# Patient Record
Sex: Male | Born: 1935 | Race: White | Hispanic: No | Smoking: Never smoker
Health system: Southern US, Community
[De-identification: ages and names within clinical notes are randomized; demographics above are authoritative.]

## PROBLEM LIST (undated history)

## (undated) DIAGNOSIS — C801 Malignant (primary) neoplasm, unspecified: Secondary | ICD-10-CM

## (undated) DIAGNOSIS — C439 Malignant melanoma of skin, unspecified: Secondary | ICD-10-CM

## (undated) HISTORY — PX: MELANOMA EXCISION: SHX5266

## (undated) HISTORY — PX: HERNIA REPAIR: SHX51

---
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
## 1998-01-11 HISTORY — PX: CATARACT EXTRACTION: SUR2

## 2003-03-21 ENCOUNTER — Other Ambulatory Visit: Payer: Self-pay

## 2004-02-04 ENCOUNTER — Ambulatory Visit: Payer: Self-pay | Admitting: Internal Medicine

## 2009-05-05 ENCOUNTER — Ambulatory Visit: Payer: Self-pay | Admitting: Urology

## 2010-11-07 ENCOUNTER — Emergency Department: Payer: Self-pay | Admitting: Emergency Medicine

## 2010-11-20 ENCOUNTER — Inpatient Hospital Stay: Payer: Self-pay | Admitting: Student

## 2010-11-27 LAB — PATHOLOGY REPORT

## 2014-09-30 DIAGNOSIS — Z85828 Personal history of other malignant neoplasm of skin: Secondary | ICD-10-CM | POA: Insufficient documentation

## 2014-11-21 DIAGNOSIS — K219 Gastro-esophageal reflux disease without esophagitis: Secondary | ICD-10-CM | POA: Insufficient documentation

## 2014-11-21 DIAGNOSIS — E785 Hyperlipidemia, unspecified: Secondary | ICD-10-CM | POA: Insufficient documentation

## 2018-05-10 ENCOUNTER — Emergency Department: Payer: Medicare Other

## 2018-05-10 ENCOUNTER — Other Ambulatory Visit: Payer: Self-pay

## 2018-05-10 ENCOUNTER — Emergency Department
Admission: EM | Admit: 2018-05-10 | Discharge: 2018-05-10 | Disposition: A | Discharge status: Home / Self Care | Payer: Medicare Other | Attending: Emergency Medicine | Admitting: Emergency Medicine

## 2018-05-10 ENCOUNTER — Encounter: Payer: Self-pay | Admitting: Emergency Medicine

## 2018-05-10 DIAGNOSIS — K5900 Constipation, unspecified: Secondary | ICD-10-CM | POA: Insufficient documentation

## 2018-05-10 DIAGNOSIS — R109 Unspecified abdominal pain: Secondary | ICD-10-CM

## 2018-05-10 DIAGNOSIS — Z85820 Personal history of malignant melanoma of skin: Secondary | ICD-10-CM | POA: Diagnosis not present

## 2018-05-10 DIAGNOSIS — I493 Ventricular premature depolarization: Secondary | ICD-10-CM | POA: Diagnosis not present

## 2018-05-10 LAB — URINALYSIS, COMPLETE (UACMP) WITH MICROSCOPIC
Bacteria, UA: NONE SEEN
Bilirubin Urine: NEGATIVE
Glucose, UA: NEGATIVE mg/dL
Ketones, ur: NEGATIVE mg/dL
Leukocytes,Ua: NEGATIVE
Nitrite: NEGATIVE
Protein, ur: NEGATIVE mg/dL
Specific Gravity, Urine: 1.016 (ref 1.005–1.030)
Squamous Epithelial / HPF: NONE SEEN (ref 0–5)
pH: 6 (ref 5.0–8.0)

## 2018-05-10 LAB — COMPREHENSIVE METABOLIC PANEL
ALT: 16 U/L (ref 0–44)
AST: 24 U/L (ref 15–41)
Albumin: 4.2 g/dL (ref 3.5–5.0)
Alkaline Phosphatase: 89 U/L (ref 38–126)
Anion gap: 9 (ref 5–15)
BUN: 20 mg/dL (ref 8–23)
CO2: 24 mmol/L (ref 22–32)
Calcium: 9 mg/dL (ref 8.9–10.3)
Chloride: 104 mmol/L (ref 98–111)
Creatinine, Ser: 1.51 mg/dL — ABNORMAL HIGH (ref 0.61–1.24)
GFR calc Af Amer: 46 mL/min — ABNORMAL LOW (ref >60)
GFR calc non Af Amer: 40 mL/min — ABNORMAL LOW (ref >60)
Glucose, Bld: 115 mg/dL — ABNORMAL HIGH (ref 70–99)
Potassium: 4.1 mmol/L (ref 3.5–5.1)
Sodium: 137 mmol/L (ref 135–145)
Total Bilirubin: 0.9 mg/dL (ref 0.3–1.2)
Total Protein: 7.2 g/dL (ref 6.5–8.1)

## 2018-05-10 LAB — CBC WITH DIFFERENTIAL/PLATELET
Abs Immature Granulocytes: 0.02 10*3/uL (ref 0.00–0.07)
Basophils Absolute: 0 10*3/uL (ref 0.0–0.1)
Basophils Relative: 0 %
Eosinophils Absolute: 0.1 10*3/uL (ref 0.0–0.5)
Eosinophils Relative: 1 %
HCT: 45.9 % (ref 39.0–52.0)
Hemoglobin: 15 g/dL (ref 13.0–17.0)
Immature Granulocytes: 0 %
Lymphocytes Relative: 39 %
Lymphs Abs: 3.7 10*3/uL (ref 0.7–4.0)
MCH: 31.1 pg (ref 26.0–34.0)
MCHC: 32.7 g/dL (ref 30.0–36.0)
MCV: 95 fL (ref 80.0–100.0)
Monocytes Absolute: 0.7 10*3/uL (ref 0.1–1.0)
Monocytes Relative: 7 %
Neutro Abs: 5.1 10*3/uL (ref 1.7–7.7)
Neutrophils Relative %: 53 %
Platelets: 210 10*3/uL (ref 150–400)
RBC: 4.83 MIL/uL (ref 4.22–5.81)
RDW: 12.4 % (ref 11.5–15.5)
WBC: 9.6 10*3/uL (ref 4.0–10.5)
nRBC: 0 % (ref 0.0–0.2)

## 2018-05-10 LAB — LIPASE, BLOOD: Lipase: 46 U/L (ref 11–51)

## 2018-05-10 LAB — TROPONIN I: Troponin I: <0.03 ng/mL (ref <0.03)

## 2018-05-10 MED ORDER — SODIUM CHLORIDE 0.9 % IV BOLUS
500.0000 mL | Freq: Once | INTRAVENOUS | Status: AC
  Administered 2018-05-10: 16:00:00 500 mL via INTRAVENOUS

## 2018-05-10 MED ORDER — MAGNESIUM SULFATE 2 GM/50ML IV SOLN
2.0000 g | Freq: Once | INTRAVENOUS | Status: AC
  Administered 2018-05-10: 16:00:00 2 g via INTRAVENOUS
  Filled 2018-05-10: qty 50

## 2018-05-10 NOTE — ED Provider Notes (Signed)
[facility] Emergency Department Provider Note  ____________________________________________  Time seen: Approximately 4:03 PM  I have reviewed the triage vital signs and the nursing notes.   HISTORY  Chief Complaint Abdominal Pain   HPI Omar Schroeder is a 85 y.o. male with a history of GERD, hyperlipidemia, melanoma who presents for evaluation of abdominal pain.  Patient describes a sharp intermittent lower abdominal pain in the suprapubic region for 3 days.  He reports that the pain was not improving which prompted her visit to the emergency room.  At this time he has no pain.  He reports that the pain feels like gas.  He denies constipation, diarrhea, nausea, vomiting, dysuria, hematuria, fever, chills.  Denies any abdominal surgeries other than inguinal hernia repair.  No testicular pain or swelling.  No chest pain or shortness of breath.  PMH HLD GERD Melanoma  Past Surgical History:  Procedure Laterality Date  . HERNIA REPAIR    . MELANOMA EXCISION      Allergies Patient has no known allergies.  FH Coronary Artery Disease (Blocked arteries around heart) Father    Breast cancer Mother       Social History Social History   Tobacco Use  . Smoking status: Never Smoker  . Smokeless tobacco: Never Used  Substance Use Topics  . Alcohol use: Never    Frequency: Never  . Drug use: Not on file    Review of Systems  Constitutional: Negative for fever. Eyes: Negative for visual changes. ENT: Negative for sore throat. Neck: No neck pain  Cardiovascular: Negative for chest pain. Respiratory: Negative for shortness of breath. Gastrointestinal: + Suprapubic abdominal pain. No vomiting or diarrhea. Genitourinary: Negative for dysuria. Musculoskeletal: Negative for back pain. Skin: Negative for rash. Neurological: Negative for headaches, weakness or numbness. Psych: No SI or HI  ____________________________________________    PHYSICAL EXAM:  VITAL SIGNS: ED Triage Vitals  Enc Vitals Group     BP 05/10/18 1444 111/76     Pulse Rate 05/10/18 1444 75     Resp 05/10/18 1444 18     Temp 05/10/18 1444 97.8 F (36.6 C)     Temp Source 05/10/18 1444 Oral     SpO2 05/10/18 1444 99 %     Weight 05/10/18 1445 150 lb (68 kg)     Height 05/10/18 1445 5\' 9"  (1.753 m)     Head Circumference --      Peak Flow --      Pain Score 05/10/18 1438 0     Pain Loc --      Pain Edu? --      Excl. in [place]? --     Constitutional: Alert and oriented. Well appearing and in no apparent distress. HEENT:      Head: Normocephalic and atraumatic.         Eyes: Conjunctivae are normal. Sclera is non-icteric.       Mouth/Throat: Mucous membranes are moist.       Neck: Supple with no signs of meningismus. Cardiovascular: Regular rate and rhythm. No murmurs, gallops, or rubs. 2+ symmetrical distal pulses are present in all extremities. No JVD. Respiratory: Normal respiratory effort. Lungs are clear to auscultation bilaterally. No wheezes, crackles, or rhonchi.  Gastrointestinal: Soft, non tender, and non distended with positive bowel sounds. No rebound or guarding. Genitourinary: No CVA tenderness. Bilateral testicles are descended with no tenderness to palpation, bilateral positive cremasteric reflexes are present, no swelling or erythema of the scrotum. No  evidence of inguinal hernia. Musculoskeletal: Nontender with normal range of motion in all extremities. No edema, cyanosis, or erythema of extremities. Neurologic: Normal speech and language. Face is symmetric. Moving all extremities. No gross focal neurologic deficits are appreciated. Skin: Skin is warm, dry and intact. No rash noted. Psychiatric: Mood and affect are normal. Speech and behavior are normal.  ____________________________________________   LABS (all labs ordered are listed, but only abnormal results are displayed)  Labs Reviewed  COMPREHENSIVE METABOLIC PANEL -  Abnormal; Notable for the following components:      Result Value   Glucose, Bld 115 (*)    Creatinine, Ser 1.51 (*)    GFR calc non Af Amer 40 (*)    GFR calc Af Amer 46 (*)    All other components within normal limits  URINALYSIS, COMPLETE (UACMP) WITH MICROSCOPIC - Abnormal; Notable for the following components:   Color, Urine YELLOW (*)    APPearance CLEAR (*)    Hgb urine dipstick SMALL (*)    All other components within normal limits  LIPASE, BLOOD  CBC WITH DIFFERENTIAL/PLATELET  TROPONIN I   ____________________________________________  EKG  ED ECG REPORT I, Rudene Re, the attending physician, personally viewed and interpreted this ECG.  Normal sinus rhythm with frequent PVCs and bigeminy pattern, LAFB, left axis deviation, no ST elevations or depressions.   17:06 -normal sinus rhythm, rate of 91, normal intervals, normal axis, frequent PVCs, no ST elevations or depressions.  Unchanged from initial.  ____________________________________________  RADIOLOGY  I have personally reviewed the images performed during this visit and I agree with the Radiologist's read.   Interpretation by Radiologist:  Dg Abdomen 1 View  Result Date: 05/10/2018 CLINICAL DATA:  Patient reports mid and lower abd pain x 3 days, denies d/n/v, just reports pain. EXAM: ABDOMEN - 1 VIEW COMPARISON:  CT 11/07/2010 FINDINGS: Small bowel decompressed. Moderate colonic fecal material without dilatation. Right pelvic phleboliths. Possible T12 compression deformity, which was not evident on the prior study. IMPRESSION: 1. Nonobstructive bowel gas pattern with moderate colonic fecal material. 2. T12 compression deformity, new since 2012 Electronically Signed   By: Lucrezia Europe M.D.   On: 05/10/2018 16:02     ____________________________________________   PROCEDURES  Procedure(s) performed: None Procedures Critical Care performed:  None ____________________________________________   INITIAL  IMPRESSION / ASSESSMENT AND PLAN / ED COURSE  85 y.o. male with a history of GERD, hyperlipidemia, melanoma who presents for evaluation of intermittent "gas-like" suprapubic abdominal pain x 3 days. No pain at this time.  Patient is well-appearing and in no distress, has normal vital signs, abdomen is soft with no tenderness throughout, normal GU exam.  EKG showing bigeminy PVCs but no evidence of ischemia.  Patient denies any chest pain however troponin was sent to evaluate for ischemia and that is negative.  Magnesium IV was given to help with PVCs, repeat EKG after mag with no changes. Patient asymptomatic, will refer to cardiology.  CBC showing no leukocytosis.  CMP shows stable creatinine at 1.5 with normal electrolytes, normal liver enzymes, normal lipase. UA showed no evidence of UTI. KUB showing moderate stool burden.  DDX constipation vs kidney stone.  Low suspicion for diverticulitis, appendicitis, SBO with intermittent symptoms and no tenderness throughout in his abdomen at this time.   Patient was monitored for an hour in the emergency room while receiving IV fluids and IV magnesium with no recurrence of the pain.  Will discharge home on senna and Colace standing and milk  of magnesia for the next 3 days.  Recommended follow-up with cardiology for his frequent PVCs, follow-up with primary care doctor for abdominal pain.  Discussed my standard return precautions peer   As part of my medical decision making, I reviewed the following data within the [facility] notes reviewed and incorporated, Labs reviewed , EKG interpreted , Old chart reviewed, Radiograph reviewed , Notes from prior ED visits and [place] Controlled Substance Database    Pertinent labs & imaging results that were available during my care of the patient were reviewed by me and considered in my medical decision making (see chart for details).    ____________________________________________   FINAL  CLINICAL IMPRESSION(S) / ED DIAGNOSES  Final diagnoses:  Abdominal pain, unspecified abdominal location  Constipation, unspecified constipation type  Frequent PVCs      NEW MEDICATIONS STARTED DURING THIS VISIT:  ED Discharge Orders    None       Note:  This document was prepared using Dragon voice recognition software and may include unintentional dictation errors.    Alfred Levins, [place], MD 05/10/18 [phone]

## 2018-05-10 NOTE — ED Triage Notes (Signed)
Mid abd pain for 3d ays.

## 2018-05-10 NOTE — ED Notes (Signed)
Labs drawn and sent. Awaiting md eval and plan of care.

## 2018-05-10 NOTE — ED Notes (Signed)
Reports abd pain x 3 days, denies d/n/v, just pain.

## 2018-05-10 NOTE — Discharge Instructions (Addendum)
Constipation: Take colace twice a day everyday. Take senna once a day at bedtime. Take daily probiotics. Drink plenty of fluids and eat a diet rich in fiber. For the next 3 days take 3600mg  or 53ml of 400mg /66ml Milk of Magnesia at bedtime.    You have been seen in the Emergency Department (ED) for abdominal pain.  Your evaluation did not identify a clear cause of your symptoms but was generally reassuring.  Abdominal pain has many possible causes. Some aren't serious and get better on their own in a few days. Others need more testing and treatment. If your pain continues or gets worse, you need to be rechecked and may need more tests to find out what is wrong. You may need surgery to correct the problem.   Follow up with your doctor in 12-24 hours if you are still having abdominal pain. Otherwise follow up in 1-3 days for a re-check  Don't ignore new symptoms, such as fever, nausea and vomiting, new or worsening abdominal pain, urination problems, bloody diarrhea or bloody stools, black tarry stools, uncontrollable nausea and vomiting, and dizziness. These may be signs of a more serious problem. If you develop any of these you should be seen by your doctor immediately or return to the ED.   How can you care for yourself at home?  Rest until you feel better.  To prevent dehydration, drink plenty of fluids, enough so that your urine is light yellow or clear like water. Choose water and other caffeine-free clear liquids until you feel better. If you have kidney, heart, or liver disease and have to limit fluids, talk with your doctor before you increase the amount of fluids you drink.  If your stomach is upset, eat mild foods, such as rice, dry toast or crackers, bananas, and applesauce. Try eating several small meals instead of two or three large ones.  Wait until 48 hours after all symptoms have gone away before you have spicy foods, alcohol, and drinks that contain caffeine.  Do not eat foods that  are high in fat.  Avoid anti-inflammatory medicines such as aspirin, ibuprofen (Advil, Motrin), and naproxen (Aleve). These can cause stomach upset. Talk to your doctor if you take daily aspirin for another health problem.  When should you call for help?  Call 911 anytime you think you may need emergency care. For example, call if:  You passed out (lost consciousness).  You pass maroon or very bloody stools.  You vomit blood or what looks like coffee grounds.  You have new, severe belly pain.  Call your doctor now or seek immediate medical care if:  Your pain gets worse, especially if it becomes focused in one area of your belly.  You have a new or higher fever.  Your stools are black and look like tar, or they have streaks of blood.  You have unexpected vaginal bleeding.  You have symptoms of a urinary tract infection. These may include:  Pain when you urinate.  Urinating more often than usual.  Blood in your urine. You are dizzy or lightheaded, or you feel like you may faint. Watch closely for changes in your health, and be sure to contact your doctor if:  You are not getting better after 1 day (24 hours).

## 2018-12-26 ENCOUNTER — Other Ambulatory Visit: Payer: Self-pay

## 2018-12-26 ENCOUNTER — Emergency Department: Payer: Medicare Other

## 2018-12-26 ENCOUNTER — Emergency Department
Admission: EM | Admit: 2018-12-26 | Discharge: 2018-12-26 | Disposition: A | Discharge status: Home / Self Care | Payer: Medicare Other | Attending: Emergency Medicine | Admitting: Emergency Medicine

## 2018-12-26 DIAGNOSIS — R109 Unspecified abdominal pain: Secondary | ICD-10-CM | POA: Diagnosis present

## 2018-12-26 DIAGNOSIS — M545 Low back pain, unspecified: Secondary | ICD-10-CM

## 2018-12-26 LAB — COMPREHENSIVE METABOLIC PANEL
ALT: 15 U/L (ref 0–44)
AST: 24 U/L (ref 15–41)
Albumin: 4.9 g/dL (ref 3.5–5.0)
Alkaline Phosphatase: 111 U/L (ref 38–126)
Anion gap: 11 (ref 5–15)
BUN: 18 mg/dL (ref 8–23)
CO2: 25 mmol/L (ref 22–32)
Calcium: 9.5 mg/dL (ref 8.9–10.3)
Chloride: 102 mmol/L (ref 98–111)
Creatinine, Ser: 1.42 mg/dL — ABNORMAL HIGH (ref 0.61–1.24)
GFR calc Af Amer: 49 mL/min — ABNORMAL LOW (ref >60)
GFR calc non Af Amer: 43 mL/min — ABNORMAL LOW (ref >60)
Glucose, Bld: 111 mg/dL — ABNORMAL HIGH (ref 70–99)
Potassium: 3.7 mmol/L (ref 3.5–5.1)
Sodium: 138 mmol/L (ref 135–145)
Total Bilirubin: 1 mg/dL (ref 0.3–1.2)
Total Protein: 8.2 g/dL — ABNORMAL HIGH (ref 6.5–8.1)

## 2018-12-26 LAB — CBC
HCT: 45.8 % (ref 39.0–52.0)
Hemoglobin: 15.5 g/dL (ref 13.0–17.0)
MCH: 30.7 pg (ref 26.0–34.0)
MCHC: 33.8 g/dL (ref 30.0–36.0)
MCV: 90.7 fL (ref 80.0–100.0)
Platelets: 206 10*3/uL (ref 150–400)
RBC: 5.05 MIL/uL (ref 4.22–5.81)
RDW: 12.1 % (ref 11.5–15.5)
WBC: 8.2 10*3/uL (ref 4.0–10.5)
nRBC: 0 % (ref 0.0–0.2)

## 2018-12-26 LAB — URINALYSIS, COMPLETE (UACMP) WITH MICROSCOPIC
Bacteria, UA: NONE SEEN
Bilirubin Urine: NEGATIVE
Glucose, UA: NEGATIVE mg/dL
Ketones, ur: NEGATIVE mg/dL
Leukocytes,Ua: NEGATIVE
Nitrite: NEGATIVE
Protein, ur: NEGATIVE mg/dL
Specific Gravity, Urine: 1.021 (ref 1.005–1.030)
pH: 5 (ref 5.0–8.0)

## 2018-12-26 LAB — LIPASE, BLOOD: Lipase: 53 U/L — ABNORMAL HIGH (ref 11–51)

## 2018-12-26 MED ORDER — SODIUM CHLORIDE 0.9% FLUSH: 3.0000 mL | Freq: Once | INTRAVENOUS | Status: DC

## 2018-12-26 NOTE — ED Provider Notes (Signed)
[facility] Emergency Department Provider Note ____________________________________________   First MD Initiated Contact with Patient 12/26/18 1356     (approximate)  I have reviewed the triage vital signs and the nursing notes.   HISTORY  Chief Complaint Flank Pain    HPI Omar Schroeder is a 85 y.o. male with PMH as noted below who presents with bilateral low back pain which to me he states is not worse on one side or the other.  It has been present over the last few days, gradual onset, and occurs intermittently.  It does not radiate.  He denies any fever chills, vomiting, diarrhea, abdominal pain, dysuria or hematuria, or leg pain.  He has had no trauma.  He has been taking Tylenol with some relief.  History reviewed. No pertinent past medical history.  There are no problems to display for this patient.   Past Surgical History:  Procedure Laterality Date  . HERNIA REPAIR    . MELANOMA EXCISION      Prior to Admission medications   Not on File    Allergies Patient has no known allergies.  No family history on file.  Social History Social History   Tobacco Use  . Smoking status: Never Smoker  . Smokeless tobacco: Never Used  Substance Use Topics  . Alcohol use: Never  . Drug use: Not on file    Review of Systems  Constitutional: No fever. Eyes: No redness. ENT: No sore throat. Cardiovascular: Denies chest pain. Respiratory: Denies shortness of breath. Gastrointestinal: No vomiting or diarrhea.  Genitourinary: Negative for dysuria or hematuria.  Musculoskeletal: Positive for back pain. Skin: Negative for rash. Neurological: Negative for headaches, focal weakness or numbness.   ____________________________________________   PHYSICAL EXAM:  VITAL SIGNS: ED Triage Vitals [12/26/18 1248]  Enc Vitals Group     BP (!) 168/77     Pulse Rate (!) 59     Resp 18     Temp 97.8 F (36.6 C)     Temp Source Oral     SpO2  100 %     Weight 170 lb (77.1 kg)     Height 5\' 9"  (1.753 m)     Head Circumference      Peak Flow      Pain Score 8     Pain Loc      Pain Edu?      Excl. in [place]?     Constitutional: Alert and oriented. Well appearing for age and in no acute distress. Eyes: Conjunctivae are normal.  Head: Atraumatic. Nose: No congestion/rhinnorhea. Mouth/Throat: Mucous membranes are moist.   Neck: Normal range of motion.  Cardiovascular: Good peripheral circulation. Respiratory: Normal respiratory effort.  No retractions. Gastrointestinal: Soft and nontender. No distention.  Genitourinary: No flank or CVA tenderness. Musculoskeletal: No lower extremity edema.  Extremities warm and well perfused.  No midline spinal tenderness. Neurologic:  Normal speech and language.  Motor and sensory intact in all extremities.  Normal gait.   Skin:  Skin is warm and dry. No rash noted. Psychiatric: Mood and affect are normal. Speech and behavior are normal.  ____________________________________________   LABS (all labs ordered are listed, but only abnormal results are displayed)  Labs Reviewed  LIPASE, BLOOD - Abnormal; Notable for the following components:      Result Value   Lipase 53 (*)    All other components within normal limits  COMPREHENSIVE METABOLIC PANEL - Abnormal; Notable for the following components:  Glucose, Bld 111 (*)    Creatinine, Ser 1.42 (*)    Total Protein 8.2 (*)    GFR calc non Af Amer 43 (*)    GFR calc Af Amer 49 (*)    All other components within normal limits  URINALYSIS, COMPLETE (UACMP) WITH MICROSCOPIC - Abnormal; Notable for the following components:   Color, Urine YELLOW (*)    APPearance CLEAR (*)    Hgb urine dipstick SMALL (*)    All other components within normal limits  CBC   ____________________________________________  EKG   ____________________________________________  RADIOLOGY  CT abdomen/pelvis: No ureteral stones or other acute  abnormality  ____________________________________________   PROCEDURES  Procedure(s) performed: No  Procedures  Critical Care performed: No ____________________________________________   INITIAL IMPRESSION / ASSESSMENT AND PLAN / ED COURSE  Pertinent labs & imaging results that were available during my care of the patient were reviewed by me and considered in my medical decision making (see chart for details).  85 year old male with PMH as noted above presents with bilateral lower back pain over the last several days which is intermittent and has no associated symptoms.  The patient specifically denies any GI or urinary symptoms.  On exam, he appears very well for his age.  His vital signs are normal except for mild hypertension.  He has no significant midline or paraspinal tenderness, and normal neurologic exam, and normal gait.  Overall presentation is most consistent with musculoskeletal cause.  The patient's lab work-up is unremarkable.  Lipase is minimally elevated although he has no abdominal pain, nausea, or other symptoms to suggest pancreatitis.  Urinalysis shows some WBCs, which raises the possibility of kidney stone.  However this would not really be consistent with bilateral pain.  I will order a CT and reassess.  ----------------------------------------- 3:02 PM on 12/26/2018 -----------------------------------------  CT shows no ureteral stones or other acute abnormality, other than slightly increased stool burden.  I continue to suspect most likely musculoskeletal pain versus possible referred pain related to constipation.  Patient is stable for discharge home.  He feels comfortable going home.  He has not required any pain medication here, and states he is fine with just continuing Tylenol at home as needed.  I counseled him on the results of the work-up.  I gave him very thorough return precautions and he expressed understanding.    ____________________________________________   FINAL CLINICAL IMPRESSION(S) / ED DIAGNOSES  Final diagnoses:  Acute bilateral low back pain without sciatica      NEW MEDICATIONS STARTED DURING THIS VISIT:  New Prescriptions   No medications on file     Note:  This document was prepared using Dragon voice recognition software and may include unintentional dictation errors.    Arta Silence, MD 12/26/18 1526

## 2018-12-26 NOTE — ED Triage Notes (Signed)
Pt here c/o flank pain. States it is worse on the left side. Also states it aches when he voids. Denies history of kidney stone.

## 2018-12-26 NOTE — Discharge Instructions (Addendum)
Return to the ER for new, worsening, or persistent severe back pain, fever, weakness, difficulty walking, abdominal pain, vomiting, problems urinating, or any other new or worsening symptoms that concern you.

## 2019-01-08 ENCOUNTER — Emergency Department: Payer: Medicare Other

## 2019-01-08 ENCOUNTER — Other Ambulatory Visit: Payer: Self-pay

## 2019-01-08 ENCOUNTER — Emergency Department
Admission: EM | Admit: 2019-01-08 | Discharge: 2019-01-08 | Disposition: A | Discharge status: Home / Self Care | Payer: Medicare Other | Attending: Student | Admitting: Student

## 2019-01-08 ENCOUNTER — Encounter: Payer: Self-pay | Admitting: Emergency Medicine

## 2019-01-08 DIAGNOSIS — R109 Unspecified abdominal pain: Secondary | ICD-10-CM | POA: Diagnosis not present

## 2019-01-08 DIAGNOSIS — M549 Dorsalgia, unspecified: Secondary | ICD-10-CM | POA: Diagnosis not present

## 2019-01-08 DIAGNOSIS — M545 Low back pain: Secondary | ICD-10-CM | POA: Diagnosis present

## 2019-01-08 LAB — COMPREHENSIVE METABOLIC PANEL
ALT: 21 U/L (ref 0–44)
AST: 25 U/L (ref 15–41)
Albumin: 4 g/dL (ref 3.5–5.0)
Alkaline Phosphatase: 89 U/L (ref 38–126)
Anion gap: 9 (ref 5–15)
BUN: 20 mg/dL (ref 8–23)
CO2: 24 mmol/L (ref 22–32)
Calcium: 9.3 mg/dL (ref 8.9–10.3)
Chloride: 106 mmol/L (ref 98–111)
Creatinine, Ser: 1.39 mg/dL — ABNORMAL HIGH (ref 0.61–1.24)
GFR calc Af Amer: 51 mL/min — ABNORMAL LOW (ref >60)
GFR calc non Af Amer: 44 mL/min — ABNORMAL LOW (ref >60)
Glucose, Bld: 102 mg/dL — ABNORMAL HIGH (ref 70–99)
Potassium: 5.1 mmol/L (ref 3.5–5.1)
Sodium: 139 mmol/L (ref 135–145)
Total Bilirubin: 0.5 mg/dL (ref 0.3–1.2)
Total Protein: 7 g/dL (ref 6.5–8.1)

## 2019-01-08 LAB — CBC WITH DIFFERENTIAL/PLATELET
Abs Immature Granulocytes: 0.02 10*3/uL (ref 0.00–0.07)
Basophils Absolute: 0 10*3/uL (ref 0.0–0.1)
Basophils Relative: 0 %
Eosinophils Absolute: 0.1 10*3/uL (ref 0.0–0.5)
Eosinophils Relative: 1 %
HCT: 42.4 % (ref 39.0–52.0)
Hemoglobin: 14.2 g/dL (ref 13.0–17.0)
Immature Granulocytes: 0 %
Lymphocytes Relative: 25 %
Lymphs Abs: 2 10*3/uL (ref 0.7–4.0)
MCH: 30.7 pg (ref 26.0–34.0)
MCHC: 33.5 g/dL (ref 30.0–36.0)
MCV: 91.6 fL (ref 80.0–100.0)
Monocytes Absolute: 0.5 10*3/uL (ref 0.1–1.0)
Monocytes Relative: 6 %
Neutro Abs: 5.4 10*3/uL (ref 1.7–7.7)
Neutrophils Relative %: 68 %
Platelets: 226 10*3/uL (ref 150–400)
RBC: 4.63 MIL/uL (ref 4.22–5.81)
RDW: 12.5 % (ref 11.5–15.5)
WBC: 8 10*3/uL (ref 4.0–10.5)
nRBC: 0 % (ref 0.0–0.2)

## 2019-01-08 LAB — URINALYSIS, COMPLETE (UACMP) WITH MICROSCOPIC
Bacteria, UA: NONE SEEN
Bilirubin Urine: NEGATIVE
Glucose, UA: NEGATIVE mg/dL
Hgb urine dipstick: NEGATIVE
Ketones, ur: NEGATIVE mg/dL
Leukocytes,Ua: NEGATIVE
Nitrite: NEGATIVE
Protein, ur: 30 mg/dL — AB
Specific Gravity, Urine: 1.031 — ABNORMAL HIGH (ref 1.005–1.030)
pH: 5 (ref 5.0–8.0)

## 2019-01-08 MED ORDER — ACETAMINOPHEN 500 MG PO TABS
1000.0000 mg | ORAL_TABLET | Freq: Once | ORAL | Status: AC
  Administered 2019-01-08: 18:00:00 1000 mg via ORAL
  Filled 2019-01-08: qty 2

## 2019-01-08 MED ORDER — IBUPROFEN 600 MG PO TABS
600.0000 mg | ORAL_TABLET | Freq: Once | ORAL | Status: AC
  Administered 2019-01-08: 600 mg via ORAL
  Filled 2019-01-08: qty 1

## 2019-01-08 NOTE — Discharge Instructions (Addendum)
Thank you for letting us take care of you in the emergency department today.   Please continue to take any regular, prescribed medications.  Continue to take Tylenol, as directed on the bottle as needed for aches and pains.  You can obtain over-the-counter lidocaine patches under the name SALONPAS and use as directed to further help with your pain.  Place the patch on the area of pain, you can leave it on for up to 12 hours at a time.  Please follow up with: - Your primary care doctor to review your ER visit and follow up on your symptoms.   Please return to the ER for any new or worsening symptoms.

## 2019-01-08 NOTE — ED Triage Notes (Signed)
Pt here with c/o right flank pain, does not radiate, states pain began last pm, denies pain with urination, denies injury to area. NAD. Walked with no issues to triage.

## 2019-01-08 NOTE — ED Provider Notes (Signed)
[facility] Emergency Department Provider Note  ____________________________________________   First MD Initiated Contact with Patient 01/08/19 1737     (approximate)  I have reviewed the triage vital signs and the nursing notes.  History  Chief Complaint Back Pain    HPI Omar Schroeder is a 85 y.o. male with no significant medical history who presents emergency department for right lower back pain.  Pain is located to the right lower back/flank area.  Described as dull.  Constant since onset yesterday.  Currently 8/10 in severity.  No radiation.  Alleviated with Tylenol.  No aggravating components.  He denies any trauma, recent heavy lifting, or identifiable triggers of his pain.  He denies any associated dysuria, hematuria, or issues with bowel or bladder control.  No lower extremity weakness, numbness, tingling.  He has been taking Tylenol with moderate improvement in his symptoms.  On chart review, patient was seen and evaluated recently in the ED on 12/15 for bilateral lower back pain.  Work-up at that time including basic labs, urine, CT renal was negative.  Symptoms attributed to likely musculoskeletal etiology, which resolved after several days.   Past Medical Hx History reviewed. No pertinent past medical history.  Problem List There are no problems to display for this patient.   Past Surgical Hx Past Surgical History:  Procedure Laterality Date  . HERNIA REPAIR    . MELANOMA EXCISION      Medications Prior to Admission medications   Not on File    Allergies Patient has no known allergies.  Family Hx No family history on file.  Social Hx Social History   Tobacco Use  . Smoking status: Never Smoker  . Smokeless tobacco: Never Used  Substance Use Topics  . Alcohol use: Never  . Drug use: Not on file     Review of Systems  Constitutional: Negative for fever, chills. Eyes: Negative for visual changes. ENT: Negative  for sore throat. Cardiovascular: Negative for chest pain. Respiratory: Negative for shortness of breath. Gastrointestinal: Negative for nausea, vomiting.  Genitourinary: Negative for dysuria. Musculoskeletal: Negative for leg swelling. + RIGHT lower back pain Skin: Negative for rash. Neurological: Negative for for headaches.   Physical Exam  Vital Signs: ED Triage Vitals  Enc Vitals Group     BP 01/08/19 1342 138/66     Pulse Rate 01/08/19 1342 (!) 46     Resp 01/08/19 1342 20     Temp 01/08/19 1342 97.7 F (36.5 C)     Temp Source 01/08/19 1342 Oral     SpO2 01/08/19 1342 99 %     Weight 01/08/19 1328 175 lb (79.4 kg)     Height 01/08/19 1328 5\' 9"  (1.753 m)     Head Circumference --      Peak Flow --      Pain Score 01/08/19 1328 8     Pain Loc --      Pain Edu? --      Excl. in [place]? --     Constitutional: Alert and oriented.  Head: Normocephalic. Atraumatic. Eyes: Conjunctivae clear. Sclera anicteric. Nose: No congestion. No rhinorrhea. Mouth/Throat: Wearing mask.  Neck: No stridor.   Cardiovascular: Mild bradycardia, regular rhythm. Extremities well perfused. Respiratory: Normal respiratory effort.  Lungs CTAB. Gastrointestinal: Soft. Non-tender. Non-distended.  No CVA tenderness. Musculoskeletal: No lower extremity edema. No deformities. Neurologic:  Normal speech and language. No gross focal neurologic deficits are appreciated.  BLE strength 5/5 and symmetric.  Sensation intact  to light touch.  Ambulatory with steady gait. Skin: Skin is warm, dry and intact. No rash noted. Psychiatric: Mood and affect are appropriate for situation.    Radiology  CT: IMPRESSION:  1. Chronic elevation of the RIGHT hemidiaphragm. The hepatic flexure  of the colon extends anterior to the liver. The cecum is at the  level of the gallbladder. Chronic findings.  2. No nephrolithiasis, ureterolithiasis, or obstructive uropathy.  3. Sigmoid colon diverticulosis without evidence  acute  diverticulitis.  4. Aortic Atherosclerosis (ICD10-I70.0).  5. Prostate enlargement    Procedures  Procedure(s) performed (including critical care):  Procedures   Initial Impression / Assessment and Plan / ED Course  85 y.o. male who presents to the ED for right lower back pain, as above.  Ddx: MSK, nephrolithiasis, sciatica, UTI.  Doubt acute aortic pathology, as he has no associated for chest or abdominal pain, not ripping or tearing in description, no associated neurological symptoms.  Doubt acute cord pathology, given no associated neurological symptoms, no issues with bowel or bladder control, normal neurological exam.  Labs without actionable derangements.  Creatinine mildly elevated at 1.39, but at baseline.  UA not suggestive of infection.  CT scan without acute findings.  Patient reports feeling improved after dose of ibuprofen and Tylenol.  Suspect likely musculoskeletal etiology.  Patient otherwise stable for discharge, advised supportive care, PCP follow-up, given return precautions.  Patient voices understanding and is comfortable to plan at discharge.   Final Clinical Impression(s) / ED Diagnosis  Final diagnoses:  Right flank pain  Musculoskeletal back pain       Note:  This document was prepared using Dragon voice recognition software and may include unintentional dictation errors.   Lilia Pro., MD 01/08/19 2035

## 2019-05-11 ENCOUNTER — Emergency Department: Payer: Medicare Other

## 2019-05-11 ENCOUNTER — Other Ambulatory Visit: Payer: Self-pay

## 2019-05-11 ENCOUNTER — Emergency Department
Admission: EM | Admit: 2019-05-11 | Discharge: 2019-05-11 | Disposition: A | Discharge status: Home / Self Care | Payer: Medicare Other | Attending: Emergency Medicine | Admitting: Emergency Medicine

## 2019-05-11 DIAGNOSIS — C799 Secondary malignant neoplasm of unspecified site: Secondary | ICD-10-CM | POA: Insufficient documentation

## 2019-05-11 DIAGNOSIS — N390 Urinary tract infection, site not specified: Secondary | ICD-10-CM | POA: Insufficient documentation

## 2019-05-11 DIAGNOSIS — R109 Unspecified abdominal pain: Secondary | ICD-10-CM

## 2019-05-11 LAB — CBC WITH DIFFERENTIAL/PLATELET
Abs Immature Granulocytes: 0.02 10*3/uL (ref 0.00–0.07)
Basophils Absolute: 0 10*3/uL (ref 0.0–0.1)
Basophils Relative: 0 %
Eosinophils Absolute: 0.2 10*3/uL (ref 0.0–0.5)
Eosinophils Relative: 2 %
HCT: 41 % (ref 39.0–52.0)
Hemoglobin: 13.3 g/dL (ref 13.0–17.0)
Immature Granulocytes: 0 %
Lymphocytes Relative: 24 %
Lymphs Abs: 1.9 10*3/uL (ref 0.7–4.0)
MCH: 30.9 pg (ref 26.0–34.0)
MCHC: 32.4 g/dL (ref 30.0–36.0)
MCV: 95.3 fL (ref 80.0–100.0)
Monocytes Absolute: 0.6 10*3/uL (ref 0.1–1.0)
Monocytes Relative: 7 %
Neutro Abs: 5.1 10*3/uL (ref 1.7–7.7)
Neutrophils Relative %: 67 %
Platelets: 213 10*3/uL (ref 150–400)
RBC: 4.3 MIL/uL (ref 4.22–5.81)
RDW: 12 % (ref 11.5–15.5)
WBC: 7.8 10*3/uL (ref 4.0–10.5)
nRBC: 0 % (ref 0.0–0.2)

## 2019-05-11 LAB — COMPREHENSIVE METABOLIC PANEL
ALT: 24 U/L (ref 0–44)
AST: 27 U/L (ref 15–41)
Albumin: 4 g/dL (ref 3.5–5.0)
Alkaline Phosphatase: 113 U/L (ref 38–126)
Anion gap: 11 (ref 5–15)
BUN: 29 mg/dL — ABNORMAL HIGH (ref 8–23)
CO2: 21 mmol/L — ABNORMAL LOW (ref 22–32)
Calcium: 9 mg/dL (ref 8.9–10.3)
Chloride: 108 mmol/L (ref 98–111)
Creatinine, Ser: 1.3 mg/dL — ABNORMAL HIGH (ref 0.61–1.24)
GFR calc Af Amer: 55 mL/min — ABNORMAL LOW (ref >60)
GFR calc non Af Amer: 47 mL/min — ABNORMAL LOW (ref >60)
Glucose, Bld: 98 mg/dL (ref 70–99)
Potassium: 4.3 mmol/L (ref 3.5–5.1)
Sodium: 140 mmol/L (ref 135–145)
Total Bilirubin: 0.7 mg/dL (ref 0.3–1.2)
Total Protein: 7.1 g/dL (ref 6.5–8.1)

## 2019-05-11 LAB — URINALYSIS, COMPLETE (UACMP) WITH MICROSCOPIC
Bilirubin Urine: NEGATIVE
Glucose, UA: NEGATIVE mg/dL
Ketones, ur: NEGATIVE mg/dL
Nitrite: NEGATIVE
Protein, ur: NEGATIVE mg/dL
Specific Gravity, Urine: 1.034 — ABNORMAL HIGH (ref 1.005–1.030)
pH: 5 (ref 5.0–8.0)

## 2019-05-11 MED ORDER — SODIUM CHLORIDE 0.9 % IV SOLN
1.0000 g | Freq: Once | INTRAVENOUS | Status: AC
  Administered 2019-05-11: 1 g via INTRAVENOUS
  Filled 2019-05-11: qty 10

## 2019-05-11 MED ORDER — CEPHALEXIN 500 MG PO CAPS: 500.0000 mg | ORAL_CAPSULE | Freq: Four times a day (QID) | ORAL | 0 refills | Status: AC

## 2019-05-11 NOTE — Discharge Instructions (Signed)
As we discussed your imaging was concerning for metastatic disease. Please follow up with Dr. Janese Banks and the oncology team. Please seek medical attention for any high fevers, chest pain, shortness of breath, change in behavior, persistent vomiting, bloody stool or any other new or concerning symptoms.

## 2019-05-11 NOTE — ED Notes (Signed)
Pt c/o lower rt back pain x 2 days. Pt denies any chronic medical conditions, injury, or bowel/bladder changes.

## 2019-05-11 NOTE — ED Triage Notes (Signed)
Right sided flank pain X 2-3 days. Denies urinary sx, NVD. Pt alert and oriented X4, cooperative, RR even and unlabored, color WNL. Pt in NAD. Able to ambulte

## 2019-05-11 NOTE — ED Provider Notes (Signed)
[facility] Emergency Department Provider Note  ____________________________________________   I have reviewed the triage vital signs and the nursing notes.   HISTORY  Chief Complaint Flank Pain   History limited by: Not Limited   HPI Omar Schroeder is a 86 y.o. male who presents to the emergency department today because of concerns for right flank pain.  He states the pain started 2 days ago.  It has been fairly constant since then.  It does not seem to be getting worse.  Nothing he does makes the pain better or worse.  He has not noticed any change in urination or defecation.  This is not been associate with any nausea or vomiting.  He denies any trauma.  Denies any rashes.  Denies similar symptoms in the past.   Records reviewed. Per medical record review patient has a history of hernia repair.   History reviewed. No pertinent past medical history.  There are no problems to display for this patient.   Past Surgical History:  Procedure Laterality Date  . HERNIA REPAIR    . MELANOMA EXCISION      Prior to Admission medications   Not on File    Allergies Patient has no known allergies.  No family history on file.  Social History Social History   Tobacco Use  . Smoking status: Never Smoker  . Smokeless tobacco: Never Used  Substance Use Topics  . Alcohol use: Never  . Drug use: Not on file    Review of Systems Constitutional: No fever/chills Eyes: No visual changes. ENT: No sore throat. Cardiovascular: Denies chest pain. Respiratory: Denies shortness of breath. Gastrointestinal: Positive for right flank pain.   Genitourinary: Negative for dysuria. Musculoskeletal: Negative for back pain. Skin: Negative for rash. Neurological: Negative for headaches, focal weakness or numbness.  ____________________________________________   PHYSICAL EXAM:  VITAL SIGNS: ED Triage Vitals  Enc Vitals Group     BP 05/11/19 1031 (!) 181/61      Pulse Rate 05/11/19 1031 72     Resp 05/11/19 1031 18     Temp 05/11/19 1031 97.7 F (36.5 C)     Temp Source 05/11/19 1031 Oral     SpO2 05/11/19 1031 93 %     Weight 05/11/19 1042 197 lb (89.4 kg)     Height 05/11/19 1031 5\' 9"  (1.753 m)     Head Circumference --      Peak Flow --      Pain Score 05/11/19 1042 8   Constitutional: Alert and oriented.  Eyes: Conjunctivae are normal.  ENT      Head: Normocephalic and atraumatic.      Nose: No congestion/rhinnorhea.      Mouth/Throat: Mucous membranes are moist.      Neck: No stridor. Hematological/Lymphatic/Immunilogical: No cervical lymphadenopathy. Cardiovascular: Normal rate, regular rhythm.  No murmurs, rubs, or gallops.  Respiratory: Normal respiratory effort without tachypnea nor retractions. Breath sounds are clear and equal bilaterally. No wheezes/rales/rhonchi. Gastrointestinal: Soft and non tender. No rebound. No guarding.  Genitourinary: Deferred Musculoskeletal: Normal range of motion in all extremities. No lower extremity edema. Neurologic:  Normal speech and language. No gross focal neurologic deficits are appreciated.  Skin:  Skin is warm, dry and intact. No rash noted. Psychiatric: Mood and affect are normal. Speech and behavior are normal. Patient exhibits appropriate insight and judgment.  ____________________________________________    LABS (pertinent positives/negatives)  CBC wbc 7.8, hgb 13.3, plt 213 CMP wnl except co2 21, bun 29,  cr 1.30 UA hazy, moderate hgb dipstick, moderate leukocytes, 21-50 rbc and wbc, rare bacteria  ____________________________________________   EKG  None  ____________________________________________    RADIOLOGY  CT renal No ureteral calculi. Liver lesions and lung lesion  ____________________________________________   PROCEDURES  Procedures  ____________________________________________   INITIAL IMPRESSION / ASSESSMENT AND PLAN / ED COURSE  Pertinent  labs & imaging results that were available during my care of the patient were reviewed by me and considered in my medical decision making (see chart for details).   Patient presented to the emergency department today because of concerns for right flank pain.  On exam patient without a rash.  No abdominal tenderness.  Urine did show some blood and white blood cells.  I did obtain a CT renal to evaluate for kidney stone.  While this did not show kidney stone to did show lesions in the lung and the liver.  I discussed this with the patient and likelihood of metastatic cancer.  Terms of the flank pain however I do think this could be related to urinary tract infection.  No signs concerning for sepsis at this time.  Will give patient dose of IV antibiotics here and discharge with further antibiotics.  Discussed return precautions with the patient.  ____________________________________________   FINAL CLINICAL IMPRESSION(S) / ED DIAGNOSES  Final diagnoses:  Right flank pain  Lower urinary tract infectious disease  Multiple lesions of metastatic malignancy [facility])     Note: This dictation was prepared with Dragon dictation. Any transcriptional errors that result from this process are unintentional     Nance Pear, MD 05/11/19 1454

## 2019-05-15 ENCOUNTER — Other Ambulatory Visit: Payer: Self-pay

## 2019-05-15 ENCOUNTER — Inpatient Hospital Stay: Payer: Medicare Other | Attending: Oncology | Admitting: Oncology

## 2019-05-15 ENCOUNTER — Inpatient Hospital Stay: Payer: Medicare Other

## 2019-05-15 VITALS — BP 174/64 | HR 52 | Temp 96.1°F | Resp 20 | Ht 69.0 in | Wt 173.0 lb

## 2019-05-15 DIAGNOSIS — C801 Malignant (primary) neoplasm, unspecified: Secondary | ICD-10-CM | POA: Insufficient documentation

## 2019-05-15 DIAGNOSIS — K219 Gastro-esophageal reflux disease without esophagitis: Secondary | ICD-10-CM | POA: Insufficient documentation

## 2019-05-15 DIAGNOSIS — C787 Secondary malignant neoplasm of liver and intrahepatic bile duct: Secondary | ICD-10-CM | POA: Diagnosis not present

## 2019-05-15 DIAGNOSIS — K769 Liver disease, unspecified: Secondary | ICD-10-CM

## 2019-05-15 DIAGNOSIS — R918 Other nonspecific abnormal finding of lung field: Secondary | ICD-10-CM

## 2019-05-15 DIAGNOSIS — I700 Atherosclerosis of aorta: Secondary | ICD-10-CM | POA: Insufficient documentation

## 2019-05-15 DIAGNOSIS — E785 Hyperlipidemia, unspecified: Secondary | ICD-10-CM | POA: Diagnosis not present

## 2019-05-15 DIAGNOSIS — Z85820 Personal history of malignant melanoma of skin: Secondary | ICD-10-CM | POA: Diagnosis not present

## 2019-05-15 DIAGNOSIS — K5730 Diverticulosis of large intestine without perforation or abscess without bleeding: Secondary | ICD-10-CM | POA: Insufficient documentation

## 2019-05-15 NOTE — Progress Notes (Signed)
Patient here today for initial evaluation regarding metastatic lesions found on CT scan in ER.

## 2019-05-18 ENCOUNTER — Encounter: Payer: Self-pay | Admitting: Oncology

## 2019-05-18 NOTE — Progress Notes (Signed)
Hematology/Oncology Consult note [facility] [phone] [phone]  Patient Care Team: [place] Pink, MD as PCP - General (Family Medicine)   Name of the patient: Omar Schroeder  [id number]  1924-12-26    Reason for referral-liver metastases.  Unknown primary   Referring physician-Dr. Archie Balboa Date of visit: 05/18/19   History of presenting illness- Patient is an [age] year old male who lives alone and is independent of his ADLs.  He has no significant comorbidities other than hyperlipidemia and GERD.  He has a history of superficial skin cancer.  He presented to the ER with symptoms of right-sided flank pain and had a CT renal stone study which showed a 2.1 x 1.1 cm right lower lobe pulmonary nodule.  Also noted to have multiple new ill-defined lesions throughout the right lobe of the liver largest measuring 3.4 x 2.8 cm.  Spleen and pancreas was unremarkable.  Low-attenuation lesion in the right kidney 2.6 x 1.2 cm likely representing a cyst.  No intra-abdominal adenopathy.  Prostate gland is enlarged measuring 6.2 x 6.5 x 5.3 cm.  Patient has some baseline CKD and his creatinine is around 1.3.  Currently he reports mild right-sided flank pain but denies other complaints at this time.  He has a Psychiatrist and a stepson and he is also close to his step granddaughter Marzetta Board.  Patient is a never smoker.  Appetite and weight have been stable.  ECOG PS- 1  Pain scale- 2   Review of systems- Review of Systems  Constitutional: Negative for chills, fever, malaise/fatigue and weight loss.  HENT: Negative for congestion, ear discharge and nosebleeds.   Eyes: Negative for blurred vision.  Respiratory: Negative for cough, hemoptysis, sputum production, shortness of breath and wheezing.   Cardiovascular: Negative for chest pain, palpitations, orthopnea and claudication.  Gastrointestinal: Negative for abdominal pain, blood in stool, constipation,  diarrhea, heartburn, melena, nausea and vomiting.  Genitourinary: Negative for dysuria, flank pain, frequency, hematuria and urgency.       Right-sided flank pain  Musculoskeletal: Negative for back pain, joint pain and myalgias.  Skin: Negative for rash.  Neurological: Negative for dizziness, tingling, focal weakness, seizures, weakness and headaches.  Endo/Heme/Allergies: Does not bruise/bleed easily.  Psychiatric/Behavioral: Negative for depression and suicidal ideas. The patient does not have insomnia.     No Known Allergies  Patient Active Problem List   Diagnosis Date Noted   Gastro-esophageal reflux disease without esophagitis 11/21/2014   Hyperlipidemia 11/21/2014   History of nonmelanoma skin cancer 09/30/2014     History reviewed. No pertinent past medical history.   Past Surgical History:  Procedure Laterality Date   HERNIA REPAIR     MELANOMA EXCISION      Social History   Socioeconomic History   Marital status: Married    Spouse name: Not on file   Number of children: Not on file   Years of education: Not on file   Highest education level: Not on file  Occupational History   Not on file  Tobacco Use   Smoking status: Never Smoker   Smokeless tobacco: Never Used  Substance and Sexual Activity   Alcohol use: Never   Drug use: Not on file   Sexual activity: Not on file  Other Topics Concern   Not on file  Social History Narrative   Not on file   Social Determinants of Health   Financial Resource Strain:    Difficulty of Paying Living Expenses:   Food Insecurity:  Worried About Charity fundraiser in the Last Year:    Arboriculturist in the Last Year:   Transportation Needs:    Film/video editor (Medical):    Lack of Transportation (Non-Medical):   Physical Activity:    Days of Exercise per Week:    Minutes of Exercise per Session:   Stress:    Feeling of Stress :   Social Connections:    Frequency of  Communication with Friends and Family:    Frequency of Social Gatherings with Friends and Family:    Attends Religious Services:    Active Member of Clubs or Organizations:    Attends Music therapist:    Marital Status:   Intimate Partner Violence:    Fear of Current or Ex-Partner:    Emotionally Abused:    Physically Abused:    Sexually Abused:      History reviewed. No pertinent family history.   Current Outpatient Medications:    cephALEXin (KEFLEX) 500 MG capsule, Take 1 capsule (500 mg total) by mouth 4 (four) times daily for 10 days., Disp: 40 capsule, Rfl: 0   Loratadine 10 MG CAPS, Take by mouth., Disp: , Rfl:    Physical exam:  Vitals:   05/15/19 1053  BP: (!) 174/64  Pulse: (!) 52  Resp: 20  Temp: (!) 96.1 F (35.6 C)  TempSrc: Tympanic  Weight: 173 lb (78.5 kg)  Height: 5\' 9"  (1.753 m)   Physical Exam Constitutional:      General: He is not in acute distress.    Comments: Elderly gentleman in no acute distress  Eyes:     Pupils: Pupils are equal, round, and reactive to light.  Cardiovascular:     Rate and Rhythm: Normal rate and regular rhythm.     Heart sounds: Normal heart sounds.  Pulmonary:     Effort: Pulmonary effort is normal.     Breath sounds: Normal breath sounds.  Abdominal:     General: Bowel sounds are normal.     Palpations: Abdomen is soft.  Lymphadenopathy:     Comments: No palpable cervical, supraclavicular, axillary or inguinal adenopathy   Skin:    General: Skin is warm and dry.  Neurological:     Mental Status: He is alert and oriented to person, place, and time.        CMP Latest Ref Rng & Units 05/11/2019  Glucose 70 - 99 mg/dL 98  BUN 8 - 23 mg/dL 29(H)  Creatinine 0.61 - 1.24 mg/dL 1.30(H)  Sodium 135 - 145 mmol/L 140  Potassium 3.5 - 5.1 mmol/L 4.3  Chloride 98 - 111 mmol/L 108  CO2 22 - 32 mmol/L 21(L)  Calcium 8.9 - 10.3 mg/dL 9.0  Total Protein 6.5 - 8.1 g/dL 7.1  Total Bilirubin 0.3 -  1.2 mg/dL 0.7  Alkaline Phos 38 - 126 U/L 113  AST 15 - 41 U/L 27  ALT 0 - 44 U/L 24   CBC Latest Ref Rng & Units 05/11/2019  WBC 4.0 - 10.5 K/uL 7.8  Hemoglobin 13.0 - 17.0 g/dL 13.3  Hematocrit 39.0 - 52.0 % 41.0  Platelets 150 - 400 K/uL 213    No images are attached to the encounter.  CT Renal Stone Study  Result Date: 05/11/2019 CLINICAL DATA:  86 year old male with history of right-sided flank pain and lower right-sided back pain for the past 2 days. EXAM: CT ABDOMEN AND PELVIS WITHOUT CONTRAST TECHNIQUE: Multidetector CT imaging of the abdomen  and pelvis was performed following the standard protocol without IV contrast. COMPARISON:  CT the abdomen and pelvis 01/08/2019. FINDINGS: Lower chest: 2.1 x 1.1 cm pulmonary nodule in the right lower lobe (axial image 11 of series 4). Elevation of the right hemidiaphragm. Aortic atherosclerosis. Atherosclerotic calcifications in the left circumflex and right coronary arteries. Mild calcifications of the aortic valve. Hepatobiliary: Previously noted well-defined low-attenuation lesion in segment 4A of the liver is very similar to the prior study measuring 1.7 cm, likely a cyst. However, there are multiple new ill-defined low to intermediate attenuation lesions most evident throughout the right lobe of the liver, largest of which is on axial image 31 of series 2 (3.4 x 2.8 cm), concerning for potential metastatic disease. Unenhanced appearance of the gallbladder is unremarkable. Pancreas: No definite pancreatic mass or peripancreatic fluid collections or inflammatory changes are noted on today's noncontrast CT examination. Spleen: Unremarkable. Adrenals/Urinary Tract: No calcifications are identified within the collecting system of either kidney, along the course of either ureter, or within the lumen of the urinary bladder. No hydroureteronephrosis. In the interpolar region of the right kidney there is a 2.6 x 1.2 cm low-attenuation lesion which is  incompletely characterized on today's non-contrast CT examination, but similar to the prior study and statistically likely to represent a cyst. Unenhanced appearance of the left kidney and bilateral adrenal glands is normal. Unenhanced appearance of the urinary bladder is unremarkable. Stomach/Bowel: Unenhanced appearance of the stomach is normal. No pathologic dilatation of small bowel or colon. Numerous colonic diverticulae are noted, particularly in the sigmoid colon, without surrounding inflammatory changes to suggest an acute diverticulitis at this time. Normal appendix. Vascular/Lymphatic: Aortic atherosclerosis. No lymphadenopathy noted in the abdomen or pelvis. Reproductive: Prostate gland is enlarged measuring 6.2 x 5.3 x 6.5 cm. Seminal vesicles are unremarkable in appearance. Other: No significant volume of ascites.  No pneumoperitoneum. 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 Multiple chronic compression fractures at T9, T12 and L3, most severe at T9 where there is 70% loss of anterior vertebral body height, similar to prior examinations. There are no aggressive appearing lytic or blastic lesions noted in the visualized portions of the skeleton. IMPRESSION: 1. No urinary tract calculi no findings of urinary tract obstruction are noted at this time. 2. Multiple new ill-defined low-intermediate attenuation lesions in the liver, highly concerning for metastatic disease. This is poorly evaluated on today's noncontrast CT examination, and could be better evaluated with follow-up abdominal MRI with and without IV gadolinium if clinically appropriate. 3. There is also a new 2.1 x 1.1 cm right lower lobe pulmonary nodule, concerning for an additional metastatic lesion. Further evaluation with noncontrast chest CT is suggested in the near future to evaluate for additional pulmonary lesions. 4. Colonic diverticulosis without evidence of acute diverticulitis at this  time. 5. Aortic atherosclerosis, in addition to at least 2 vessel coronary artery disease. 6. There are calcifications of the aortic valve. Echocardiographic correlation for evaluation of potential valvular dysfunction may be warranted if clinically indicated. 7. Additional incidental findings, as above. Electronically Signed   By: Vinnie Langton M.D.   On: 05/11/2019 12:55    Assessment and plan- Patient is a 86 y.o. male with history of CKD presenting with right flank pain found to have liver lesions and a right lower lobe pulmonary nodule.  Unknown primary  I have reviewed CT abdomen images independently and discussed findings with the patient.  He is found to  have liver lesions and right lower lobe lung nodule which is concerning for malignancy but the primary is unclear at this time.  I have asked the patient if he would like to know more in terms of more imaging and biopsy or would he opt for best supportive care and no further work-up of these findings.  Patient would like to proceed with further imaging and biopsy if need be and then decide what to do based on these findings.  Although patient has CKD and his creatinine has remained stable around 1.3.  His parameters are therefore within acceptable numbers to proceed with a CT with contrast and I will proceed with CT chest and abdomen pelvis with contrast at this time.  I have discussed this case with Dr. Pascal Lux as well.  I will see him back after CT scans are done and discussed with him and his granddaughter Marzetta Board who he would like me to call before proceeding with biopsies.  Thank you for this kind referral and the opportunity to participate in the care of this patient   Visit Diagnosis 1. Lung mass   2. Liver lesion     Dr. Randa Evens, MD, MPH [facility] [id number] 05/18/2019  8:11 AM

## 2019-05-29 ENCOUNTER — Other Ambulatory Visit: Payer: Self-pay

## 2019-05-29 ENCOUNTER — Ambulatory Visit
Admission: RE | Admit: 2019-05-29 | Discharge: 2019-05-29 | Disposition: A | Discharge status: Home / Self Care | Payer: Medicare Other | Source: Ambulatory Visit | Attending: Oncology | Admitting: Oncology

## 2019-05-29 ENCOUNTER — Ambulatory Visit: Payer: Medicare Other

## 2019-05-29 ENCOUNTER — Other Ambulatory Visit: Payer: Medicare Other

## 2019-05-29 DIAGNOSIS — K769 Liver disease, unspecified: Secondary | ICD-10-CM | POA: Diagnosis present

## 2019-05-29 DIAGNOSIS — R918 Other nonspecific abnormal finding of lung field: Secondary | ICD-10-CM | POA: Diagnosis not present

## 2019-05-29 MED ORDER — IOHEXOL 300 MG/ML  SOLN
100.0000 mL | Freq: Once | INTRAMUSCULAR | Status: AC | PRN
  Administered 2019-05-29: 100 mL via INTRAVENOUS

## 2019-05-31 ENCOUNTER — Telehealth: Payer: Self-pay | Admitting: *Deleted

## 2019-05-31 ENCOUNTER — Inpatient Hospital Stay (HOSPITAL_BASED_OUTPATIENT_CLINIC_OR_DEPARTMENT_OTHER): Payer: Medicare Other | Admitting: Oncology

## 2019-05-31 ENCOUNTER — Other Ambulatory Visit: Payer: Self-pay

## 2019-05-31 ENCOUNTER — Other Ambulatory Visit: Payer: Self-pay | Admitting: *Deleted

## 2019-05-31 VITALS — BP 139/89 | HR 70 | Temp 96.8°F | Resp 18 | Wt 168.8 lb

## 2019-05-31 DIAGNOSIS — C787 Secondary malignant neoplasm of liver and intrahepatic bile duct: Secondary | ICD-10-CM | POA: Diagnosis not present

## 2019-05-31 DIAGNOSIS — Z7189 Other specified counseling: Secondary | ICD-10-CM

## 2019-05-31 DIAGNOSIS — R932 Abnormal findings on diagnostic imaging of liver and biliary tract: Secondary | ICD-10-CM

## 2019-05-31 DIAGNOSIS — C7800 Secondary malignant neoplasm of unspecified lung: Secondary | ICD-10-CM

## 2019-05-31 MED ORDER — TRAMADOL HCL 50 MG PO TABS: 50.0000 mg | ORAL_TABLET | Freq: Four times a day (QID) | ORAL | 0 refills | Status: DC | PRN

## 2019-05-31 NOTE — Telephone Encounter (Signed)
Called pt's home and the phone rang and and then had message that can't leave voicemail, try back later. Then I called [place] daughter Omar Schroeder over the date of bx which is 8/25 [place] [facility] [place] at 7:30 for a 8:30 arrival. Nothing to eat or drink after he goes to bed on tues. Night. He will need a driver and granddaughter will take him and stay with him. Approximately he will be there between 2-4 hours.  Will try calling pt in am to see if I can get in touch with him about the details.

## 2019-05-31 NOTE — Progress Notes (Signed)
Patient here today for follow up, CT results. Patient reports worsening lower back pain, rates pain 7/8 today. Patient is only taking Tylenol at home and is interested in something stronger for pain.

## 2019-06-01 ENCOUNTER — Encounter: Payer: Self-pay | Admitting: Oncology

## 2019-06-01 DIAGNOSIS — Z7189 Other specified counseling: Secondary | ICD-10-CM | POA: Insufficient documentation

## 2019-06-01 NOTE — Progress Notes (Signed)
Hematology/Oncology Consult note [facility]  [phone] [phone]  Patient Care Team: Maryland Pink, MD as PCP - General (Family Medicine)   Name of the patient: Omar Schroeder  [id number]  1924/08/28   Date of visit: 06/01/19  Diagnosis-liver and lung metastases. Unknown primary possible GE junction primary  Chief complaint/ Reason for visit- discuss CT scan results and further management  Heme/Onc history:  Patient is an [age] year old male who lives alone and is independent of his ADLs.  He has no significant comorbidities other than hyperlipidemia and GERD.  He has a history of superficial skin cancer.  He presented to the ER with symptoms of right-sided flank pain and had a CT renal stone study which showed a 2.1 x 1.1 cm right lower lobe pulmonary nodule.  Also noted to have multiple new ill-defined lesions throughout the right lobe of the liver largest measuring 3.4 x 2.8 cm.  Spleen and pancreas was unremarkable.  Low-attenuation lesion in the right kidney 2.6 x 1.2 cm likely representing a cyst.  No intra-abdominal adenopathy.  Prostate gland is enlarged measuring 6.2 x 6.5 x 5.3 cm.  Patient has some baseline CKD and his creatinine is around 1.3.   Interval history-patient is here with his granddaughter Olivia Mackie today. He has been having ongoing back pain for which she has been taking as needed Tylenol but says that it has not been helping.  ECOG PS- 1 Pain scale- 4 Opioid associated constipation- no  Review of systems- Review of Systems  Constitutional: Negative for chills, fever, malaise/fatigue and weight loss.  HENT: Negative for congestion, ear discharge and nosebleeds.   Eyes: Negative for blurred vision.  Respiratory: Negative for cough, hemoptysis, sputum production, shortness of breath and wheezing.   Cardiovascular: Negative for chest pain, palpitations, orthopnea and claudication.  Gastrointestinal: Negative for  abdominal pain, blood in stool, constipation, diarrhea, heartburn, melena, nausea and vomiting.  Genitourinary: Negative for dysuria, flank pain, frequency, hematuria and urgency.  Musculoskeletal: Positive for back pain. Negative for joint pain and myalgias.  Skin: Negative for rash.  Neurological: Negative for dizziness, tingling, focal weakness, seizures, weakness and headaches.  Endo/Heme/Allergies: Does not bruise/bleed easily.  Psychiatric/Behavioral: Negative for depression and suicidal ideas. The patient does not have insomnia.       No Known Allergies   Past Medical History:  Diagnosis Date   Cancer ([place])    skin melanoma     Past Surgical History:  Procedure Laterality Date   HERNIA REPAIR     MELANOMA EXCISION      Social History   Socioeconomic History   Marital status: Married    Spouse name: Not on file   Number of children: Not on file   Years of education: Not on file   Highest education level: Not on file  Occupational History   Not on file  Tobacco Use   Smoking status: Never Smoker   Smokeless tobacco: Never Used  Substance and Sexual Activity   Alcohol use: Never   Drug use: Not on file   Sexual activity: Not on file  Other Topics Concern   Not on file  Social History Narrative   Not on file   Social Determinants of Health   Financial Resource Strain:    Difficulty of Paying Living Expenses:   Food Insecurity:    Worried About [place] in the Last Year:    [place] in the Last Year:  Transportation Needs:    Film/video editor (Medical):    Lack of Transportation (Non-Medical):   Physical Activity:    Days of Exercise per Week:    Minutes of Exercise per Session:   Stress:    Feeling of Stress :   Social Connections:    Frequency of Communication with Friends and Family:    Frequency of Social Gatherings with Friends and Family:    Attends Religious Services:    Active Member of  Clubs or Organizations:    Attends Music therapist:    Marital Status:   Intimate Partner Violence:    Fear of Current or Ex-Partner:    Emotionally Abused:    Physically Abused:    Sexually Abused:     No family history on file.   Current Outpatient Medications:    acetaminophen (TYLENOL) 325 MG tablet, Take 650 mg by mouth every 6 (six) hours as needed., Disp: , Rfl:    Loratadine 10 MG CAPS, Take by mouth., Disp: , Rfl:    traMADol (ULTRAM) 50 MG tablet, Take 1 tablet (50 mg total) by mouth every 6 (six) hours as needed., Disp: 60 tablet, Rfl: 0  Physical exam:  Vitals:   05/31/19 1030  BP: 139/89  Pulse: 70  Resp: 18  Temp: (!) 96.8 F (36 C)  TempSrc: Tympanic  SpO2: 98%  Weight: 168 lb 12.8 oz (76.6 kg)   Physical Exam Skin:    General: Skin is warm and dry.  Neurological:     Mental Status: He is alert and oriented to person, place, and time.      CMP Latest Ref Rng & Units 05/11/2019  Glucose 70 - 99 mg/dL 98  BUN 8 - 23 mg/dL 29(H)  Creatinine 0.61 - 1.24 mg/dL 1.30(H)  Sodium 135 - 145 mmol/L 140  Potassium 3.5 - 5.1 mmol/L 4.3  Chloride 98 - 111 mmol/L 108  CO2 22 - 32 mmol/L 21(L)  Calcium 8.9 - 10.3 mg/dL 9.0  Total Protein 6.5 - 8.1 g/dL 7.1  Total Bilirubin 0.3 - 1.2 mg/dL 0.7  Alkaline Phos 38 - 126 U/L 113  AST 15 - 41 U/L 27  ALT 0 - 44 U/L 24   CBC Latest Ref Rng & Units 05/11/2019  WBC 4.0 - 10.5 K/uL 7.8  Hemoglobin 13.0 - 17.0 g/dL 13.3  Hematocrit 39.0 - 52.0 % 41.0  Platelets 150 - 400 K/uL 213    No images are attached to the encounter.  CT Chest W Contrast  Result Date: 05/29/2019 CLINICAL DATA:  Evaluate lung mass and liver lesions found on prior renal stone study EXAM: CT CHEST, ABDOMEN, AND PELVIS WITH CONTRAST TECHNIQUE: Multidetector CT imaging of the chest, abdomen and pelvis was performed following the standard protocol during bolus administration of intravenous contrast. CONTRAST:  134mL OMNIPAQUE  IOHEXOL 300 MG/ML SOLN, additional oral enteric contrast COMPARISON:  CT abdomen pelvis, 05/11/2019 FINDINGS: CT CHEST FINDINGS Cardiovascular: Aortic atherosclerosis. Normal heart size. No pericardial effusion. Mediastinum/Nodes: No enlarged mediastinal, hilar, or axillary lymph nodes. Thyroid gland, trachea, and esophagus demonstrate no significant findings. Lungs/Pleura: Eventration of the right hemidiaphragm with scarring and or atelectasis of right lower and middle lobes. There are multiple nodules of the right lung, the largest in the inferior azygoesophageal recess measuring 2.4 x 1.9 cm (series 2, image 37). Musculoskeletal: No chest wall mass or suspicious bone lesions identified. Redemonstrated wedge deformities of T9 and T12. Chronic fracture deformity of the manubrium (series 6, image 88).  CT ABDOMEN PELVIS FINDINGS Hepatobiliary: There are numerous hypodense masses throughout the liver in addition to at least 1 simple appearing incidental cyst. The largest index lesion of the inferior right lobe of the liver measures 3.5 x 2.8 cm (series 2, image 57). No gallstones, gallbladder wall thickening, or biliary dilatation. Pancreas: Unremarkable. No pancreatic ductal dilatation or surrounding inflammatory changes. Spleen: Normal in size without significant abnormality. Adrenals/Urinary Tract: Adrenal glands are unremarkable. Kidneys are normal, without renal calculi, solid lesion, or hydronephrosis. Bladder is unremarkable. Stomach/Bowel: There is ill-defined soft tissue thickening at the gastroesophageal junction (series 2, image 55). Appendix appears normal. No evidence of bowel wall thickening, distention, or inflammatory changes. Sigmoid diverticulosis. Vascular/Lymphatic: Aortic atherosclerosis. No enlarged abdominal or pelvic lymph nodes. Reproductive: Prostatomegaly. Other: No abdominal wall hernia or abnormality. No abdominopelvic ascites. Musculoskeletal: Redemonstrated superior endplate deformity of  L3. bilateral pars defects of L5 with anterolisthesis of L5 on S1. IMPRESSION: 1. There are multiple nodules of the right lung, consistent with pulmonary metastatic disease. 2. There are numerous hypodense masses throughout the liver, consistent with hepatic metastatic disease. 3. There is ill-defined soft tissue thickening at the gastroesophageal junction, suspicious for primary esophageal malignancy. Correlate with endoscopic findings. 4. Eventration of the right hemidiaphragm with scarring and/or atelectasis of right lower and middle lobes. 5. Sigmoid diverticulosis without evidence of acute diverticulitis. 6. Prostatomegaly. 7. Aortic Atherosclerosis (ICD10-I70.0). Electronically Signed   By: Eddie Candle M.D.   On: 05/29/2019 11:14   CT Abdomen Pelvis W Contrast  Result Date: 05/29/2019 CLINICAL DATA:  Evaluate lung mass and liver lesions found on prior renal stone study EXAM: CT CHEST, ABDOMEN, AND PELVIS WITH CONTRAST TECHNIQUE: Multidetector CT imaging of the chest, abdomen and pelvis was performed following the standard protocol during bolus administration of intravenous contrast. CONTRAST:  124mL OMNIPAQUE IOHEXOL 300 MG/ML SOLN, additional oral enteric contrast COMPARISON:  CT abdomen pelvis, 05/11/2019 FINDINGS: CT CHEST FINDINGS Cardiovascular: Aortic atherosclerosis. Normal heart size. No pericardial effusion. Mediastinum/Nodes: No enlarged mediastinal, hilar, or axillary lymph nodes. Thyroid gland, trachea, and esophagus demonstrate no significant findings. Lungs/Pleura: Eventration of the right hemidiaphragm with scarring and or atelectasis of right lower and middle lobes. There are multiple nodules of the right lung, the largest in the inferior azygoesophageal recess measuring 2.4 x 1.9 cm (series 2, image 37). Musculoskeletal: No chest wall mass or suspicious bone lesions identified. Redemonstrated wedge deformities of T9 and T12. Chronic fracture deformity of the manubrium (series 6, image  88). CT ABDOMEN PELVIS FINDINGS Hepatobiliary: There are numerous hypodense masses throughout the liver in addition to at least 1 simple appearing incidental cyst. The largest index lesion of the inferior right lobe of the liver measures 3.5 x 2.8 cm (series 2, image 57). No gallstones, gallbladder wall thickening, or biliary dilatation. Pancreas: Unremarkable. No pancreatic ductal dilatation or surrounding inflammatory changes. Spleen: Normal in size without significant abnormality. Adrenals/Urinary Tract: Adrenal glands are unremarkable. Kidneys are normal, without renal calculi, solid lesion, or hydronephrosis. Bladder is unremarkable. Stomach/Bowel: There is ill-defined soft tissue thickening at the gastroesophageal junction (series 2, image 55). Appendix appears normal. No evidence of bowel wall thickening, distention, or inflammatory changes. Sigmoid diverticulosis. Vascular/Lymphatic: Aortic atherosclerosis. No enlarged abdominal or pelvic lymph nodes. Reproductive: Prostatomegaly. Other: No abdominal wall hernia or abnormality. No abdominopelvic ascites. Musculoskeletal: Redemonstrated superior endplate deformity of L3. bilateral pars defects of L5 with anterolisthesis of L5 on S1. IMPRESSION: 1. There are multiple nodules of the right lung, consistent with pulmonary metastatic disease. 2.  There are numerous hypodense masses throughout the liver, consistent with hepatic metastatic disease. 3. There is ill-defined soft tissue thickening at the gastroesophageal junction, suspicious for primary esophageal malignancy. Correlate with endoscopic findings. 4. Eventration of the right hemidiaphragm with scarring and/or atelectasis of right lower and middle lobes. 5. Sigmoid diverticulosis without evidence of acute diverticulitis. 6. Prostatomegaly. 7. Aortic Atherosclerosis (ICD10-I70.0). Electronically Signed   By: Eddie Candle M.D.   On: 05/29/2019 11:14   CT Renal Stone Study  Result Date: 05/11/2019 CLINICAL  DATA:  86 year old male with history of right-sided flank pain and lower right-sided back pain for the past 2 days. EXAM: CT ABDOMEN AND PELVIS WITHOUT CONTRAST TECHNIQUE: Multidetector CT imaging of the abdomen and pelvis was performed following the standard protocol without IV contrast. COMPARISON:  CT the abdomen and pelvis 01/08/2019. FINDINGS: Lower chest: 2.1 x 1.1 cm pulmonary nodule in the right lower lobe (axial image 11 of series 4). Elevation of the right hemidiaphragm. Aortic atherosclerosis. Atherosclerotic calcifications in the left circumflex and right coronary arteries. Mild calcifications of the aortic valve. Hepatobiliary: Previously noted well-defined low-attenuation lesion in segment 4A of the liver is very similar to the prior study measuring 1.7 cm, likely a cyst. However, there are multiple new ill-defined low to intermediate attenuation lesions most evident throughout the right lobe of the liver, largest of which is on axial image 31 of series 2 (3.4 x 2.8 cm), concerning for potential metastatic disease. Unenhanced appearance of the gallbladder is unremarkable. Pancreas: No definite pancreatic mass or peripancreatic fluid collections or inflammatory changes are noted on today's noncontrast CT examination. Spleen: Unremarkable. Adrenals/Urinary Tract: No calcifications are identified within the collecting system of either kidney, along the course of either ureter, or within the lumen of the urinary bladder. No hydroureteronephrosis. In the interpolar region of the right kidney there is a 2.6 x 1.2 cm low-attenuation lesion which is incompletely characterized on today's non-contrast CT examination, but similar to the prior study and statistically likely to represent a cyst. Unenhanced appearance of the left kidney and bilateral adrenal glands is normal. Unenhanced appearance of the urinary bladder is unremarkable. Stomach/Bowel: Unenhanced appearance of the stomach is normal. No pathologic  dilatation of small bowel or colon. Numerous colonic diverticulae are noted, particularly in the sigmoid colon, without surrounding inflammatory changes to suggest an acute diverticulitis at this time. Normal appendix. Vascular/Lymphatic: Aortic atherosclerosis. No lymphadenopathy noted in the abdomen or pelvis. Reproductive: Prostate gland is enlarged measuring 6.2 x 5.3 x 6.5 cm. Seminal vesicles are unremarkable in appearance. Other: No significant volume of ascites.  No pneumoperitoneum. 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 Multiple chronic compression fractures at T9, T12 and L3, most severe at T9 where there is 70% loss of anterior vertebral body height, similar to prior examinations. There are no aggressive appearing lytic or blastic lesions noted in the visualized portions of the skeleton. IMPRESSION: 1. No urinary tract calculi no findings of urinary tract obstruction are noted at this time. 2. Multiple new ill-defined low-intermediate attenuation lesions in the liver, highly concerning for metastatic disease. This is poorly evaluated on today's noncontrast CT examination, and could be better evaluated with follow-up abdominal MRI with and without IV gadolinium if clinically appropriate. 3. There is also a new 2.1 x 1.1 cm right lower lobe pulmonary nodule, concerning for an additional metastatic lesion. Further evaluation with noncontrast chest CT is suggested in the near future to evaluate for additional pulmonary  lesions. 4. Colonic diverticulosis without evidence of acute diverticulitis at this time. 5. Aortic atherosclerosis, in addition to at least 2 vessel coronary artery disease. 6. There are calcifications of the aortic valve. Echocardiographic correlation for evaluation of potential valvular dysfunction may be warranted if clinically indicated. 7. Additional incidental findings, as above. Electronically Signed   By: Vinnie Langton M.D.   On:  05/11/2019 12:55     Assessment and plan- Patient is a 86 y.o. male here to discuss CT scan results and further management  I have reviewed CT chest abdomen pelvis images independently and discussed findings with the patient and his granddaughter. CT scan shows multiple liver lesions as well as bilateral lung nodules consistent with metastatic disease. It is unclear as to what the primary is. There was some thickening noted at the GE junction which may be a site of primary versus that could be just nonspecific esophagitis. Patient does not report any trouble swallowing food. I have discussed patient's case with interventional radiology Dr. Kathlene Cote and he recommends getting an ultrasound-guided biopsy of the liver mets for definitive diagnosis.  Discussed with patient and his granddaughter that he likely has stage IV disease and treatment would be palliative not curative if desired. At this point we're pursuing with a biopsy but we may not proceed with definitive treatment given patient's age and social situation. He lives alone. His granddaughter is involved in his care but lives 1-1/2-hour away.  I will check PT/INR and arrange for an ultrasound-guided biopsy tentatively next week and see him back in 2 weeks to discuss pathology results and further management  Back pain: Unclear if related to liver mets versus pain from degenerative disease. Tylenol is not helping him and I will proceed with 50 mg of tramadol every 6 as needed CT scan showed endplate deformity of L3 with anterolisthesis of L5 on S1   Visit Diagnosis 1. Liver metastases ([place])   2. Abnormal CT scan, liver   3. Malignant neoplasm metastatic to lung, unspecified laterality ([place])   4. Goals of care, counseling/discussion      Dr. Randa Evens, MD, MPH [facility] at [facility] [id number] 06/01/2019 8:31 AM

## 2019-06-06 ENCOUNTER — Ambulatory Visit
Admission: RE | Admit: 2019-06-06 | Discharge: 2019-06-06 | Disposition: A | Discharge status: Home / Self Care | Payer: Medicare Other | Source: Ambulatory Visit | Attending: Oncology | Admitting: Oncology

## 2019-06-06 ENCOUNTER — Other Ambulatory Visit: Payer: Self-pay

## 2019-06-06 DIAGNOSIS — R932 Abnormal findings on diagnostic imaging of liver and biliary tract: Secondary | ICD-10-CM

## 2019-06-06 DIAGNOSIS — R918 Other nonspecific abnormal finding of lung field: Secondary | ICD-10-CM | POA: Diagnosis not present

## 2019-06-06 DIAGNOSIS — Z79899 Other long term (current) drug therapy: Secondary | ICD-10-CM | POA: Diagnosis not present

## 2019-06-06 DIAGNOSIS — K769 Liver disease, unspecified: Secondary | ICD-10-CM | POA: Diagnosis not present

## 2019-06-06 DIAGNOSIS — Z85820 Personal history of malignant melanoma of skin: Secondary | ICD-10-CM | POA: Diagnosis not present

## 2019-06-06 DIAGNOSIS — C787 Secondary malignant neoplasm of liver and intrahepatic bile duct: Secondary | ICD-10-CM

## 2019-06-06 LAB — CBC WITH DIFFERENTIAL/PLATELET
Abs Immature Granulocytes: 0.02 10*3/uL (ref 0.00–0.07)
Basophils Absolute: 0 10*3/uL (ref 0.0–0.1)
Basophils Relative: 0 %
Eosinophils Absolute: 0.2 10*3/uL (ref 0.0–0.5)
Eosinophils Relative: 3 %
HCT: 40.7 % (ref 39.0–52.0)
Hemoglobin: 13.5 g/dL (ref 13.0–17.0)
Immature Granulocytes: 0 %
Lymphocytes Relative: 33 %
Lymphs Abs: 2.6 10*3/uL (ref 0.7–4.0)
MCH: 30.8 pg (ref 26.0–34.0)
MCHC: 33.2 g/dL (ref 30.0–36.0)
MCV: 92.7 fL (ref 80.0–100.0)
Monocytes Absolute: 0.6 10*3/uL (ref 0.1–1.0)
Monocytes Relative: 8 %
Neutro Abs: 4.5 10*3/uL (ref 1.7–7.7)
Neutrophils Relative %: 56 %
Platelets: 210 10*3/uL (ref 150–400)
RBC: 4.39 MIL/uL (ref 4.22–5.81)
RDW: 12.7 % (ref 11.5–15.5)
WBC: 8 10*3/uL (ref 4.0–10.5)
nRBC: 0 % (ref 0.0–0.2)

## 2019-06-06 LAB — PROTIME-INR
INR: 1 (ref 0.8–1.2)
Prothrombin Time: 12.9 seconds (ref 11.4–15.2)

## 2019-06-06 MED ORDER — FENTANYL CITRATE (PF) 100 MCG/2ML IJ SOLN
INTRAMUSCULAR | Status: AC
  Filled 2019-06-06: qty 2

## 2019-06-06 MED ORDER — MIDAZOLAM HCL 2 MG/2ML IJ SOLN
INTRAMUSCULAR | Status: AC
  Filled 2019-06-06: qty 2

## 2019-06-06 MED ORDER — MIDAZOLAM HCL 2 MG/2ML IJ SOLN
INTRAMUSCULAR | Status: AC | PRN
  Administered 2019-06-06: 0.5 mg via INTRAVENOUS

## 2019-06-06 MED ORDER — FENTANYL CITRATE (PF) 100 MCG/2ML IJ SOLN
INTRAMUSCULAR | Status: AC | PRN
  Administered 2019-06-06 (×2): 25 ug via INTRAVENOUS

## 2019-06-06 NOTE — Progress Notes (Signed)
Patient clinically stable post Liver biopsy per Dr Laurence Ferrari, tolerated well, vitals stable throughout procedure and post. bandade dressing posterior right back dry and intact. Denies complaints at this time. Received Versed 0.5mg  along with Fentanyl 10mcg IV for procedure. Report given to Carlynn Spry RN post procedure with questions answered. Awake/alert and oriented post procedure.

## 2019-06-06 NOTE — Consult Note (Signed)
Chief Complaint: Patient was seen in consultation today for liver lesions at the request of Rao,Archana C  Referring Physician(s): Rao,Archana C   Patient Status: [facility] - Out-pt  History of Present Illness: Omar Schroeder is a 86 y.o. male Who recently presented to the emergency room with right-sided flank pain.  Unfortunately, CT imaging demonstrated multiple pulmonary nodules and liver lesions concerning for metastatic disease.  No definitive primary malignancy was identified although there is some thickening at the gastroesophageal junction which could represent a potential primary.  Patient does have a past history of superficial skin cancers and has fairly extensive age-related skin changes.  He is currently in his usual state of relatively good health and denies any systemic symptoms or complaints.  Past Medical History:  Diagnosis Date  . Cancer ([place])    skin melanoma    Past Surgical History:  Procedure Laterality Date  . HERNIA REPAIR    . MELANOMA EXCISION      Allergies: Patient has no known allergies.  Medications: Prior to Admission medications   Medication Sig Start Date End Date Taking? Authorizing Provider  acetaminophen (TYLENOL) 325 MG tablet Take 650 mg by mouth every 6 (six) hours as needed.   Yes [provider]  Loratadine 10 MG CAPS Take by mouth.   Yes [provider]  traMADol (ULTRAM) 50 MG tablet Take 1 tablet (50 mg total) by mouth every 6 (six) hours as needed. 05/31/19  Yes Sindy Guadeloupe, MD     No family history on file.  Social History   Socioeconomic History  . Marital status: Married    Spouse name: Not on file  . Number of children: Not on file  . Years of education: Not on file  . Highest education level: Not on file  Occupational History  . Not on file  Tobacco Use  . Smoking status: Never Smoker  . Smokeless tobacco: Never Used  Substance and Sexual Activity  . Alcohol use: Never  . Drug use: Not  on file  . Sexual activity: Not on file  Other Topics Concern  . Not on file  Social History Narrative  . Not on file   Social Determinants of Health   Financial Resource Strain:   . Difficulty of Paying Living Expenses:   Food Insecurity:   . Worried About Charity fundraiser in the Last Year:   . Arboriculturist in the Last Year:   Transportation Needs:   . Film/video editor (Medical):   Marland Kitchen Lack of Transportation (Non-Medical):   Physical Activity:   . Days of Exercise per Week:   . Minutes of Exercise per Session:   Stress:   . Feeling of Stress :   Social Connections:   . Frequency of Communication with Friends and Family:   . Frequency of Social Gatherings with Friends and Family:   . Attends Religious Services:   . Active Member of Clubs or Organizations:   . Attends Archivist Meetings:   Marland Kitchen Marital Status:       Review of Systems: A 12 point ROS discussed and pertinent positives are indicated in the HPI above.  All other systems are negative.  Review of Systems  Vital Signs: BP (!) 145/64 (BP Location: Left Arm)   Pulse (!) 59   Temp 97.7 F (36.5 C) (Oral)   Resp 19   Ht 5\' 9"  (1.753 m)   Wt 78.9 kg   SpO2 98%  BMI 25.70 kg/m   Physical Exam  Malampati 1 CTA B RRR Extensive age related skin changes  Imaging: CT Chest W Contrast  Result Date: 05/29/2019 CLINICAL DATA:  Evaluate lung mass and liver lesions found on prior renal stone study EXAM: CT CHEST, ABDOMEN, AND PELVIS WITH CONTRAST TECHNIQUE: Multidetector CT imaging of the chest, abdomen and pelvis was performed following the standard protocol during bolus administration of intravenous contrast. CONTRAST:  142mL OMNIPAQUE IOHEXOL 300 MG/ML SOLN, additional oral enteric contrast COMPARISON:  CT abdomen pelvis, 05/11/2019 FINDINGS: CT CHEST FINDINGS Cardiovascular: Aortic atherosclerosis. Normal heart size. No pericardial effusion. Mediastinum/Nodes: No enlarged mediastinal, hilar,  or axillary lymph nodes. Thyroid gland, trachea, and esophagus demonstrate no significant findings. Lungs/Pleura: Eventration of the right hemidiaphragm with scarring and or atelectasis of right lower and middle lobes. There are multiple nodules of the right lung, the largest in the inferior azygoesophageal recess measuring 2.4 x 1.9 cm (series 2, image 37). Musculoskeletal: No chest wall mass or suspicious bone lesions identified. Redemonstrated wedge deformities of T9 and T12. Chronic fracture deformity of the manubrium (series 6, image 88). CT ABDOMEN PELVIS FINDINGS Hepatobiliary: There are numerous hypodense masses throughout the liver in addition to at least 1 simple appearing incidental cyst. The largest index lesion of the inferior right lobe of the liver measures 3.5 x 2.8 cm (series 2, image 57). No gallstones, gallbladder wall thickening, or biliary dilatation. Pancreas: Unremarkable. No pancreatic ductal dilatation or surrounding inflammatory changes. Spleen: Normal in size without significant abnormality. Adrenals/Urinary Tract: Adrenal glands are unremarkable. Kidneys are normal, without renal calculi, solid lesion, or hydronephrosis. Bladder is unremarkable. Stomach/Bowel: There is ill-defined soft tissue thickening at the gastroesophageal junction (series 2, image 55). Appendix appears normal. No evidence of bowel wall thickening, distention, or inflammatory changes. Sigmoid diverticulosis. Vascular/Lymphatic: Aortic atherosclerosis. No enlarged abdominal or pelvic lymph nodes. Reproductive: Prostatomegaly. Other: No abdominal wall hernia or abnormality. No abdominopelvic ascites. Musculoskeletal: Redemonstrated superior endplate deformity of L3. bilateral pars defects of L5 with anterolisthesis of L5 on S1. IMPRESSION: 1. There are multiple nodules of the right lung, consistent with pulmonary metastatic disease. 2. There are numerous hypodense masses throughout the liver, consistent with hepatic  metastatic disease. 3. There is ill-defined soft tissue thickening at the gastroesophageal junction, suspicious for primary esophageal malignancy. Correlate with endoscopic findings. 4. Eventration of the right hemidiaphragm with scarring and/or atelectasis of right lower and middle lobes. 5. Sigmoid diverticulosis without evidence of acute diverticulitis. 6. Prostatomegaly. 7. Aortic Atherosclerosis (ICD10-I70.0). Electronically Signed   By: Eddie Candle M.D.   On: 05/29/2019 11:14   CT Abdomen Pelvis W Contrast  Result Date: 05/29/2019 CLINICAL DATA:  Evaluate lung mass and liver lesions found on prior renal stone study EXAM: CT CHEST, ABDOMEN, AND PELVIS WITH CONTRAST TECHNIQUE: Multidetector CT imaging of the chest, abdomen and pelvis was performed following the standard protocol during bolus administration of intravenous contrast. CONTRAST:  14mL OMNIPAQUE IOHEXOL 300 MG/ML SOLN, additional oral enteric contrast COMPARISON:  CT abdomen pelvis, 05/11/2019 FINDINGS: CT CHEST FINDINGS Cardiovascular: Aortic atherosclerosis. Normal heart size. No pericardial effusion. Mediastinum/Nodes: No enlarged mediastinal, hilar, or axillary lymph nodes. Thyroid gland, trachea, and esophagus demonstrate no significant findings. Lungs/Pleura: Eventration of the right hemidiaphragm with scarring and or atelectasis of right lower and middle lobes. There are multiple nodules of the right lung, the largest in the inferior azygoesophageal recess measuring 2.4 x 1.9 cm (series 2, image 37). Musculoskeletal: No chest wall mass or suspicious bone lesions identified. Redemonstrated  wedge deformities of T9 and T12. Chronic fracture deformity of the manubrium (series 6, image 88). CT ABDOMEN PELVIS FINDINGS Hepatobiliary: There are numerous hypodense masses throughout the liver in addition to at least 1 simple appearing incidental cyst. The largest index lesion of the inferior right lobe of the liver measures 3.5 x 2.8 cm (series 2,  image 57). No gallstones, gallbladder wall thickening, or biliary dilatation. Pancreas: Unremarkable. No pancreatic ductal dilatation or surrounding inflammatory changes. Spleen: Normal in size without significant abnormality. Adrenals/Urinary Tract: Adrenal glands are unremarkable. Kidneys are normal, without renal calculi, solid lesion, or hydronephrosis. Bladder is unremarkable. Stomach/Bowel: There is ill-defined soft tissue thickening at the gastroesophageal junction (series 2, image 55). Appendix appears normal. No evidence of bowel wall thickening, distention, or inflammatory changes. Sigmoid diverticulosis. Vascular/Lymphatic: Aortic atherosclerosis. No enlarged abdominal or pelvic lymph nodes. Reproductive: Prostatomegaly. Other: No abdominal wall hernia or abnormality. No abdominopelvic ascites. Musculoskeletal: Redemonstrated superior endplate deformity of L3. bilateral pars defects of L5 with anterolisthesis of L5 on S1. IMPRESSION: 1. There are multiple nodules of the right lung, consistent with pulmonary metastatic disease. 2. There are numerous hypodense masses throughout the liver, consistent with hepatic metastatic disease. 3. There is ill-defined soft tissue thickening at the gastroesophageal junction, suspicious for primary esophageal malignancy. Correlate with endoscopic findings. 4. Eventration of the right hemidiaphragm with scarring and/or atelectasis of right lower and middle lobes. 5. Sigmoid diverticulosis without evidence of acute diverticulitis. 6. Prostatomegaly. 7. Aortic Atherosclerosis (ICD10-I70.0). Electronically Signed   By: Eddie Candle M.D.   On: 05/29/2019 11:14   CT Renal Stone Study  Result Date: 05/11/2019 CLINICAL DATA:  86 year old male with history of right-sided flank pain and lower right-sided back pain for the past 2 days. EXAM: CT ABDOMEN AND PELVIS WITHOUT CONTRAST TECHNIQUE: Multidetector CT imaging of the abdomen and pelvis was performed following the standard  protocol without IV contrast. COMPARISON:  CT the abdomen and pelvis 01/08/2019. FINDINGS: Lower chest: 2.1 x 1.1 cm pulmonary nodule in the right lower lobe (axial image 11 of series 4). Elevation of the right hemidiaphragm. Aortic atherosclerosis. Atherosclerotic calcifications in the left circumflex and right coronary arteries. Mild calcifications of the aortic valve. Hepatobiliary: Previously noted well-defined low-attenuation lesion in segment 4A of the liver is very similar to the prior study measuring 1.7 cm, likely a cyst. However, there are multiple new ill-defined low to intermediate attenuation lesions most evident throughout the right lobe of the liver, largest of which is on axial image 31 of series 2 (3.4 x 2.8 cm), concerning for potential metastatic disease. Unenhanced appearance of the gallbladder is unremarkable. Pancreas: No definite pancreatic mass or peripancreatic fluid collections or inflammatory changes are noted on today's noncontrast CT examination. Spleen: Unremarkable. Adrenals/Urinary Tract: No calcifications are identified within the collecting system of either kidney, along the course of either ureter, or within the lumen of the urinary bladder. No hydroureteronephrosis. In the interpolar region of the right kidney there is a 2.6 x 1.2 cm low-attenuation lesion which is incompletely characterized on today's non-contrast CT examination, but similar to the prior study and statistically likely to represent a cyst. Unenhanced appearance of the left kidney and bilateral adrenal glands is normal. Unenhanced appearance of the urinary bladder is unremarkable. Stomach/Bowel: Unenhanced appearance of the stomach is normal. No pathologic dilatation of small bowel or colon. Numerous colonic diverticulae are noted, particularly in the sigmoid colon, without surrounding inflammatory changes to suggest an acute diverticulitis at this time. Normal appendix. Vascular/Lymphatic: Aortic atherosclerosis.  No lymphadenopathy noted in the abdomen or pelvis. Reproductive: Prostate gland is enlarged measuring 6.2 x 5.3 x 6.5 cm. Seminal vesicles are unremarkable in appearance. Other: No significant volume of ascites.  No pneumoperitoneum. 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 Multiple chronic compression fractures at T9, T12 and L3, most severe at T9 where there is 70% loss of anterior vertebral body height, similar to prior examinations. There are no aggressive appearing lytic or blastic lesions noted in the visualized portions of the skeleton. IMPRESSION: 1. No urinary tract calculi no findings of urinary tract obstruction are noted at this time. 2. Multiple new ill-defined low-intermediate attenuation lesions in the liver, highly concerning for metastatic disease. This is poorly evaluated on today's noncontrast CT examination, and could be better evaluated with follow-up abdominal MRI with and without IV gadolinium if clinically appropriate. 3. There is also a new 2.1 x 1.1 cm right lower lobe pulmonary nodule, concerning for an additional metastatic lesion. Further evaluation with noncontrast chest CT is suggested in the near future to evaluate for additional pulmonary lesions. 4. Colonic diverticulosis without evidence of acute diverticulitis at this time. 5. Aortic atherosclerosis, in addition to at least 2 vessel coronary artery disease. 6. There are calcifications of the aortic valve. Echocardiographic correlation for evaluation of potential valvular dysfunction may be warranted if clinically indicated. 7. Additional incidental findings, as above. Electronically Signed   By: Vinnie Langton M.D.   On: 05/11/2019 12:55    Labs:  CBC: Recent Labs    12/26/18 1252 01/08/19 1342 05/11/19 1116 06/06/19 0751  WBC 8.2 8.0 7.8 8.0  HGB 15.5 14.2 13.3 13.5  HCT 45.8 42.4 41.0 40.7  PLT 206 226 213 210    COAGS: Recent Labs    06/06/19 0751  INR 1.0     BMP: Recent Labs    12/26/18 1252 01/08/19 1342 05/11/19 1116  NA 138 139 140  K 3.7 5.1 4.3  CL 102 106 108  CO2 25 24 21*  GLUCOSE 111* 102* 98  BUN 18 20 29*  CALCIUM 9.5 9.3 9.0  CREATININE 1.42* 1.39* 1.30*  GFRNONAA 43* 44* 47*  GFRAA 49* 51* 55*    LIVER FUNCTION TESTS: Recent Labs    12/26/18 1252 01/08/19 1342 05/11/19 1116  BILITOT 1.0 0.5 0.7  AST 24 25 27   ALT 15 21 24   ALKPHOS 111 89 113  PROT 8.2* 7.0 7.1  ALBUMIN 4.9 4.0 4.0    TUMOR MARKERS: No results for input(s): AFPTM, CEA, CA199, CHROMGRNA in the last 8760 hours.  Assessment and Plan:  Multiple pulmonary nodules and liver lesions concerning for metastatic disease of uncertain primary origin.  He is a candidate for ultrasound-guided core biopsy of one of the liver lesions.  1.  Proceed with ultrasound-guided core biopsy of liver lesion.  Thank you for this interesting consult.  I greatly enjoyed meeting CEDRICK AUBRY and look forward to participating in their care.  A copy of this report was sent to the requesting provider on this date.  Electronically Signed: Jacqulynn Cadet, MD 06/06/2019, 9:29 AM   I spent a total of 15 Minutes  in face to face in clinical consultation, greater than 50% of which was counseling/coordinating care for liver lesions.

## 2019-06-06 NOTE — Procedures (Signed)
Interventional Radiology Procedure Note  Procedure: US guided biopsy of liver mass.   Complications: None  Estimated Blood Loss: None  Recommendations: - Bedrest x 2 hrs - DC home   Signed,  Heber Hoog K. Max Romano, MD    

## 2019-06-06 NOTE — Discharge Instructions (Signed)

## 2019-06-08 ENCOUNTER — Other Ambulatory Visit: Payer: Self-pay | Admitting: Anatomic Pathology & Clinical Pathology

## 2019-06-08 LAB — SURGICAL PATHOLOGY

## 2019-06-14 ENCOUNTER — Other Ambulatory Visit: Payer: Medicare Other

## 2019-06-15 ENCOUNTER — Other Ambulatory Visit: Payer: Self-pay

## 2019-06-15 ENCOUNTER — Inpatient Hospital Stay: Payer: Medicare Other | Attending: Oncology | Admitting: Oncology

## 2019-06-15 ENCOUNTER — Encounter: Payer: Self-pay | Admitting: Oncology

## 2019-06-15 VITALS — BP 131/82 | HR 78 | Temp 95.6°F | Resp 18 | Wt 171.2 lb

## 2019-06-15 DIAGNOSIS — N189 Chronic kidney disease, unspecified: Secondary | ICD-10-CM | POA: Diagnosis not present

## 2019-06-15 DIAGNOSIS — C787 Secondary malignant neoplasm of liver and intrahepatic bile duct: Secondary | ICD-10-CM | POA: Diagnosis not present

## 2019-06-15 DIAGNOSIS — N200 Calculus of kidney: Secondary | ICD-10-CM | POA: Insufficient documentation

## 2019-06-15 DIAGNOSIS — C439 Malignant melanoma of skin, unspecified: Secondary | ICD-10-CM | POA: Diagnosis not present

## 2019-06-15 DIAGNOSIS — R918 Other nonspecific abnormal finding of lung field: Secondary | ICD-10-CM | POA: Insufficient documentation

## 2019-06-15 DIAGNOSIS — Z85820 Personal history of malignant melanoma of skin: Secondary | ICD-10-CM | POA: Diagnosis not present

## 2019-06-15 DIAGNOSIS — E785 Hyperlipidemia, unspecified: Secondary | ICD-10-CM | POA: Diagnosis not present

## 2019-06-15 DIAGNOSIS — I700 Atherosclerosis of aorta: Secondary | ICD-10-CM | POA: Insufficient documentation

## 2019-06-15 DIAGNOSIS — C799 Secondary malignant neoplasm of unspecified site: Secondary | ICD-10-CM | POA: Diagnosis not present

## 2019-06-15 DIAGNOSIS — N400 Enlarged prostate without lower urinary tract symptoms: Secondary | ICD-10-CM | POA: Diagnosis not present

## 2019-06-15 DIAGNOSIS — Z7189 Other specified counseling: Secondary | ICD-10-CM | POA: Diagnosis not present

## 2019-06-15 DIAGNOSIS — K5730 Diverticulosis of large intestine without perforation or abscess without bleeding: Secondary | ICD-10-CM | POA: Diagnosis not present

## 2019-06-15 DIAGNOSIS — K219 Gastro-esophageal reflux disease without esophagitis: Secondary | ICD-10-CM | POA: Insufficient documentation

## 2019-06-15 DIAGNOSIS — G8929 Other chronic pain: Secondary | ICD-10-CM | POA: Insufficient documentation

## 2019-06-15 DIAGNOSIS — J984 Other disorders of lung: Secondary | ICD-10-CM | POA: Diagnosis not present

## 2019-06-15 NOTE — Progress Notes (Signed)
Patient here for oncology follow-up appointment, expresses no complaints or concerns at this time. States he was just diagnosed with myeloma.

## 2019-06-15 NOTE — Progress Notes (Signed)
Tumor Board Documentation  Omar Schroeder was presented by Dr Janese Banks at our Tumor Board on 06/14/2019, which included representatives from medical oncology, radiation oncology, navigation, pathology, radiology, surgical, internal medicine, PCP, pharmacy, genetics, pulmonology, palliative care, research.  Omar Schroeder currently presents as a new patient, for [place], for new positive pathology with history of the following treatments: active survellience, surgical intervention(s)(Biopsy).  Additionally, we reviewed previous medical and familial history, history of present illness, and recent lab results along with all available histopathologic and imaging studies. The tumor board considered available treatment options and made the following recommendations: Additional screening, Immunotherapy(PET Scan, MRI Brain)    The following procedures/referrals were also placed: No orders of the defined types were placed in this encounter.   Clinical Trial Status: not discussed   Staging used: AJCC Stage Group  AJCC Staging:       Group: Stage 4 Melanoma   National site-specific guidelines NCCN were discussed with respect to the case.  Tumor board is a meeting of clinicians from various specialty areas who evaluate and discuss patients for whom a multidisciplinary approach is being considered. Final determinations in the plan of care are those of the provider(s). The responsibility for follow up of recommendations given during tumor board is that of the provider.   Today's extended care, comprehensive team conference, Omar Schroeder was not present for the discussion and was not examined.   Multidisciplinary Tumor Board is a multidisciplinary case peer review process.  Decisions discussed in the Multidisciplinary Tumor Board reflect the opinions of the specialists present at the conference without having examined the patient.  Ultimately, treatment and diagnostic decisions rest with the primary provider(s) and the  patient.

## 2019-06-16 ENCOUNTER — Ambulatory Visit
Admission: RE | Admit: 2019-06-16 | Discharge: 2019-06-16 | Disposition: A | Discharge status: Home / Self Care | Payer: Medicare Other | Source: Ambulatory Visit | Attending: Oncology | Admitting: Oncology

## 2019-06-16 ENCOUNTER — Ambulatory Visit: Admission: RE | Admit: 2019-06-16 | Payer: Medicare Other | Source: Ambulatory Visit

## 2019-06-16 DIAGNOSIS — C439 Malignant melanoma of skin, unspecified: Secondary | ICD-10-CM

## 2019-06-16 DIAGNOSIS — C799 Secondary malignant neoplasm of unspecified site: Secondary | ICD-10-CM | POA: Diagnosis not present

## 2019-06-16 MED ORDER — GADOBUTROL 1 MMOL/ML IV SOLN
7.5000 mL | Freq: Once | INTRAVENOUS | Status: AC | PRN
  Administered 2019-06-16: 7.5 mL via INTRAVENOUS

## 2019-06-18 ENCOUNTER — Telehealth (INDEPENDENT_AMBULATORY_CARE_PROVIDER_SITE_OTHER): Payer: Self-pay

## 2019-06-18 DIAGNOSIS — C439 Malignant melanoma of skin, unspecified: Secondary | ICD-10-CM | POA: Insufficient documentation

## 2019-06-18 MED ORDER — ONDANSETRON HCL 8 MG PO TABS: 8.0000 mg | ORAL_TABLET | Freq: Two times a day (BID) | ORAL | 1 refills | Status: DC | PRN

## 2019-06-18 MED ORDER — LIDOCAINE-PRILOCAINE 2.5-2.5 % EX CREA: TOPICAL_CREAM | CUTANEOUS | 3 refills | Status: DC

## 2019-06-18 MED ORDER — PROCHLORPERAZINE MALEATE 10 MG PO TABS: 10.0000 mg | ORAL_TABLET | Freq: Four times a day (QID) | ORAL | 1 refills | Status: DC | PRN

## 2019-06-18 NOTE — Progress Notes (Signed)
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 He has no significant comorbidities other than hyperlipidemia and GERD. He has a history of superficial skin cancer. He presented to the ER with symptoms of right-sided flank pain and had a CT renal stone study which showed a 2.1 x 1.1 cm right lower lobe pulmonary nodule. Also noted to have multiple new ill-defined lesions throughout the right lobe of the liver largest measuring 3.4 x 2.8 cm. Spleen and pancreas was unremarkable. Low-attenuation lesion in the right kidney 2.6 x 1.2 cm likely representing a cyst. No intra-abdominal adenopathy. Prostate gland is enlarged measuring 6.2 x 6.5 x 5.3 cm.Patient has some baseline CKD and his creatinine is around 1.3.   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  Appetite and weight are stable.  Backslash flank pain relatively well controlled with tramadol  ECOG PS- 1 Pain scale- 5 Opioid associated constipation- no  Review of systems- Review of Systems  Constitutional: Negative for chills, fever, malaise/fatigue and weight loss.  HENT: Negative for congestion, ear discharge and nosebleeds.   Eyes: Negative for blurred vision.  Respiratory: Negative for cough, hemoptysis, sputum  production, shortness of breath and wheezing.   Cardiovascular: Negative for chest pain, palpitations, orthopnea and claudication.  Gastrointestinal: Negative for abdominal pain, blood in stool, constipation, diarrhea, heartburn, melena, nausea and vomiting.  Genitourinary: Negative for dysuria, flank pain, frequency, hematuria and urgency.  Musculoskeletal: Positive for back pain. Negative for joint pain and myalgias.  Skin: Negative for rash.  Neurological: Negative for dizziness, tingling, focal weakness, seizures, weakness and headaches.  Endo/Heme/Allergies: Does not bruise/bleed easily.  Psychiatric/Behavioral: Negative for depression and suicidal ideas. The patient does not have insomnia.        No Known Allergies   Past Medical History:  Diagnosis Date  . Cancer ([place])    skin melanoma     Past Surgical History:  Procedure Laterality Date  . HERNIA REPAIR    . MELANOMA EXCISION      Social History   Socioeconomic History  . Marital status: Married    Spouse name: Not on file  . Number of children: Not on file  . Years of education: Not on file  . Highest education level: Not on file  Occupational History  . Not on file  Tobacco Use  . Smoking status: Never Smoker  . Smokeless tobacco: Never Used  Substance and Sexual Activity  . Alcohol use: Never  . Drug use: Not on file  . Sexual activity: Not on file  Other Topics Concern  . Not on file  Social History Narrative  . Not on file   Social Determinants of Health   Financial Resource Strain:   . Difficulty of Paying Living Expenses:   Food Insecurity:   . Worried About Charity fundraiser in the Last Year:   .  Ran Out of Food in the Last Year:   Transportation Needs:   . Film/video editor (Medical):   Marland Kitchen Lack of Transportation (Non-Medical):   Physical Activity:   . Days of Exercise per Week:   . Minutes of Exercise per Session:   Stress:   . Feeling of Stress :   Social Connections:   .  Frequency of Communication with Friends and Family:   . Frequency of Social Gatherings with Friends and Family:   . Attends Religious Services:   . Active Member of Clubs or Organizations:   . Attends Archivist Meetings:   Marland Kitchen Marital Status:   Intimate Partner Violence:   . Fear of Current or Ex-Partner:   . Emotionally Abused:   Marland Kitchen Physically Abused:   . Sexually Abused:     History reviewed. No pertinent family history.   Current Outpatient Medications:  .  acetaminophen (TYLENOL) 325 MG tablet, Take 650 mg by mouth every 6 (six) hours as needed., Disp: , Rfl:  .  traMADol (ULTRAM) 50 MG tablet, Take 1 tablet (50 mg total) by mouth every 6 (six) hours as needed., Disp: 60 tablet, Rfl: 0 .  Loratadine 10 MG CAPS, Take by mouth., Disp: , Rfl:   Physical exam:  Vitals:   06/15/19 1020  BP: 131/82  Pulse: 78  Resp: 18  Temp: (!) 95.6 F (35.3 C)  TempSrc: Tympanic  SpO2: 96%  Weight: 171 lb 3.2 oz (77.7 kg)   Physical Exam Cardiovascular:     Rate and Rhythm: Normal rate and regular rhythm.     Heart sounds: Normal heart sounds.  Pulmonary:     Effort: Pulmonary effort is normal.     Breath sounds: Normal breath sounds.  Abdominal:     General: Bowel sounds are normal.     Palpations: Abdomen is soft.  Skin:    General: Skin is warm and dry.  Neurological:     Mental Status: He is alert and oriented to person, place, and time.      CMP Latest Ref Rng & Units 05/11/2019  Glucose 70 - 99 mg/dL 98  BUN 8 - 23 mg/dL 29(H)  Creatinine 0.61 - 1.24 mg/dL 1.30(H)  Sodium 135 - 145 mmol/L 140  Potassium 3.5 - 5.1 mmol/L 4.3  Chloride 98 - 111 mmol/L 108  CO2 22 - 32 mmol/L 21(L)  Calcium 8.9 - 10.3 mg/dL 9.0  Total Protein 6.5 - 8.1 g/dL 7.1  Total Bilirubin 0.3 - 1.2 mg/dL 0.7  Alkaline Phos 38 - 126 U/L 113  AST 15 - 41 U/L 27  ALT 0 - 44 U/L 24   CBC Latest Ref Rng & Units 06/06/2019  WBC 4.0 - 10.5 K/uL 8.0  Hemoglobin 13.0 - 17.0 g/dL 13.5    Hematocrit 39.0 - 52.0 % 40.7  Platelets 150 - 400 K/uL 210    No images are attached to the encounter.  CT Chest W Contrast  Result Date: 05/29/2019 CLINICAL DATA:  Evaluate lung mass and liver lesions found on prior renal stone study EXAM: CT CHEST, ABDOMEN, AND PELVIS WITH CONTRAST TECHNIQUE: Multidetector CT imaging of the chest, abdomen and pelvis was performed following the standard protocol during bolus administration of intravenous contrast. CONTRAST:  178m OMNIPAQUE IOHEXOL 300 MG/ML SOLN, additional oral enteric contrast COMPARISON:  CT abdomen pelvis, 05/11/2019 FINDINGS: CT CHEST FINDINGS Cardiovascular: Aortic atherosclerosis. Normal heart size. No pericardial effusion. Mediastinum/Nodes: No enlarged mediastinal, hilar, or axillary lymph nodes. Thyroid gland, trachea,  and esophagus demonstrate no significant findings. Lungs/Pleura: Eventration of the right hemidiaphragm with scarring and or atelectasis of right lower and middle lobes. There are multiple nodules of the right lung, the largest in the inferior azygoesophageal recess measuring 2.4 x 1.9 cm (series 2, image 37). Musculoskeletal: No chest wall mass or suspicious bone lesions identified. Redemonstrated wedge deformities of T9 and T12. Chronic fracture deformity of the manubrium (series 6, image 88). CT ABDOMEN PELVIS FINDINGS Hepatobiliary: There are numerous hypodense masses throughout the liver in addition to at least 1 simple appearing incidental cyst. The largest index lesion of the inferior right lobe of the liver measures 3.5 x 2.8 cm (series 2, image 57). No gallstones, gallbladder wall thickening, or biliary dilatation. Pancreas: Unremarkable. No pancreatic ductal dilatation or surrounding inflammatory changes. Spleen: Normal in size without significant abnormality. Adrenals/Urinary Tract: Adrenal glands are unremarkable. Kidneys are normal, without renal calculi, solid lesion, or hydronephrosis. Bladder is unremarkable.  Stomach/Bowel: There is ill-defined soft tissue thickening at the gastroesophageal junction (series 2, image 55). Appendix appears normal. No evidence of bowel wall thickening, distention, or inflammatory changes. Sigmoid diverticulosis. Vascular/Lymphatic: Aortic atherosclerosis. No enlarged abdominal or pelvic lymph nodes. Reproductive: Prostatomegaly. Other: No abdominal wall hernia or abnormality. No abdominopelvic ascites. Musculoskeletal: Redemonstrated superior endplate deformity of L3. bilateral pars defects of L5 with anterolisthesis of L5 on S1. IMPRESSION: 1. There are multiple nodules of the right lung, consistent with pulmonary metastatic disease. 2. There are numerous hypodense masses throughout the liver, consistent with hepatic metastatic disease. 3. There is ill-defined soft tissue thickening at the gastroesophageal junction, suspicious for primary esophageal malignancy. Correlate with endoscopic findings. 4. Eventration of the right hemidiaphragm with scarring and/or atelectasis of right lower and middle lobes. 5. Sigmoid diverticulosis without evidence of acute diverticulitis. 6. Prostatomegaly. 7. Aortic Atherosclerosis (ICD10-I70.0). Electronically Signed   By: Eddie Candle M.D.   On: 05/29/2019 11:14   CT Abdomen Pelvis W Contrast  Result Date: 05/29/2019 CLINICAL DATA:  Evaluate lung mass and liver lesions found on prior renal stone study EXAM: CT CHEST, ABDOMEN, AND PELVIS WITH CONTRAST TECHNIQUE: Multidetector CT imaging of the chest, abdomen and pelvis was performed following the standard protocol during bolus administration of intravenous contrast. CONTRAST:  126m OMNIPAQUE IOHEXOL 300 MG/ML SOLN, additional oral enteric contrast COMPARISON:  CT abdomen pelvis, 05/11/2019 FINDINGS: CT CHEST FINDINGS Cardiovascular: Aortic atherosclerosis. Normal heart size. No pericardial effusion. Mediastinum/Nodes: No enlarged mediastinal, hilar, or axillary lymph nodes. Thyroid gland, trachea, and  esophagus demonstrate no significant findings. Lungs/Pleura: Eventration of the right hemidiaphragm with scarring and or atelectasis of right lower and middle lobes. There are multiple nodules of the right lung, the largest in the inferior azygoesophageal recess measuring 2.4 x 1.9 cm (series 2, image 37). Musculoskeletal: No chest wall mass or suspicious bone lesions identified. Redemonstrated wedge deformities of T9 and T12. Chronic fracture deformity of the manubrium (series 6, image 88). CT ABDOMEN PELVIS FINDINGS Hepatobiliary: There are numerous hypodense masses throughout the liver in addition to at least 1 simple appearing incidental cyst. The largest index lesion of the inferior right lobe of the liver measures 3.5 x 2.8 cm (series 2, image 57). No gallstones, gallbladder wall thickening, or biliary dilatation. Pancreas: Unremarkable. No pancreatic ductal dilatation or surrounding inflammatory changes. Spleen: Normal in size without significant abnormality. Adrenals/Urinary Tract: Adrenal glands are unremarkable. Kidneys are normal, without renal calculi, solid lesion, or hydronephrosis. Bladder is unremarkable. Stomach/Bowel: There is ill-defined soft tissue thickening at the gastroesophageal junction (series  2, image 55). Appendix appears normal. No evidence of bowel wall thickening, distention, or inflammatory changes. Sigmoid diverticulosis. Vascular/Lymphatic: Aortic atherosclerosis. No enlarged abdominal or pelvic lymph nodes. Reproductive: Prostatomegaly. Other: No abdominal wall hernia or abnormality. No abdominopelvic ascites. Musculoskeletal: Redemonstrated superior endplate deformity of L3. bilateral pars defects of L5 with anterolisthesis of L5 on S1. IMPRESSION: 1. There are multiple nodules of the right lung, consistent with pulmonary metastatic disease. 2. There are numerous hypodense masses throughout the liver, consistent with hepatic metastatic disease. 3. There is ill-defined soft tissue  thickening at the gastroesophageal junction, suspicious for primary esophageal malignancy. Correlate with endoscopic findings. 4. Eventration of the right hemidiaphragm with scarring and/or atelectasis of right lower and middle lobes. 5. Sigmoid diverticulosis without evidence of acute diverticulitis. 6. Prostatomegaly. 7. Aortic Atherosclerosis (ICD10-I70.0). Electronically Signed   By: Eddie Candle M.D.   On: 05/29/2019 11:14   US BIOPSY (LIVER)  Result Date: 06/06/2019 INDICATION: 86 year old male with multiple pulmonary nodules and liver lesions concerning for metastatic disease. He presents for ultrasound-guided core biopsy of 1 of the liver lesions. EXAM: ULTRASOUND BIOPSY CORE LIVER MEDICATIONS: None. ANESTHESIA/SEDATION: Moderate (conscious) sedation was employed during this procedure. A total of Versed 0.5 mg and Fentanyl 50 mcg was administered intravenously. Moderate Sedation Time: 10 minutes. The patient's level of consciousness and vital signs were monitored continuously by radiology nursing throughout the procedure under my direct supervision. FLUOROSCOPY TIME:  None. COMPLICATIONS: None immediate. PROCEDURE: Informed written consent was obtained from the patient after a thorough discussion of the procedural risks, benefits and alternatives. All questions were addressed. A timeout was performed prior to the initiation of the procedure. Ultrasound was used to interrogate the liver. There are numerous hypoechoic solid masses with areas of internal cystic degeneration scattered throughout the liver. A suitable lesion in the posterior right hepatic lobe was identified. A skin entry site was selected and marked. The overlying skin was sterilely prepped and draped in the standard fashion using chlorhexidine skin prep. Local anesthesia was attained by infiltration with 1% lidocaine. A small dermatotomy was made. Under real-time ultrasound guidance, a 17 gauge introducer needle was advanced into the margin  of the mass. Multiple 18 gauge core biopsies were then coaxially obtained using the bio Pince automated biopsy device. Biopsy specimens were placed in formalin and delivered to pathology for further analysis. As the introducer needle was removed, the biopsy tract was embolized with a Gel-Foam slurry. No evidence of postprocedural hemorrhage or complication. The patient tolerated the procedure well. IMPRESSION: Ultrasound-guided core biopsy of liver lesion. Electronically Signed   By: Jacqulynn Cadet M.D.   On: 06/06/2019 10:04     Assessment and plan- Patient is a 86 y.o. male with newly diagnosed stage IV malignant melanoma with lung and liver metastases  1.  I discussed the results of liver biopsy with the patient and his granddaughter Marzetta Board in detail today.  Liver biopsy is consistent with malignant melanoma.  I will plan to send NGS omniseq testign to look for BRAF and any other targetable mutations for use down the line  2.  Patient and his granddaughter understand that he has stage IV disease and treatment will be palliative and not curative.  However stage IV malignant melanoma can have good response rates with immunotherapy .  Even if patient has BRAF mutation immunotherapy would be preferred over targeted therapy as first-line.  At his age I would worry about giving combination nivolumab + ipilimumab given risk of cytotoxic side effects.  I would therefore prefer single agent pembrolizumab based on phase 3 keynote 006 trial.  In this trial 060 patients without prior exposure to immunotherapy were randomly assigned to pembrolizumab versus ipilimumab.  In the BRAF V6 100 mutant disease objective response rate with pembrolizumab was 47% and median follow-up of 58 months OS plus not reached for pembrolizumab versus 26 months for ipilimumab.  At median follow-up of 5 years overall survival with pembrolizumab was 33 months versus 16 months for ipilimumab.  PFS 8.4 versus 3.4 months.  At baseline  patient does not have any autoimmune medical issues.  Discussed risks and benefits of Keytruda including all but not limited to autoimmune side effect such as thyroiditis, colitis, need to monitor kidney and liver functions, endocrinopathies and pneumonitis.  Treatment will be given with a palliative intent.  I will initially plan to give him 200 mg IV every 3 weeks until progression or toxicity.  If he tolerates it well I will consider giving it IV every 6 weeks for ease of infusion.  He will need a port placement prior to treatment and I will tentatively plan to start treatment on 07/02/2019.  Patient needs to attend his grand daughter wedding and his treatment is being postponed because of that.  I will hold off on getting a PET CT scan at this time but proceed with MRI brain with and without contrast.  He will also need chemo teach  I will see him on 07/02/2019 with CBC with differential, CMP, LDH and TSH for start of Keytruda.  Patient does have baseline CKD with a creatinine of 1.3 which we will continue to monitor   Total face to face encounter time for this patient visit was 40 min.     Visit Diagnosis 1. Metastatic melanoma ([place])   2. Goals of care, counseling/discussion      Dr. Randa Evens, MD, MPH [facility] [phone] 06/18/2019 9:04 AM

## 2019-06-18 NOTE — Progress Notes (Signed)
START ON PATHWAY REGIMEN - Melanoma and Other Skin Cancers     A cycle is 21 days:     Pembrolizumab   **Always confirm dose/schedule in your pharmacy ordering system**  Patient Characteristics: Melanoma, Cutaneous/Unknown Primary, Distant Metastases or Unresectable Local Recurrence, Unresectable, Asymptomatic, First Line, BRAF V600 Wild Type / BRAF V600 Results Pending or Unknown Disease Classification: Melanoma Disease Subtype: Cutaneous BRAF V600 Mutation Status: Awaiting BRAF V600 Results Therapeutic Status: Distant Metastases Metastatic Disease Type: Asymptomatic Line of Therapy: First Line Intent of Therapy: Non-Curative / Palliative Intent, Discussed with Patient 

## 2019-06-18 NOTE — Telephone Encounter (Signed)
Spoke with the patient and he is now scheduled with Dr. Lucky Cowboy for a Port placement on 06/28/19 with a 6:45 am arrival time to the [facility]. Patient will do a covid test on 06/26/19 between 8-1 pm at the [facility]. Pre-procedure instructions were discussed and will be mailed to the patient.

## 2019-06-19 ENCOUNTER — Other Ambulatory Visit: Payer: Self-pay | Admitting: Oncology

## 2019-06-20 NOTE — Patient Instructions (Signed)
Pembrolizumab injection What is this medicine? PEMBROLIZUMAB (pem broe liz ue mab) is a monoclonal antibody. It is used to treat certain types of cancer. This medicine may be used for other purposes; ask your health care provider or pharmacist if you have questions. COMMON BRAND NAME(S): Keytruda What should I tell my health care provider before I take this medicine? They need to know if you have any of these conditions:  diabetes  immune system problems  inflammatory bowel disease  liver disease  lung or breathing disease  lupus  received or scheduled to receive an organ transplant or a stem-cell transplant that uses donor stem cells  an unusual or allergic reaction to pembrolizumab, other medicines, foods, dyes, or preservatives  pregnant or trying to get pregnant  breast-feeding How should I use this medicine? This medicine is for infusion into a vein. It is given by a health care professional in a hospital or clinic setting. A special MedGuide will be given to you before each treatment. Be sure to read this information carefully each time. Talk to your pediatrician regarding the use of this medicine in children. While this drug may be prescribed for children as young as 6 months for selected conditions, precautions do apply. Overdosage: If you think you have taken too much of this medicine contact a poison control center or emergency room at once. NOTE: This medicine is only for you. Do not share this medicine with others. What if I miss a dose? It is important not to miss your dose. Call your doctor or health care professional if you are unable to keep an appointment. What may interact with this medicine? Interactions have not been studied. Give your health care provider a list of all the medicines, herbs, non-prescription drugs, or dietary supplements you use. Also tell them if you smoke, drink alcohol, or use illegal drugs. Some items may interact with your medicine. This  list may not describe all possible interactions. Give your health care provider a list of all the medicines, herbs, non-prescription drugs, or dietary supplements you use. Also tell them if you smoke, drink alcohol, or use illegal drugs. Some items may interact with your medicine. What should I watch for while using this medicine? Your condition will be monitored carefully while you are receiving this medicine. You may need blood work done while you are taking this medicine. Do not become pregnant while taking this medicine or for 4 months after stopping it. Women should inform their doctor if they wish to become pregnant or think they might be pregnant. There is a potential for serious side effects to an unborn child. Talk to your health care professional or pharmacist for more information. Do not breast-feed an infant while taking this medicine or for 4 months after the last dose. What side effects may I notice from receiving this medicine? Side effects that you should report to your doctor or health care professional as soon as possible:  allergic reactions like skin rash, itching or hives, swelling of the face, lips, or tongue  bloody or black, tarry  breathing problems  changes in vision  chest pain  chills  confusion  constipation  cough  diarrhea  dizziness or feeling faint or lightheaded  fast or irregular heartbeat  fever  flushing  joint pain  low blood counts - this medicine may decrease the number of white blood cells, red blood cells and platelets. You may be at increased risk for infections and bleeding.  muscle pain  muscle   weakness  pain, tingling, numbness in the hands or feet  persistent headache  redness, blistering, peeling or loosening of the skin, including inside the mouth  signs and symptoms of high blood sugar such as dizziness; dry mouth; dry skin; fruity breath; nausea; stomach pain; increased hunger or thirst; increased urination  signs  and symptoms of kidney injury like trouble passing urine or change in the amount of urine  signs and symptoms of liver injury like dark urine, light-colored stools, loss of appetite, nausea, right upper belly pain, yellowing of the eyes or skin  sweating  swollen lymph nodes  weight loss Side effects that usually do not require medical attention (report to your doctor or health care professional if they continue or are bothersome):  decreased appetite  hair loss  muscle pain  tiredness This list may not describe all possible side effects. Call your doctor for medical advice about side effects. You may report side effects to FDA at 1-800-FDA-1088. Where should I keep my medicine? This drug is given in a hospital or clinic and will not be stored at home. NOTE: This sheet is a summary. It may not cover all possible information. If you have questions about this medicine, talk to your doctor, pharmacist, or health care provider.  2020 Elsevier/Gold Standard (2018-11-03 18:07:58)  

## 2019-06-21 ENCOUNTER — Other Ambulatory Visit: Payer: Self-pay

## 2019-06-21 ENCOUNTER — Inpatient Hospital Stay (HOSPITAL_BASED_OUTPATIENT_CLINIC_OR_DEPARTMENT_OTHER): Payer: Medicare Other | Admitting: Oncology

## 2019-06-21 ENCOUNTER — Inpatient Hospital Stay: Payer: Medicare Other

## 2019-06-21 DIAGNOSIS — C439 Malignant melanoma of skin, unspecified: Secondary | ICD-10-CM | POA: Diagnosis not present

## 2019-06-21 DIAGNOSIS — C799 Secondary malignant neoplasm of unspecified site: Secondary | ICD-10-CM

## 2019-06-22 NOTE — Progress Notes (Signed)
[place]  Telephone:([phone] [phone]  Patient Care Team: Maryland Pink, MD as PCP - General (Family Medicine)   Name of the patient: Cohen Boettner  [id number]  October 08, 1924   Date of visit: 06/22/19  Diagnosis-melanoma  Chief complaint/Reason for visit- Initial Meeting for [facility], preparing for starting chemotherapy  Heme/Onc history:  Oncology History  Metastatic melanoma ([place])  06/18/2019 Initial Diagnosis   Metastatic melanoma ([place])   06/19/2019 -  Chemotherapy   The patient had pembrolizumab (KEYTRUDA) 200 mg in sodium chloride 0.9 % 50 mL chemo infusion, 200 mg, Intravenous, Once, 0 of 6 cycles  for chemotherapy treatment.      Interval history-Mr. Zarrella is a 86 year old male who presents to chemo care clinic today for initial meeting in preparation for starting chemotherapy. I introduced the chemo care clinic and we discussed that the role of the clinic is to assist those who are at an increased risk of emergency room visits and/or complications during the course of chemotherapy treatment. We discussed that the increased risk takes into account factors such as age, performance status, and co-morbidities. We also discussed that for some, this might include barriers to care such as not having a primary care provider, lack of insurance/transportation, or not being able to afford medications. We discussed that the goal of the program is to help prevent unplanned ER visits and help reduce complications during chemotherapy. We do this by discussing specific risk factors to each individual and identifying ways that we can help improve these risk factors and reduce barriers to care.   ECOG FS:0 - Asymptomatic  Review of systems- Review of Systems  Constitutional: Negative.  Negative for chills, fever, malaise/fatigue and weight loss.  HENT: Negative for congestion, ear pain and tinnitus.   Eyes:  Negative.  Negative for blurred vision and double vision.  Respiratory: Negative.  Negative for cough, sputum production and shortness of breath.   Cardiovascular: Negative.  Negative for chest pain, palpitations and leg swelling.  Gastrointestinal: Negative.  Negative for abdominal pain, constipation, diarrhea, nausea and vomiting.  Genitourinary: Negative for dysuria, frequency and urgency.  Musculoskeletal: Positive for back pain. Negative for falls.  Skin: Negative.  Negative for rash.  Neurological: Negative.  Negative for weakness and headaches.  Endo/Heme/Allergies: Negative.  Does not bruise/bleed easily.  Psychiatric/Behavioral: Negative.  Negative for depression. The patient is not nervous/anxious and does not have insomnia.      Current treatment-Keytruda  No Known Allergies  Past Medical History:  Diagnosis Date   Cancer ([place])    skin melanoma    Past Surgical History:  Procedure Laterality Date   HERNIA REPAIR     MELANOMA EXCISION      Social History   Socioeconomic History   Marital status: Married    Spouse name: Not on file   Number of children: Not on file   Years of education: Not on file   Highest education level: Not on file  Occupational History   Not on file  Tobacco Use   Smoking status: Never Smoker   Smokeless tobacco: Never Used  Substance and Sexual Activity   Alcohol use: Never   Drug use: Not on file   Sexual activity: Not on file  Other Topics Concern   Not on file  Social History Narrative   Not on file   Social Determinants of Health   Financial Resource Strain:    Difficulty of Paying Living Expenses:  Food Insecurity:    Worried About Charity fundraiser in the Last Year:    Arboriculturist in the Last Year:   Transportation Needs:    Film/video editor (Medical):    Lack of Transportation (Non-Medical):   Physical Activity:    Days of Exercise per Week:    Minutes of Exercise per Session:     Stress:    Feeling of Stress :   Social Connections:    Frequency of Communication with Friends and Family:    Frequency of Social Gatherings with Friends and Family:    Attends Religious Services:    Active Member of Clubs or Organizations:    Attends Music therapist:    Marital Status:   Intimate Partner Violence:    Fear of Current or Ex-Partner:    Emotionally Abused:    Physically Abused:    Sexually Abused:     No family history on file.   Current Outpatient Medications:    acetaminophen (TYLENOL) 325 MG tablet, Take 650 mg by mouth every 6 (six) hours as needed., Disp: , Rfl:    lidocaine-prilocaine (EMLA) cream, Apply to affected area once, Disp: 30 g, Rfl: 3   Loratadine 10 MG CAPS, Take by mouth., Disp: , Rfl:    ondansetron (ZOFRAN) 8 MG tablet, Take 1 tablet (8 mg total) by mouth 2 (two) times daily as needed (Nausea or vomiting)., Disp: 30 tablet, Rfl: 1   prochlorperazine (COMPAZINE) 10 MG tablet, Take 1 tablet (10 mg total) by mouth every 6 (six) hours as needed (Nausea or vomiting)., Disp: 30 tablet, Rfl: 1   traMADol (ULTRAM) 50 MG tablet, TAKE 1 TABLET (50 MG TOTAL) BY MOUTH EVERY 6 (SIX) HOURS AS NEEDED., Disp: 60 tablet, Rfl: 0  Physical exam: There were no vitals filed for this visit. Physical Exam Constitutional:      Appearance: Normal appearance.  HENT:     Head: Normocephalic and atraumatic.  Eyes:     Pupils: Pupils are equal, round, and reactive to light.  Cardiovascular:     Rate and Rhythm: Normal rate and regular rhythm.     Heart sounds: Normal heart sounds. No murmur heard.   Pulmonary:     Effort: Pulmonary effort is normal.     Breath sounds: Normal breath sounds. No wheezing.  Abdominal:     General: Bowel sounds are normal. There is no distension.     Palpations: Abdomen is soft.     Tenderness: There is no abdominal tenderness.  Musculoskeletal:        General: Normal range of motion.     Cervical  back: Normal range of motion.  Skin:    General: Skin is warm and dry.     Findings: No rash.  Neurological:     Mental Status: He is alert and oriented to person, place, and time.  Psychiatric:        Judgment: Judgment normal.      CMP Latest Ref Rng & Units 05/11/2019  Glucose 70 - 99 mg/dL 98  BUN 8 - 23 mg/dL 29(H)  Creatinine 0.61 - 1.24 mg/dL 1.30(H)  Sodium 135 - 145 mmol/L 140  Potassium 3.5 - 5.1 mmol/L 4.3  Chloride 98 - 111 mmol/L 108  CO2 22 - 32 mmol/L 21(L)  Calcium 8.9 - 10.3 mg/dL 9.0  Total Protein 6.5 - 8.1 g/dL 7.1  Total Bilirubin 0.3 - 1.2 mg/dL 0.7  Alkaline Phos 38 - 126 U/L 113  AST 15 - 41 U/L 27  ALT 0 - 44 U/L 24   CBC Latest Ref Rng & Units 06/06/2019  WBC 4.0 - 10.5 K/uL 8.0  Hemoglobin 13.0 - 17.0 g/dL 13.5  Hematocrit 39 - 52 % 40.7  Platelets 150 - 400 K/uL 210    No images are attached to the encounter.  CT Chest W Contrast  Result Date: 05/29/2019 CLINICAL DATA:  Evaluate lung mass and liver lesions found on prior renal stone study EXAM: CT CHEST, ABDOMEN, AND PELVIS WITH CONTRAST TECHNIQUE: Multidetector CT imaging of the chest, abdomen and pelvis was performed following the standard protocol during bolus administration of intravenous contrast. CONTRAST:  163mL OMNIPAQUE IOHEXOL 300 MG/ML SOLN, additional oral enteric contrast COMPARISON:  CT abdomen pelvis, 05/11/2019 FINDINGS: CT CHEST FINDINGS Cardiovascular: Aortic atherosclerosis. Normal heart size. No pericardial effusion. Mediastinum/Nodes: No enlarged mediastinal, hilar, or axillary lymph nodes. Thyroid gland, trachea, and esophagus demonstrate no significant findings. Lungs/Pleura: Eventration of the right hemidiaphragm with scarring and or atelectasis of right lower and middle lobes. There are multiple nodules of the right lung, the largest in the inferior azygoesophageal recess measuring 2.4 x 1.9 cm (series 2, image 37). Musculoskeletal: No chest wall mass or suspicious bone lesions  identified. Redemonstrated wedge deformities of T9 and T12. Chronic fracture deformity of the manubrium (series 6, image 88). CT ABDOMEN PELVIS FINDINGS Hepatobiliary: There are numerous hypodense masses throughout the liver in addition to at least 1 simple appearing incidental cyst. The largest index lesion of the inferior right lobe of the liver measures 3.5 x 2.8 cm (series 2, image 57). No gallstones, gallbladder wall thickening, or biliary dilatation. Pancreas: Unremarkable. No pancreatic ductal dilatation or surrounding inflammatory changes. Spleen: Normal in size without significant abnormality. Adrenals/Urinary Tract: Adrenal glands are unremarkable. Kidneys are normal, without renal calculi, solid lesion, or hydronephrosis. Bladder is unremarkable. Stomach/Bowel: There is ill-defined soft tissue thickening at the gastroesophageal junction (series 2, image 55). Appendix appears normal. No evidence of bowel wall thickening, distention, or inflammatory changes. Sigmoid diverticulosis. Vascular/Lymphatic: Aortic atherosclerosis. No enlarged abdominal or pelvic lymph nodes. Reproductive: Prostatomegaly. Other: No abdominal wall hernia or abnormality. No abdominopelvic ascites. Musculoskeletal: Redemonstrated superior endplate deformity of L3. bilateral pars defects of L5 with anterolisthesis of L5 on S1. IMPRESSION: 1. There are multiple nodules of the right lung, consistent with pulmonary metastatic disease. 2. There are numerous hypodense masses throughout the liver, consistent with hepatic metastatic disease. 3. There is ill-defined soft tissue thickening at the gastroesophageal junction, suspicious for primary esophageal malignancy. Correlate with endoscopic findings. 4. Eventration of the right hemidiaphragm with scarring and/or atelectasis of right lower and middle lobes. 5. Sigmoid diverticulosis without evidence of acute diverticulitis. 6. Prostatomegaly. 7. Aortic Atherosclerosis (ICD10-I70.0).  Electronically Signed   By: Eddie Candle M.D.   On: 05/29/2019 11:14   MR Brain W Wo Contrast  Result Date: 06/18/2019 CLINICAL DATA:  Metastatic melanoma EXAM: MRI HEAD WITHOUT AND WITH CONTRAST TECHNIQUE: Multiplanar, multiecho pulse sequences of the brain and surrounding structures were obtained without and with intravenous contrast. CONTRAST:  7.61mL GADAVIST GADOBUTROL 1 MMOL/ML IV SOLN COMPARISON:  None. FINDINGS: Brain: There is no acute infarction or intracranial hemorrhage. There is no intracranial mass, mass effect, or edema. There is no hydrocephalus or extra-axial fluid collection. 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 Vascular: Major vessel flow voids at the skull base are preserved. Skull and upper cervical spine: Normal  marrow signal is preserved. Sinuses/Orbits: Mild mucosal thickening.  Orbits are unremarkable. Other: Sella is unremarkable.  Mastoid air cells are clear. IMPRESSION: No evidence of intracranial metastatic disease. 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 Electronically Signed   By: Macy Mis M.D.   On: 06/18/2019 09:41   CT Abdomen Pelvis W Contrast  Result Date: 05/29/2019 CLINICAL DATA:  Evaluate lung mass and liver lesions found on prior renal stone study EXAM: CT CHEST, ABDOMEN, AND PELVIS WITH CONTRAST TECHNIQUE: Multidetector CT imaging of the chest, abdomen and pelvis was performed following the standard protocol during bolus administration of intravenous contrast. CONTRAST:  14mL OMNIPAQUE IOHEXOL 300 MG/ML SOLN, additional oral enteric contrast COMPARISON:  CT abdomen pelvis, 05/11/2019 FINDINGS: CT CHEST FINDINGS Cardiovascular: Aortic atherosclerosis. Normal heart size. No pericardial effusion. Mediastinum/Nodes: No enlarged mediastinal, hilar, or axillary lymph nodes. Thyroid gland, trachea,  and esophagus demonstrate no significant findings. Lungs/Pleura: Eventration of the right hemidiaphragm with scarring and or atelectasis of right lower and middle lobes. There are multiple nodules of the right lung, the largest in the inferior azygoesophageal recess measuring 2.4 x 1.9 cm (series 2, image 37). Musculoskeletal: No chest wall mass or suspicious bone lesions identified. Redemonstrated wedge deformities of T9 and T12. Chronic fracture deformity of the manubrium (series 6, image 88). CT ABDOMEN PELVIS FINDINGS Hepatobiliary: There are numerous hypodense masses throughout the liver in addition to at least 1 simple appearing incidental cyst. The largest index lesion of the inferior right lobe of the liver measures 3.5 x 2.8 cm (series 2, image 57). No gallstones, gallbladder wall thickening, or biliary dilatation. Pancreas: Unremarkable. No pancreatic ductal dilatation or surrounding inflammatory changes. Spleen: Normal in size without significant abnormality. Adrenals/Urinary Tract: Adrenal glands are unremarkable. Kidneys are normal, without renal calculi, solid lesion, or hydronephrosis. Bladder is unremarkable. Stomach/Bowel: There is ill-defined soft tissue thickening at the gastroesophageal junction (series 2, image 55). Appendix appears normal. No evidence of bowel wall thickening, distention, or inflammatory changes. Sigmoid diverticulosis. Vascular/Lymphatic: Aortic atherosclerosis. No enlarged abdominal or pelvic lymph nodes. Reproductive: Prostatomegaly. Other: No abdominal wall hernia or abnormality. No abdominopelvic ascites. Musculoskeletal: Redemonstrated superior endplate deformity of L3. bilateral pars defects of L5 with anterolisthesis of L5 on S1. IMPRESSION: 1. There are multiple nodules of the right lung, consistent with pulmonary metastatic disease. 2. There are numerous hypodense masses throughout the liver, consistent with hepatic metastatic disease. 3. There is ill-defined soft  tissue thickening at the gastroesophageal junction, suspicious for primary esophageal malignancy. Correlate with endoscopic findings. 4. Eventration of the right hemidiaphragm with scarring and/or atelectasis of right lower and middle lobes. 5. Sigmoid diverticulosis without evidence of acute diverticulitis. 6. Prostatomegaly. 7. Aortic Atherosclerosis (ICD10-I70.0). Electronically Signed   By: Eddie Candle M.D.   On: 05/29/2019 11:14   US BIOPSY (LIVER)  Result Date: 06/06/2019 INDICATION: 86 year old male with multiple pulmonary nodules and liver lesions concerning for metastatic disease. He presents for ultrasound-guided core biopsy of 1 of the liver lesions. EXAM: ULTRASOUND BIOPSY CORE LIVER MEDICATIONS: None. ANESTHESIA/SEDATION: Moderate (conscious) sedation was employed during this procedure. A total of Versed 0.5 mg and Fentanyl 50 mcg was administered intravenously. Moderate Sedation Time: 10 minutes. The patient's level of consciousness and vital signs were monitored continuously by radiology nursing throughout the procedure under my direct supervision. FLUOROSCOPY TIME:  None. COMPLICATIONS: None immediate. PROCEDURE: Informed written consent was obtained from the patient after a thorough discussion of the procedural risks, benefits and alternatives. All questions were  addressed. A timeout was performed prior to the initiation of the procedure. Ultrasound was used to interrogate the liver. There are numerous hypoechoic solid masses with areas of internal cystic degeneration scattered throughout the liver. A suitable lesion in the posterior right hepatic lobe was identified. A skin entry site was selected and marked. The overlying skin was sterilely prepped and draped in the standard fashion using chlorhexidine skin prep. Local anesthesia was attained by infiltration with 1% lidocaine. A small dermatotomy was made. Under real-time ultrasound guidance, a 17 gauge introducer needle was advanced into the  margin of the mass. Multiple 18 gauge core biopsies were then coaxially obtained using the bio Pince automated biopsy device. Biopsy specimens were placed in formalin and delivered to pathology for further analysis. As the introducer needle was removed, the biopsy tract was embolized with a Gel-Foam slurry. No evidence of postprocedural hemorrhage or complication. The patient tolerated the procedure well. IMPRESSION: Ultrasound-guided core biopsy of liver lesion. Electronically Signed   By: Jacqulynn Cadet M.D.   On: 06/06/2019 10:04     Assessment and plan- Patient is a 86 y.o. male who presents to [facility] for initial meeting in preparation for starting chemotherapy for the treatment of metastatic melanoma.    1. HPI: Mr. Mutch is a 86 year old male with past medical history significant for hyperlipidemia and GERD and superficial skin cancer; squamous cell.  He presented to the ER with symptoms of right sided flank pain which prompted a CT renal stone study which showed a large right lower lobe pulmonary nodule and several lesions on the right lobe of his liver and right kidney.  He underwent a CT-guided biopsy of his liver which was consistent with malignant melanoma.  Plan is for patient to receive single agent Beryle Flock given his age and comorbidities.  He will also have a port placed.  He will begin treatment on 07/02/2019.  Patient will eventually get a PET scan.  2. Chemo Care Clinic/High Risk for ER/Hospitalization during chemotherapy- We discussed the role of the chemo care clinic and identified patient specific risk factors. I discussed that patient was identified as high risk primarily based on: Patient's age and stage of disease  Patient has past medical history positive for: Past Medical History:  Diagnosis Date   Cancer ([place])    skin melanoma    Patient has past surgical history positive for: Past Surgical History:  Procedure Laterality Date   HERNIA REPAIR      MELANOMA EXCISION      Based on our high risk symptom management report; this patient has a high risk of ED utilization.  The percentage below indicates how "at risk "  this patient based on the factors in this table within one year.   General Risk Score: 6  Values used to calculate this score:   Points  Metrics      2        Age: [facility] Admissions: 0      3        ED Visits: 6      0        Has Chronic Obstructive Pulmonary Disease: No      0        Has Diabetes: No      0        Has Congestive Heart Failure: No      1  Has liver disease: Yes      0        Has Depression: No      0        Current PCP: Maryland Pink, MD      0        Has Medicaid: No   3. We discussed that social determinants of health may have significant impacts on health and outcomes for cancer patients.  Today we discussed specific social determinants of performance status, alcohol use, depression, financial needs, food insecurity, housing, interpersonal violence, social connections, stress, tobacco use, and transportation.    After lengthy discussion the following were identified as areas of need: None at this time  Outpatient services: We discussed options including home based and outpatient services, DME and care program. We discusssed that patients who participate in regular physical activity report fewer negative impacts of cancer and treatments and report less fatigue.   Financial Concerns: We discussed that living with cancer can create tremendous financial burden.  We discussed options for assistance. I asked that if assistance is needed in affording medications or paying bills to please let us know so that we can provide assistance. We discussed options for food including social services, [employer] ($50 every 2 weeks) and onsite food pantry.  We will also notify [facility] to see if cancer center can provide additional support.  Referral to Social work: Introduced Research officer, political party Elease Etienne and the services he can provide such as support with [employer], cell phone and gas vouchers.   Support groups: We discussed options for support groups at the cancer center. If interested, please notify nurse navigator to enroll. We discussed options for managing stress including healthy eating, exercise as well as participating in no charge counseling services at the cancer center and support groups.  If these are of interest, patient can notify either myself or primary nursing team.We discussed options for management including medications and referral to quit Smart program  Transportation: We discussed options for transportation including acta, paratransit, bus routes, link transit, taxi/uber/lyft, and cancer center [place].  I have notified primary oncology team who will help assist with arranging Lucianne Lei transportation for appointments when/if needed. We also discussed options for transportation on short notice/acute visits.  Palliative care services: We have palliative care services available in the cancer center to discuss goals of care and advanced care planning.  Please let us know if you have any questions or would like to speak to our palliative nurse practitioner.  Symptom Management Clinic: We discussed our symptom management clinic which is available for acute concerns while receiving treatment such as nausea, vomiting or diarrhea.  We can be reached via telephone at [phone] or through my chart.  We are available for virtual or in person visits on the same day from 830 to 4 PM Monday through Friday. He denies needing specific assistance at this time and He will be followed by Dr. Janese Banks clinical team.  Plan: Discussed symptom management clinic. Discussed palliative care services. Discussed resources that are available here at the cancer center. Discussed medications and new prescriptions to begin treatment such as anti-nausea or steroids.   Disposition: RTC on 07/02/2019 for  labs, MD assessment and cycle 1 of Keytruda  Visit Diagnosis 1. Metastatic melanoma ([place])     Patient expressed understanding and was in agreement with this plan. He also understands that He can call clinic at any time with any questions, concerns, or complaints.   Greater  than 50% was spent in counseling and coordination of care with this patient including but not limited to discussion of the relevant topics above (See A&P) including, but not limited to diagnosis and management of acute and chronic medical conditions.   [facility] [place]  CC: Dr. Janese Banks

## 2019-06-25 NOTE — Progress Notes (Signed)
Pharmacist Chemotherapy Monitoring - Initial Assessment    Anticipated start date: 07/02/19   Regimen:  . Are orders appropriate based on the patient's diagnosis, regimen, and cycle? Yes . Does the plan date match the patient's scheduled date? Yes . Is the sequencing of drugs appropriate? Yes . Are the premedications appropriate for the patient's regimen? Yes . Prior Authorization for treatment is: Pending o If applicable, is the correct biosimilar selected based on the patient's insurance? not applicable  Organ Function and Labs: Marland Kitchen Are dose adjustments needed based on the patient's renal function, hepatic function, or hematologic function? No . Are appropriate labs ordered prior to the start of patient's treatment? Yes . Other organ system assessment, if indicated: N/A . The following baseline labs, if indicated, have been ordered: pembrolizumab: baseline TSH +/- T4  Dose Assessment: . Are the drug doses appropriate? Yes . Are the following correct: o Drug concentrations Yes o IV fluid compatible with drug Yes o Administration routes Yes o Timing of therapy Yes . If applicable, does the patient have documented access for treatment and/or plans for port-a-cath placement? not applicable . If applicable, have lifetime cumulative doses been properly documented and assessed? not applicable Lifetime Dose Tracking  No doses have been documented on this patient for the following tracked chemicals: Doxorubicin, Epirubicin, Idarubicin, Daunorubicin, Mitoxantrone, Bleomycin, Oxaliplatin, Carboplatin, Liposomal Doxorubicin  o   Toxicity Monitoring/Prevention: . The patient has the following take home antiemetics prescribed: N/A . The patient has the following take home medications prescribed: N/A . Medication allergies and previous infusion related reactions, if applicable, have been reviewed and addressed. Yes . The patient's current medication list has been assessed for drug-drug  interactions with their chemotherapy regimen. no significant drug-drug interactions were identified on review.  Order Review: . Are the treatment plan orders signed? Yes . Is the patient scheduled to see a provider prior to their treatment? Yes  I verify that I have reviewed each item in the above checklist and answered each question accordingly.  Vandella Ord K 06/25/2019 2:43 PM

## 2019-06-26 ENCOUNTER — Other Ambulatory Visit
Admission: RE | Admit: 2019-06-26 | Discharge: 2019-06-26 | Disposition: A | Discharge status: Home / Self Care | Payer: Medicare Other | Source: Ambulatory Visit | Attending: Vascular Surgery | Admitting: Vascular Surgery

## 2019-06-26 ENCOUNTER — Other Ambulatory Visit: Payer: Self-pay

## 2019-06-26 DIAGNOSIS — Z20822 Contact with and (suspected) exposure to covid-19: Secondary | ICD-10-CM | POA: Insufficient documentation

## 2019-06-26 DIAGNOSIS — Z01812 Encounter for preprocedural laboratory examination: Secondary | ICD-10-CM | POA: Diagnosis present

## 2019-06-27 ENCOUNTER — Other Ambulatory Visit (INDEPENDENT_AMBULATORY_CARE_PROVIDER_SITE_OTHER): Payer: Self-pay | Admitting: Nurse Practitioner

## 2019-06-27 LAB — SARS CORONAVIRUS 2 (TAT 6-24 HRS): SARS Coronavirus 2: NEGATIVE

## 2019-06-28 ENCOUNTER — Encounter: Payer: Self-pay | Admitting: Vascular Surgery

## 2019-06-28 ENCOUNTER — Other Ambulatory Visit: Payer: Self-pay

## 2019-06-28 ENCOUNTER — Encounter
Admission: RE | Disposition: A | Discharge status: Home / Self Care | Payer: Self-pay | Source: Home / Self Care | Attending: Vascular Surgery

## 2019-06-28 ENCOUNTER — Ambulatory Visit
Admission: RE | Admit: 2019-06-28 | Discharge: 2019-06-28 | Disposition: A | Discharge status: Home / Self Care | Payer: Medicare Other | Attending: Vascular Surgery | Admitting: Vascular Surgery

## 2019-06-28 DIAGNOSIS — C7801 Secondary malignant neoplasm of right lung: Secondary | ICD-10-CM | POA: Insufficient documentation

## 2019-06-28 DIAGNOSIS — C799 Secondary malignant neoplasm of unspecified site: Secondary | ICD-10-CM | POA: Insufficient documentation

## 2019-06-28 DIAGNOSIS — C439 Malignant melanoma of skin, unspecified: Secondary | ICD-10-CM

## 2019-06-28 DIAGNOSIS — C787 Secondary malignant neoplasm of liver and intrahepatic bile duct: Secondary | ICD-10-CM | POA: Insufficient documentation

## 2019-06-28 DIAGNOSIS — N189 Chronic kidney disease, unspecified: Secondary | ICD-10-CM | POA: Insufficient documentation

## 2019-06-28 DIAGNOSIS — E785 Hyperlipidemia, unspecified: Secondary | ICD-10-CM | POA: Insufficient documentation

## 2019-06-28 HISTORY — PX: PORTA CATH INSERTION: CATH118285

## 2019-06-28 SURGERY — PORTA CATH INSERTION
Anesthesia: Moderate Sedation

## 2019-06-28 MED ORDER — MIDAZOLAM HCL 2 MG/ML PO SYRP: 8.0000 mg | ORAL_SOLUTION | Freq: Once | ORAL | Status: DC | PRN

## 2019-06-28 MED ORDER — SODIUM CHLORIDE 0.9 % IV SOLN: INTRAVENOUS | Status: DC

## 2019-06-28 MED ORDER — CHLORHEXIDINE GLUCONATE CLOTH 2 % EX PADS
6.0000 | MEDICATED_PAD | Freq: Every day | CUTANEOUS | Status: DC
  Administered 2019-06-28: 6 via TOPICAL

## 2019-06-28 MED ORDER — FAMOTIDINE 20 MG PO TABS: 40.0000 mg | ORAL_TABLET | Freq: Once | ORAL | Status: DC | PRN

## 2019-06-28 MED ORDER — HYDROMORPHONE HCL 1 MG/ML IJ SOLN: 1.0000 mg | Freq: Once | INTRAMUSCULAR | Status: DC | PRN

## 2019-06-28 MED ORDER — CEFAZOLIN SODIUM-DEXTROSE 2-4 GM/100ML-% IV SOLN: 2.0000 g | Freq: Once | INTRAVENOUS | Status: AC

## 2019-06-28 MED ORDER — MIDAZOLAM HCL 5 MG/5ML IJ SOLN
INTRAMUSCULAR | Status: AC
  Filled 2019-06-28: qty 5

## 2019-06-28 MED ORDER — ONDANSETRON HCL 4 MG/2ML IJ SOLN: 4.0000 mg | Freq: Four times a day (QID) | INTRAMUSCULAR | Status: DC | PRN

## 2019-06-28 MED ORDER — DIPHENHYDRAMINE HCL 50 MG/ML IJ SOLN: 50.0000 mg | Freq: Once | INTRAMUSCULAR | Status: DC | PRN

## 2019-06-28 MED ORDER — SODIUM CHLORIDE 0.9 % IV SOLN
Freq: Once | INTRAVENOUS | Status: DC
  Filled 2019-06-28: qty 2

## 2019-06-28 MED ORDER — MIDAZOLAM HCL 2 MG/2ML IJ SOLN
INTRAMUSCULAR | Status: DC | PRN
  Administered 2019-06-28: 1 mg via INTRAVENOUS

## 2019-06-28 MED ORDER — METHYLPREDNISOLONE SODIUM SUCC 125 MG IJ SOLR: 125.0000 mg | Freq: Once | INTRAMUSCULAR | Status: DC | PRN

## 2019-06-28 MED ORDER — CEFAZOLIN SODIUM-DEXTROSE 2-4 GM/100ML-% IV SOLN
INTRAVENOUS | Status: AC
  Administered 2019-06-28: 2 g via INTRAVENOUS
  Filled 2019-06-28: qty 100

## 2019-06-28 MED ORDER — FENTANYL CITRATE (PF) 100 MCG/2ML IJ SOLN
INTRAMUSCULAR | Status: AC
  Filled 2019-06-28: qty 2

## 2019-06-28 MED ORDER — FENTANYL CITRATE (PF) 100 MCG/2ML IJ SOLN
INTRAMUSCULAR | Status: DC | PRN
  Administered 2019-06-28: 50 ug via INTRAVENOUS

## 2019-06-28 SURGICAL SUPPLY — 10 items
CANNULA 5F STIFF (CANNULA) ×3 IMPLANT
DERMABOND ADVANCED (GAUZE/BANDAGES/DRESSINGS) ×2
DERMABOND ADVANCED .7 DNX12 (GAUZE/BANDAGES/DRESSINGS) ×1 IMPLANT
KIT PORT POWER 8FR ISP CVUE (Port) ×3 IMPLANT
PACK ANGIOGRAPHY (CUSTOM PROCEDURE TRAY) ×3 IMPLANT
PENCIL ELECTRO HAND CTR (MISCELLANEOUS) ×3 IMPLANT
SUT MNCRL AB 4-0 PS2 18 (SUTURE) ×6 IMPLANT
SUT PROLENE 0 CT 1 30 (SUTURE) ×3 IMPLANT
SUT VIC AB 3-0 SH 27 (SUTURE) ×2
SUT VIC AB 3-0 SH 27X BRD (SUTURE) ×1 IMPLANT

## 2019-06-28 NOTE — Op Note (Signed)
      [facility]       Operative Note  Date: 06/28/2019  Preoperative diagnosis:  1. Metastatic melanoma  Postoperative diagnosis:  Same as above  Procedures: #1. Ultrasound guidance for vascular access to the right internal jugular vein. #2. Fluoroscopic guidance for placement of catheter. #3. Placement of CT compatible Port-A-Cath, right internal jugular vein.  Surgeon: Leotis Pain, MD.   Anesthesia: Local with moderate conscious sedation for approximately 15  minutes using 1 mg of Versed and 50 mcg of Fentanyl  Fluoroscopy time: less than 1 minute  Contrast used: 0  Estimated blood loss: 3 cc  Indication for the procedure:  The patient is a 86 y.o.male with melanoma.  The patient needs a Port-A-Cath for durable venous access, chemotherapy, lab draws, and CT scans. We are asked to place this. Risks and benefits were discussed and informed consent was obtained.  Description of procedure: The patient was brought to the vascular and interventional radiology suite.  Moderate conscious sedation was administered throughout the procedure during a face to face encounter with the patient with my supervision of the RN administering medicines and monitoring the patient's vital signs, pulse oximetry, telemetry and mental status throughout from the start of the procedure until the patient was taken to the recovery room. The right neck chest and shoulder were sterilely prepped and draped, and a sterile surgical field was created. Ultrasound was used to help visualize a patent right internal jugular vein. This was then accessed under direct ultrasound guidance without difficulty with the Seldinger needle and a permanent image was recorded. A J-wire was placed. After skin nick and dilatation, the peel-away sheath was then placed over the wire. I then anesthetized an area under the clavicle approximately 1-2 fingerbreadths. A transverse incision was created and an inferior pocket was  created with electrocautery and blunt dissection. The port was then brought onto the field, placed into the pocket and secured to the chest wall with 2 Prolene sutures. The catheter was connected to the port and tunneled from the subclavicular incision to the access site. Fluoroscopic guidance was then used to cut the catheter to an appropriate length. The catheter was then placed through the peel-away sheath and the peel-away sheath was removed. The catheter tip was parked in excellent location under fluorocoscopic guidance in the SVC just above the right atrium. The pocket was then irrigated with antibiotic impregnated saline and the wound was closed with a running 3-0 Vicryl and a 4-0 Monocryl. The access incision was closed with a single 4-0 Monocryl. The Huber needle was used to withdraw blood and flush the port with heparinized saline. Dermabond was then placed as a dressing. The patient tolerated the procedure well and was taken to the recovery room in stable condition.   Leotis Pain 06/28/2019 8:45 AM   This note was created with Dragon Medical transcription system. Any errors in dictation are purely unintentional.

## 2019-06-28 NOTE — H&P (Signed)
[facility] History & Physical Update  The patient was interviewed and re-examined.  The patient's previous History and Physical has been reviewed and is unchanged.  There is no change in the plan of care. We plan to proceed with the scheduled procedure.  Leotis Pain, MD  06/28/2019, 8:04 AM

## 2019-07-02 ENCOUNTER — Inpatient Hospital Stay (HOSPITAL_BASED_OUTPATIENT_CLINIC_OR_DEPARTMENT_OTHER): Payer: Medicare Other | Admitting: Oncology

## 2019-07-02 ENCOUNTER — Encounter: Payer: Self-pay | Admitting: Oncology

## 2019-07-02 ENCOUNTER — Inpatient Hospital Stay: Payer: Medicare Other

## 2019-07-02 ENCOUNTER — Other Ambulatory Visit: Payer: Self-pay

## 2019-07-02 VITALS — HR 85

## 2019-07-02 VITALS — BP 152/66 | HR 104 | Temp 96.9°F | Wt 166.2 lb

## 2019-07-02 DIAGNOSIS — C439 Malignant melanoma of skin, unspecified: Secondary | ICD-10-CM

## 2019-07-02 DIAGNOSIS — C799 Secondary malignant neoplasm of unspecified site: Secondary | ICD-10-CM | POA: Diagnosis not present

## 2019-07-02 DIAGNOSIS — Z95828 Presence of other vascular implants and grafts: Secondary | ICD-10-CM

## 2019-07-02 DIAGNOSIS — Z5112 Encounter for antineoplastic immunotherapy: Secondary | ICD-10-CM

## 2019-07-02 LAB — COMPREHENSIVE METABOLIC PANEL
ALT: 16 U/L (ref 0–44)
AST: 25 U/L (ref 15–41)
Albumin: 3.8 g/dL (ref 3.5–5.0)
Alkaline Phosphatase: 149 U/L — ABNORMAL HIGH (ref 38–126)
Anion gap: 8 (ref 5–15)
BUN: 21 mg/dL (ref 8–23)
CO2: 26 mmol/L (ref 22–32)
Calcium: 8.8 mg/dL — ABNORMAL LOW (ref 8.9–10.3)
Chloride: 104 mmol/L (ref 98–111)
Creatinine, Ser: 1.48 mg/dL — ABNORMAL HIGH (ref 0.61–1.24)
GFR calc Af Amer: 47 mL/min — ABNORMAL LOW (ref >60)
GFR calc non Af Amer: 40 mL/min — ABNORMAL LOW (ref >60)
Glucose, Bld: 100 mg/dL — ABNORMAL HIGH (ref 70–99)
Potassium: 4 mmol/L (ref 3.5–5.1)
Sodium: 138 mmol/L (ref 135–145)
Total Bilirubin: 0.6 mg/dL (ref 0.3–1.2)
Total Protein: 7.1 g/dL (ref 6.5–8.1)

## 2019-07-02 LAB — CBC WITH DIFFERENTIAL/PLATELET
Abs Immature Granulocytes: 0.03 10*3/uL (ref 0.00–0.07)
Basophils Absolute: 0 10*3/uL (ref 0.0–0.1)
Basophils Relative: 0 %
Eosinophils Absolute: 0.1 10*3/uL (ref 0.0–0.5)
Eosinophils Relative: 1 %
HCT: 39.4 % (ref 39.0–52.0)
Hemoglobin: 12.8 g/dL — ABNORMAL LOW (ref 13.0–17.0)
Immature Granulocytes: 0 %
Lymphocytes Relative: 25 %
Lymphs Abs: 2.4 10*3/uL (ref 0.7–4.0)
MCH: 30.7 pg (ref 26.0–34.0)
MCHC: 32.5 g/dL (ref 30.0–36.0)
MCV: 94.5 fL (ref 80.0–100.0)
Monocytes Absolute: 0.8 10*3/uL (ref 0.1–1.0)
Monocytes Relative: 9 %
Neutro Abs: 6.2 10*3/uL (ref 1.7–7.7)
Neutrophils Relative %: 65 %
Platelets: 237 10*3/uL (ref 150–400)
RBC: 4.17 MIL/uL — ABNORMAL LOW (ref 4.22–5.81)
RDW: 12.7 % (ref 11.5–15.5)
WBC: 9.6 10*3/uL (ref 4.0–10.5)
nRBC: 0 % (ref 0.0–0.2)

## 2019-07-02 LAB — TSH: TSH: 1.788 u[IU]/mL (ref 0.350–4.500)

## 2019-07-02 MED ORDER — SODIUM CHLORIDE 0.9 % IV SOLN
200.0000 mg | Freq: Once | INTRAVENOUS | Status: AC
  Administered 2019-07-02: 200 mg via INTRAVENOUS
  Filled 2019-07-02: qty 8

## 2019-07-02 MED ORDER — HEPARIN SOD (PORK) LOCK FLUSH 100 UNIT/ML IV SOLN
500.0000 [IU] | Freq: Once | INTRAVENOUS | Status: AC | PRN
  Administered 2019-07-02: 500 [IU]
  Filled 2019-07-02: qty 5

## 2019-07-02 MED ORDER — SODIUM CHLORIDE 0.9 % IV SOLN
Freq: Once | INTRAVENOUS | Status: AC
  Filled 2019-07-02: qty 250

## 2019-07-02 MED ORDER — HEPARIN SOD (PORK) LOCK FLUSH 100 UNIT/ML IV SOLN
INTRAVENOUS | Status: AC
  Filled 2019-07-02: qty 5

## 2019-07-02 MED ORDER — SODIUM CHLORIDE 0.9% FLUSH
10.0000 mL | INTRAVENOUS | Status: DC | PRN
  Administered 2019-07-02: 10 mL via INTRAVENOUS
  Filled 2019-07-02: qty 10

## 2019-07-03 ENCOUNTER — Telehealth: Payer: Self-pay

## 2019-07-03 LAB — T4: T4, Total: 5.9 ug/dL (ref 4.5–12.0)

## 2019-07-03 NOTE — Telephone Encounter (Signed)
T/C to pt for follow up after receiving first infusion.   No answer but left voice mail stating I was calling to see how first infusion went.  Encouraged pt to call for any questions or concerns.

## 2019-07-05 NOTE — Progress Notes (Signed)
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 He has no significant comorbidities other than hyperlipidemia and GERD. He has a history of superficial skin cancer. He presented to the ER with symptoms of right-sided flank pain and had a CT renal stone study which showed a 2.1 x 1.1 cm right lower lobe pulmonary nodule. Also noted to have multiple new ill-defined lesions throughout the right lobe of the liver largest measuring 3.4 x 2.8 cm. Spleen and pancreas was unremarkable. Low-attenuation lesion in the right kidney 2.6 x 1.2 cm likely representing a cyst. No intra-abdominal adenopathy. Prostate gland is enlarged measuring 6.2 x 6.5 x 5.3 cm.Patient has some baseline CKD and his creatinine is around 1.3.  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  NGS testing is currently pending.  MRI brain was negative for metastases.  Interval history-patient is currently feeling well.  He has mild chronic back pain which is relatively stable.  He has been using Tylenol and occasional tramadol for the same.  Denies any problems with his bowel movements nausea or vomiting.  ECOG PS- 1 Pain scale- 0   Review of systems- Review of Systems  Constitutional: Negative for chills, fever, malaise/fatigue and weight loss.  HENT: Negative for congestion, ear  discharge and nosebleeds.   Eyes: Negative for blurred vision.  Respiratory: Negative for cough, hemoptysis, sputum production, shortness of breath and wheezing.   Cardiovascular: Negative for chest pain, palpitations, orthopnea and claudication.  Gastrointestinal: Negative for abdominal pain, blood in stool, constipation, diarrhea, heartburn, melena, nausea and vomiting.  Genitourinary: Negative for dysuria, flank pain, frequency, hematuria and urgency.  Musculoskeletal: Negative for back pain, joint pain and myalgias.  Skin: Negative for rash.  Neurological: Negative for dizziness, tingling, focal weakness, seizures, weakness and headaches.  Endo/Heme/Allergies: Does not bruise/bleed easily.  Psychiatric/Behavioral: Negative for depression and suicidal ideas. The patient does not have insomnia.       No Known Allergies   Past Medical History:  Diagnosis Date  . Cancer ([place])    skin melanoma     Past Surgical History:  Procedure Laterality Date  . CATARACT EXTRACTION Bilateral 2000  . HERNIA REPAIR    . MELANOMA EXCISION    . PORTA CATH INSERTION N/A 06/28/2019   Procedure: PORTA CATH INSERTION;  Surgeon: Algernon Huxley, MD;  Location: [facility];  Service: Cardiovascular;  Laterality: N/A;    Social History   Socioeconomic History  . Marital status: Widowed    Spouse name: Not on file  . Number of children: Not on file  . Years of education: Not on file  . Highest education level: Not on file  Occupational History  . Not on file  Tobacco Use  . Smoking status: Never Smoker  . Smokeless tobacco: Never Used  Substance and Sexual Activity  . Alcohol use: Never  . Drug use: Not on file  .  Sexual activity: Not on file  Other Topics Concern  . Not on file  Social History Narrative   Lives at home by himself    Social Determinants of Health   Financial Resource Strain:   . Difficulty of Paying Living Expenses:   Food Insecurity:   . Worried About Paediatric nurse in the Last Year:   . Arboriculturist in the Last Year:   Transportation Needs:   . Film/video editor (Medical):   Marland Kitchen Lack of Transportation (Non-Medical):   Physical Activity:   . Days of Exercise per Week:   . Minutes of Exercise per Session:   Stress:   . Feeling of Stress :   Social Connections:   . Frequency of Communication with Friends and Family:   . Frequency of Social Gatherings with Friends and Family:   . Attends Religious Services:   . Active Member of Clubs or Organizations:   . Attends Archivist Meetings:   Marland Kitchen Marital Status:   Intimate Partner Violence:   . Fear of Current or Ex-Partner:   . Emotionally Abused:   Marland Kitchen Physically Abused:   . Sexually Abused:     No family history on file.   Current Outpatient Medications:  .  acetaminophen (TYLENOL) 325 MG tablet, Take 650 mg by mouth every 6 (six) hours as needed., Disp: , Rfl:  .  lidocaine-prilocaine (EMLA) cream, Apply to affected area once, Disp: 30 g, Rfl: 3 .  traMADol (ULTRAM) 50 MG tablet, TAKE 1 TABLET (50 MG TOTAL) BY MOUTH EVERY 6 (SIX) HOURS AS NEEDED., Disp: 60 tablet, Rfl: 0 .  Loratadine 10 MG CAPS, Take by mouth. (Patient not taking: Reported on 06/28/2019), Disp: , Rfl:  .  ondansetron (ZOFRAN) 8 MG tablet, Take 1 tablet (8 mg total) by mouth 2 (two) times daily as needed (Nausea or vomiting). (Patient not taking: Reported on 06/28/2019), Disp: 30 tablet, Rfl: 1 .  prochlorperazine (COMPAZINE) 10 MG tablet, Take 1 tablet (10 mg total) by mouth every 6 (six) hours as needed (Nausea or vomiting). (Patient not taking: Reported on 06/28/2019), Disp: 30 tablet, Rfl: 1  Physical exam:  Vitals:   07/02/19 0941  BP: (!) 152/66  Pulse: (!) 104  Temp: (!) 96.9 F (36.1 C)  TempSrc: Tympanic  Weight: 166 lb 4 oz (75.4 kg)   Physical Exam Constitutional:      General: He is not in acute distress. Cardiovascular:     Rate and Rhythm: Normal rate and regular rhythm.     Heart  sounds: Normal heart sounds.  Pulmonary:     Effort: Pulmonary effort is normal.     Breath sounds: Normal breath sounds.  Abdominal:     General: Bowel sounds are normal.     Palpations: Abdomen is soft.  Musculoskeletal:     Cervical back: Tenderness:    Skin:    General: Skin is warm and dry.  Neurological:     Mental Status: He is alert and oriented to person, place, and time.      CMP Latest Ref Rng & Units 07/02/2019  Glucose 70 - 99 mg/dL 100(H)  BUN 8 - 23 mg/dL 21  Creatinine 0.61 - 1.24 mg/dL 1.48(H)  Sodium 135 - 145 mmol/L 138  Potassium 3.5 - 5.1 mmol/L 4.0  Chloride 98 - 111 mmol/L 104  CO2 22 - 32 mmol/L 26  Calcium 8.9 - 10.3 mg/dL 8.8(L)  Total Protein 6.5 - 8.1 g/dL 7.1  Total Bilirubin 0.3 - 1.2 mg/dL 0.6  Alkaline Phos 38 - 126 U/L 149(H)  AST 15 - 41 U/L 25  ALT 0 - 44 U/L 16   CBC Latest Ref Rng & Units 07/02/2019  WBC 4.0 - 10.5 K/uL 9.6  Hemoglobin 13.0 - 17.0 g/dL 12.8(L)  Hematocrit 39 - 52 % 39.4  Platelets 150 - 400 K/uL 237    No images are attached to the encounter.  MR Brain W Wo Contrast  Result Date: 06/18/2019 CLINICAL DATA:  Metastatic melanoma EXAM: MRI HEAD WITHOUT AND WITH CONTRAST TECHNIQUE: Multiplanar, multiecho pulse sequences of the brain and surrounding structures were obtained without and with intravenous contrast. CONTRAST:  7.54m GADAVIST GADOBUTROL 1 MMOL/ML IV SOLN COMPARISON:  None. FINDINGS: Brain: There is no acute infarction or intracranial hemorrhage. There is no intracranial mass, mass effect, or edema. There is no hydrocephalus or extra-axial fluid collection. 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 Vascular: Major vessel flow voids at the skull base are preserved. Skull and upper cervical spine: Normal marrow signal is preserved. Sinuses/Orbits: Mild mucosal thickening.  Orbits are unremarkable. Other: Sella  is unremarkable.  Mastoid air cells are clear. IMPRESSION: No evidence of intracranial metastatic disease. 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 Electronically Signed   By: PMacy MisM.D.   On: 06/18/2019 09:41   PERIPHERAL VASCULAR CATHETERIZATION  Result Date: 06/28/2019 See op note  [place]OPSY (LIVER)  Result Date: 06/06/2019 INDICATION: 86year old male with multiple pulmonary nodules and liver lesions concerning for metastatic disease. He presents for ultrasound-guided core biopsy of 1 of the liver lesions. EXAM: ULTRASOUND BIOPSY CORE LIVER MEDICATIONS: None. ANESTHESIA/SEDATION: Moderate (conscious) sedation was employed during this procedure. A total of Versed 0.5 mg and Fentanyl 50 mcg was administered intravenously. Moderate Sedation Time: 10 minutes. The patient's level of consciousness and vital signs were monitored continuously by radiology nursing throughout the procedure under my direct supervision. FLUOROSCOPY TIME:  None. COMPLICATIONS: None immediate. PROCEDURE: Informed written consent was obtained from the patient after a thorough discussion of the procedural risks, benefits and alternatives. All questions were addressed. A timeout was performed prior to the initiation of the procedure. Ultrasound was used to interrogate the liver. There are numerous hypoechoic solid masses with areas of internal cystic degeneration scattered throughout the liver. A suitable lesion in the posterior right hepatic lobe was identified. A skin entry site was selected and marked. The overlying skin was sterilely prepped and draped in the standard fashion using chlorhexidine skin prep. Local anesthesia was attained by infiltration with 1% lidocaine. A small dermatotomy was made. Under real-time ultrasound guidance, a 17 gauge introducer needle was advanced into the margin of the mass. Multiple 18 gauge core biopsies were then coaxially obtained  using the bio Pince automated biopsy device. Biopsy specimens were placed in formalin and delivered to pathology for further analysis. As the introducer needle was removed, the biopsy tract was embolized with a Gel-Foam slurry. No evidence of postprocedural hemorrhage or complication. The patient tolerated the procedure well. IMPRESSION: Ultrasound-guided core biopsy of liver lesion. Electronically Signed   By: HJacqulynn CadetM.D.   On: 06/06/2019 10:04     Assessment and plan- Patient is a 86y.o. 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  Counts  are otherwise okay to proceed with cycle 1 of palliative Keytruda.  He does have baseline CKD which we will continue to monitor.  Plan is to continue Keytruda until progression or toxicity.  I will see him back in 3 weeks for cycle 2 with CBC with differential CMP and LDH.  We have ordered NGS testing on his tumor specimen to look for BRAF and other mutations.   Visit Diagnosis 1. Encounter for antineoplastic immunotherapy   2. Metastatic melanoma ([place])      Dr. Randa Evens, MD, MPH [facility] [phone] 07/05/2019 8:28 AM

## 2019-07-09 ENCOUNTER — Encounter: Payer: Self-pay | Admitting: Oncology

## 2019-07-23 ENCOUNTER — Inpatient Hospital Stay: Payer: Medicare Other

## 2019-07-23 ENCOUNTER — Other Ambulatory Visit: Payer: Self-pay

## 2019-07-23 ENCOUNTER — Inpatient Hospital Stay: Payer: Medicare Other | Attending: Oncology

## 2019-07-23 ENCOUNTER — Inpatient Hospital Stay (HOSPITAL_BASED_OUTPATIENT_CLINIC_OR_DEPARTMENT_OTHER): Payer: Medicare Other | Admitting: Oncology

## 2019-07-23 VITALS — BP 134/48 | HR 60 | Temp 95.6°F | Resp 16 | Wt 165.3 lb

## 2019-07-23 DIAGNOSIS — C787 Secondary malignant neoplasm of liver and intrahepatic bile duct: Secondary | ICD-10-CM | POA: Insufficient documentation

## 2019-07-23 DIAGNOSIS — Z79899 Other long term (current) drug therapy: Secondary | ICD-10-CM | POA: Diagnosis not present

## 2019-07-23 DIAGNOSIS — N400 Enlarged prostate without lower urinary tract symptoms: Secondary | ICD-10-CM | POA: Diagnosis not present

## 2019-07-23 DIAGNOSIS — C439 Malignant melanoma of skin, unspecified: Secondary | ICD-10-CM

## 2019-07-23 DIAGNOSIS — Z5112 Encounter for antineoplastic immunotherapy: Secondary | ICD-10-CM

## 2019-07-23 DIAGNOSIS — K219 Gastro-esophageal reflux disease without esophagitis: Secondary | ICD-10-CM | POA: Diagnosis not present

## 2019-07-23 DIAGNOSIS — N189 Chronic kidney disease, unspecified: Secondary | ICD-10-CM | POA: Insufficient documentation

## 2019-07-23 DIAGNOSIS — C799 Secondary malignant neoplasm of unspecified site: Secondary | ICD-10-CM

## 2019-07-23 DIAGNOSIS — E785 Hyperlipidemia, unspecified: Secondary | ICD-10-CM | POA: Insufficient documentation

## 2019-07-23 DIAGNOSIS — C7800 Secondary malignant neoplasm of unspecified lung: Secondary | ICD-10-CM | POA: Diagnosis not present

## 2019-07-23 LAB — COMPREHENSIVE METABOLIC PANEL
ALT: 15 U/L (ref 0–44)
AST: 23 U/L (ref 15–41)
Albumin: 3.7 g/dL (ref 3.5–5.0)
Alkaline Phosphatase: 135 U/L — ABNORMAL HIGH (ref 38–126)
Anion gap: 8 (ref 5–15)
BUN: 23 mg/dL (ref 8–23)
CO2: 25 mmol/L (ref 22–32)
Calcium: 8.7 mg/dL — ABNORMAL LOW (ref 8.9–10.3)
Chloride: 106 mmol/L (ref 98–111)
Creatinine, Ser: 1.41 mg/dL — ABNORMAL HIGH (ref 0.61–1.24)
GFR calc Af Amer: 49 mL/min — ABNORMAL LOW (ref >60)
GFR calc non Af Amer: 43 mL/min — ABNORMAL LOW (ref >60)
Glucose, Bld: 114 mg/dL — ABNORMAL HIGH (ref 70–99)
Potassium: 4.2 mmol/L (ref 3.5–5.1)
Sodium: 139 mmol/L (ref 135–145)
Total Bilirubin: 0.5 mg/dL (ref 0.3–1.2)
Total Protein: 6.9 g/dL (ref 6.5–8.1)

## 2019-07-23 LAB — CBC WITH DIFFERENTIAL/PLATELET
Abs Immature Granulocytes: 0 10*3/uL (ref 0.00–0.07)
Basophils Absolute: 0 10*3/uL (ref 0.0–0.1)
Basophils Relative: 0 %
Eosinophils Absolute: 0.4 10*3/uL (ref 0.0–0.5)
Eosinophils Relative: 5 %
HCT: 38.7 % — ABNORMAL LOW (ref 39.0–52.0)
Hemoglobin: 12.7 g/dL — ABNORMAL LOW (ref 13.0–17.0)
Immature Granulocytes: 0 %
Lymphocytes Relative: 32 %
Lymphs Abs: 2.3 10*3/uL (ref 0.7–4.0)
MCH: 30.9 pg (ref 26.0–34.0)
MCHC: 32.8 g/dL (ref 30.0–36.0)
MCV: 94.2 fL (ref 80.0–100.0)
Monocytes Absolute: 0.5 10*3/uL (ref 0.1–1.0)
Monocytes Relative: 8 %
Neutro Abs: 3.9 10*3/uL (ref 1.7–7.7)
Neutrophils Relative %: 55 %
Platelets: 190 10*3/uL (ref 150–400)
RBC: 4.11 MIL/uL — ABNORMAL LOW (ref 4.22–5.81)
RDW: 13.3 % (ref 11.5–15.5)
WBC: 7.1 10*3/uL (ref 4.0–10.5)
nRBC: 0 % (ref 0.0–0.2)

## 2019-07-23 LAB — TSH: TSH: 1.555 u[IU]/mL (ref 0.350–4.500)

## 2019-07-23 LAB — LACTATE DEHYDROGENASE: LDH: 132 U/L (ref 98–192)

## 2019-07-23 MED ORDER — SODIUM CHLORIDE 0.9 % IV SOLN
200.0000 mg | Freq: Once | INTRAVENOUS | Status: AC
  Administered 2019-07-23: 200 mg via INTRAVENOUS
  Filled 2019-07-23: qty 8

## 2019-07-23 MED ORDER — HEPARIN SOD (PORK) LOCK FLUSH 100 UNIT/ML IV SOLN
INTRAVENOUS | Status: AC
  Filled 2019-07-23: qty 5

## 2019-07-23 MED ORDER — SODIUM CHLORIDE 0.9% FLUSH
10.0000 mL | Freq: Once | INTRAVENOUS | Status: DC
  Filled 2019-07-23: qty 10

## 2019-07-23 MED ORDER — SODIUM CHLORIDE 0.9 % IV SOLN
Freq: Once | INTRAVENOUS | Status: AC
  Filled 2019-07-23: qty 250

## 2019-07-23 MED ORDER — HEPARIN SOD (PORK) LOCK FLUSH 100 UNIT/ML IV SOLN
500.0000 [IU] | Freq: Once | INTRAVENOUS | Status: AC
  Administered 2019-07-23: 500 [IU] via INTRAVENOUS
  Filled 2019-07-23: qty 5

## 2019-07-23 MED ORDER — HEPARIN SOD (PORK) LOCK FLUSH 100 UNIT/ML IV SOLN
500.0000 [IU] | Freq: Once | INTRAVENOUS | Status: DC | PRN
  Filled 2019-07-23: qty 5

## 2019-07-23 NOTE — Progress Notes (Signed)
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 He has no significant comorbidities other than hyperlipidemia and GERD. He has a history of superficial skin cancer. He presented to the ER with symptoms of right-sided flank pain and had a CT renal stone study which showed a 2.1 x 1.1 cm right lower lobe pulmonary nodule. Also noted to have multiple new ill-defined lesions throughout the right lobe of the liver largest measuring 3.4 x 2.8 cm. Spleen and pancreas was unremarkable. Low-attenuation lesion in the right kidney 2.6 x 1.2 cm likely representing a cyst. No intra-abdominal adenopathy. 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  NGS testing is currently pending.  MRI brain was negative for metastases.  Interval history-patient is tolerating treatment well so far.  Reports no diarrhea or shortness of breath.  Appetite and weight have remained stable  ECOG PS- 1 Pain scale- 0   Review of systems- Review of Systems  Constitutional: Negative for chills, fever, malaise/fatigue and weight loss.  HENT: Negative for congestion, ear discharge and nosebleeds.   Eyes: Negative for blurred vision.  Respiratory: Negative for cough,  hemoptysis, sputum production, shortness of breath and wheezing.   Cardiovascular: Negative for chest pain, palpitations, orthopnea and claudication.  Gastrointestinal: Negative for abdominal pain, blood in stool, constipation, diarrhea, heartburn, melena, nausea and vomiting.  Genitourinary: Negative for dysuria, flank pain, frequency, hematuria and urgency.  Musculoskeletal: Negative for back pain, joint pain and myalgias.  Skin: Negative for rash.  Neurological: Negative for dizziness, tingling, focal weakness, seizures, weakness and headaches.  Endo/Heme/Allergies: Does not bruise/bleed easily.  Psychiatric/Behavioral: Negative for depression and suicidal ideas. The patient does not have insomnia.        No Known Allergies   Past Medical History:  Diagnosis Date  . Cancer ([place])    skin melanoma     Past Surgical History:  Procedure Laterality Date  . CATARACT EXTRACTION Bilateral 2000  . HERNIA REPAIR    . MELANOMA EXCISION    . PORTA CATH INSERTION N/A 06/28/2019   Procedure: PORTA CATH INSERTION;  Surgeon: Algernon Huxley, MD;  Location: [place] [facility];  Service: Cardiovascular;  Laterality: N/A;    Social History   Socioeconomic History  . Marital status: Widowed    Spouse name: Not on file  . Number of children: Not on file  . Years of education: Not on file  . Highest education level: Not on file  Occupational History  . Not on file  Tobacco Use  . Smoking status: Never Smoker  . Smokeless tobacco: Never Used  Substance and Sexual Activity  . Alcohol use: Never  . Drug use: Not on file  . Sexual activity: Not on file  Other Topics Concern  . Not on file  Social History Narrative  Lives at home by himself    Social Determinants of Health   Financial Resource Strain:   . Difficulty of Paying Living Expenses:   Food Insecurity:   . Worried About Charity fundraiser in the Last Year:   . Arboriculturist in the Last Year:   Transportation Needs:     . Film/video editor (Medical):   Marland Kitchen Lack of Transportation (Non-Medical):   Physical Activity:   . Days of Exercise per Week:   . Minutes of Exercise per Session:   Stress:   . Feeling of Stress :   Social Connections:   . Frequency of Communication with Friends and Family:   . Frequency of Social Gatherings with Friends and Family:   . Attends Religious Services:   . Active Member of Clubs or Organizations:   . Attends Archivist Meetings:   Marland Kitchen Marital Status:   Intimate Partner Violence:   . Fear of Current or Ex-Partner:   . Emotionally Abused:   Marland Kitchen Physically Abused:   . Sexually Abused:     No family history on file.   Current Outpatient Medications:  .  acetaminophen (TYLENOL) 325 MG tablet, Take 650 mg by mouth every 6 (six) hours as needed., Disp: , Rfl:  .  lidocaine-prilocaine (EMLA) cream, Apply to affected area once, Disp: 30 g, Rfl: 3 .  Loratadine 10 MG CAPS, Take by mouth. (Patient not taking: Reported on 06/28/2019), Disp: , Rfl:  .  ondansetron (ZOFRAN) 8 MG tablet, Take 1 tablet (8 mg total) by mouth 2 (two) times daily as needed (Nausea or vomiting). (Patient not taking: Reported on 06/28/2019), Disp: 30 tablet, Rfl: 1 .  prochlorperazine (COMPAZINE) 10 MG tablet, Take 1 tablet (10 mg total) by mouth every 6 (six) hours as needed (Nausea or vomiting). (Patient not taking: Reported on 06/28/2019), Disp: 30 tablet, Rfl: 1 .  traMADol (ULTRAM) 50 MG tablet, TAKE 1 TABLET (50 MG TOTAL) BY MOUTH EVERY 6 (SIX) HOURS AS NEEDED., Disp: 60 tablet, Rfl: 0  Physical exam: There were no vitals filed for this visit. Physical Exam Cardiovascular:     Rate and Rhythm: Normal rate and regular rhythm.     Heart sounds: Normal heart sounds.  Pulmonary:     Effort: Pulmonary effort is normal.     Breath sounds: Normal breath sounds.  Abdominal:     General: Bowel sounds are normal.     Palpations: Abdomen is soft.  Skin:    General: Skin is warm and dry.   Neurological:     Mental Status: He is alert and oriented to person, place, and time.      CMP Latest Ref Rng & Units 07/02/2019  Glucose 70 - 99 mg/dL 100(H)  BUN 8 - 23 mg/dL 21  Creatinine 0.61 - 1.24 mg/dL 1.48(H)  Sodium 135 - 145 mmol/L 138  Potassium 3.5 - 5.1 mmol/L 4.0  Chloride 98 - 111 mmol/L 104  CO2 22 - 32 mmol/L 26  Calcium 8.9 - 10.3 mg/dL 8.8(L)  Total Protein 6.5 - 8.1 g/dL 7.1  Total Bilirubin 0.3 - 1.2 mg/dL 0.6  Alkaline Phos 38 - 126 U/L 149(H)  AST 15 - 41 U/L 25  ALT 0 - 44 U/L 16   CBC Latest Ref Rng & Units 07/02/2019  WBC 4.0 - 10.5 K/uL 9.6  Hemoglobin 13.0 - 17.0 g/dL 12.8(L)  Hematocrit 39 - 52 % 39.4  Platelets 150 - 400 K/uL 237  No images are attached to the encounter.  PERIPHERAL VASCULAR CATHETERIZATION  Result Date: 06/28/2019 See op note    Assessment and plan- Patient is a 86 y.o. male  with stage IV metastatic malignant melanoma with liver and lung metastases.  He is here for on treatment assessment prior to cycle 2 of palliative Keytruda  Counts okay to proceed with cycle 2 of palliative Keytruda today.  He will be seen by covering NP in 3 weeks for cycle 3 and I will see him back in 6 weeks for cycle 4.  Plan to repeat scans after 4 cycles.  Patient is tolerating treatment well without any significant side effects at this time  CKD: He has baseline CKD but his creatinine remained stable around 1.4.  Continue to monitor   Visit Diagnosis 1. Encounter for antineoplastic immunotherapy   2. Metastatic melanoma ([place])      Dr. Randa Evens, MD, MPH [facility] at [facility] [phone] 07/23/2019 8:41 AM

## 2019-07-24 LAB — T4: T4, Total: 5.9 ug/dL (ref 4.5–12.0)

## 2019-08-13 ENCOUNTER — Encounter: Payer: Self-pay | Admitting: Oncology

## 2019-08-13 ENCOUNTER — Inpatient Hospital Stay: Payer: Medicare Other | Attending: Nurse Practitioner

## 2019-08-13 ENCOUNTER — Other Ambulatory Visit: Payer: Self-pay

## 2019-08-13 ENCOUNTER — Other Ambulatory Visit: Payer: Self-pay | Admitting: Oncology

## 2019-08-13 ENCOUNTER — Inpatient Hospital Stay (HOSPITAL_BASED_OUTPATIENT_CLINIC_OR_DEPARTMENT_OTHER): Payer: Medicare Other | Admitting: Oncology

## 2019-08-13 ENCOUNTER — Inpatient Hospital Stay: Payer: Medicare Other

## 2019-08-13 VITALS — BP 146/62 | HR 65 | Temp 98.2°F | Resp 16 | Ht 69.0 in | Wt 165.0 lb

## 2019-08-13 DIAGNOSIS — Z79899 Other long term (current) drug therapy: Secondary | ICD-10-CM | POA: Diagnosis not present

## 2019-08-13 DIAGNOSIS — Z5112 Encounter for antineoplastic immunotherapy: Secondary | ICD-10-CM | POA: Diagnosis not present

## 2019-08-13 DIAGNOSIS — C7800 Secondary malignant neoplasm of unspecified lung: Secondary | ICD-10-CM | POA: Insufficient documentation

## 2019-08-13 DIAGNOSIS — C439 Malignant melanoma of skin, unspecified: Secondary | ICD-10-CM

## 2019-08-13 DIAGNOSIS — R5383 Other fatigue: Secondary | ICD-10-CM | POA: Insufficient documentation

## 2019-08-13 DIAGNOSIS — N183 Chronic kidney disease, stage 3 unspecified: Secondary | ICD-10-CM | POA: Insufficient documentation

## 2019-08-13 DIAGNOSIS — C787 Secondary malignant neoplasm of liver and intrahepatic bile duct: Secondary | ICD-10-CM | POA: Insufficient documentation

## 2019-08-13 DIAGNOSIS — R531 Weakness: Secondary | ICD-10-CM | POA: Diagnosis not present

## 2019-08-13 DIAGNOSIS — N189 Chronic kidney disease, unspecified: Secondary | ICD-10-CM | POA: Diagnosis not present

## 2019-08-13 DIAGNOSIS — C799 Secondary malignant neoplasm of unspecified site: Secondary | ICD-10-CM

## 2019-08-13 DIAGNOSIS — E785 Hyperlipidemia, unspecified: Secondary | ICD-10-CM | POA: Insufficient documentation

## 2019-08-13 DIAGNOSIS — K219 Gastro-esophageal reflux disease without esophagitis: Secondary | ICD-10-CM | POA: Diagnosis not present

## 2019-08-13 DIAGNOSIS — N400 Enlarged prostate without lower urinary tract symptoms: Secondary | ICD-10-CM | POA: Insufficient documentation

## 2019-08-13 DIAGNOSIS — Z85828 Personal history of other malignant neoplasm of skin: Secondary | ICD-10-CM | POA: Insufficient documentation

## 2019-08-13 DIAGNOSIS — I129 Hypertensive chronic kidney disease with stage 1 through stage 4 chronic kidney disease, or unspecified chronic kidney disease: Secondary | ICD-10-CM | POA: Insufficient documentation

## 2019-08-13 LAB — CBC WITH DIFFERENTIAL/PLATELET
Abs Immature Granulocytes: 0.03 10*3/uL (ref 0.00–0.07)
Basophils Absolute: 0 10*3/uL (ref 0.0–0.1)
Basophils Relative: 0 %
Eosinophils Absolute: 0.3 10*3/uL (ref 0.0–0.5)
Eosinophils Relative: 3 %
HCT: 34.5 % — ABNORMAL LOW (ref 39.0–52.0)
Hemoglobin: 11.3 g/dL — ABNORMAL LOW (ref 13.0–17.0)
Immature Granulocytes: 0 %
Lymphocytes Relative: 33 %
Lymphs Abs: 2.7 10*3/uL (ref 0.7–4.0)
MCH: 31.2 pg (ref 26.0–34.0)
MCHC: 32.8 g/dL (ref 30.0–36.0)
MCV: 95.3 fL (ref 80.0–100.0)
Monocytes Absolute: 0.6 10*3/uL (ref 0.1–1.0)
Monocytes Relative: 7 %
Neutro Abs: 4.7 10*3/uL (ref 1.7–7.7)
Neutrophils Relative %: 57 %
Platelets: 221 10*3/uL (ref 150–400)
RBC: 3.62 MIL/uL — ABNORMAL LOW (ref 4.22–5.81)
RDW: 13.3 % (ref 11.5–15.5)
WBC: 8.3 10*3/uL (ref 4.0–10.5)
nRBC: 0 % (ref 0.0–0.2)

## 2019-08-13 LAB — COMPREHENSIVE METABOLIC PANEL
ALT: 18 U/L (ref 0–44)
AST: 22 U/L (ref 15–41)
Albumin: 3.8 g/dL (ref 3.5–5.0)
Alkaline Phosphatase: 114 U/L (ref 38–126)
Anion gap: 7 (ref 5–15)
BUN: 21 mg/dL (ref 8–23)
CO2: 25 mmol/L (ref 22–32)
Calcium: 8.6 mg/dL — ABNORMAL LOW (ref 8.9–10.3)
Chloride: 107 mmol/L (ref 98–111)
Creatinine, Ser: 1.38 mg/dL — ABNORMAL HIGH (ref 0.61–1.24)
GFR calc Af Amer: 51 mL/min — ABNORMAL LOW (ref >60)
GFR calc non Af Amer: 44 mL/min — ABNORMAL LOW (ref >60)
Glucose, Bld: 87 mg/dL (ref 70–99)
Potassium: 4.3 mmol/L (ref 3.5–5.1)
Sodium: 139 mmol/L (ref 135–145)
Total Bilirubin: 0.6 mg/dL (ref 0.3–1.2)
Total Protein: 6.6 g/dL (ref 6.5–8.1)

## 2019-08-13 LAB — TSH: TSH: 2.259 u[IU]/mL (ref 0.350–4.500)

## 2019-08-13 MED ORDER — SODIUM CHLORIDE 0.9 % IV SOLN
Freq: Once | INTRAVENOUS | Status: AC
  Filled 2019-08-13: qty 250

## 2019-08-13 MED ORDER — SODIUM CHLORIDE 0.9% FLUSH
10.0000 mL | INTRAVENOUS | Status: DC | PRN
  Administered 2019-08-13: 10 mL via INTRAVENOUS
  Filled 2019-08-13: qty 10

## 2019-08-13 MED ORDER — HEPARIN SOD (PORK) LOCK FLUSH 100 UNIT/ML IV SOLN
500.0000 [IU] | Freq: Once | INTRAVENOUS | Status: AC
  Administered 2019-08-13: 500 [IU] via INTRAVENOUS
  Filled 2019-08-13: qty 5

## 2019-08-13 MED ORDER — SODIUM CHLORIDE 0.9 % IV SOLN
200.0000 mg | Freq: Once | INTRAVENOUS | Status: AC
  Administered 2019-08-13: 200 mg via INTRAVENOUS
  Filled 2019-08-13: qty 8

## 2019-08-13 NOTE — Progress Notes (Signed)
Hematology/Oncology Consult note [facility]  [phone] [phone]  Patient Care Team: Maryland Pink, MD as PCP - General (Family Medicine)   Name of the patient: Omar Schroeder  [id number]  08-01-1924   Date of visit: 08/13/19  Diagnosis- stage IV metastatic malignant melanoma with lung and liver metastases  Chief complaint/ Reason for visit-on treatment assessment prior to cycle 3 of palliative Keytruda  Heme/Onc history: Patient is an [age] year old male who lives alone and is independent of his ADLs. He has no significant comorbidities other than hyperlipidemia and GERD. He has a history of superficial skin cancer. He presented to the ER with symptoms of right-sided flank pain and had a CT renal stone study which showed a 2.1 x 1.1 cm right lower lobe pulmonary nodule. Also noted to have multiple new ill-defined lesions throughout the right lobe of the liver largest measuring 3.4 x 2.8 cm. Spleen and pancreas was unremarkable. Low-attenuation lesion in the right kidney 2.6 x 1.2 cm likely representing a cyst. No intra-abdominal adenopathy. 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  NGS testing is currently pending.  MRI brain was negative for metastases.  Interval history-  patient is tolerating treatment well so far.  Reports no diarrhea or shortness of breath.  Appetite and weight have remained stable  ECOG PS- 1 Pain scale- 0   Review of systems- Review of Systems  Constitutional: Positive for malaise/fatigue. Negative for chills, fever and weight loss.  HENT: Negative for congestion, ear discharge and nosebleeds.   Eyes: Negative for blurred vision.  Respiratory:  Negative for cough, hemoptysis, sputum production, shortness of breath and wheezing.   Cardiovascular: Negative for chest pain, palpitations, orthopnea and claudication.  Gastrointestinal: Negative for abdominal pain, blood in stool, constipation, diarrhea, heartburn, melena, nausea and vomiting.  Genitourinary: Negative for dysuria, flank pain, frequency, hematuria and urgency.  Musculoskeletal: Positive for joint pain. Negative for back pain and myalgias.  Skin: Negative for rash.  Neurological: Positive for weakness. Negative for dizziness, tingling, focal weakness, seizures and headaches.  Endo/Heme/Allergies: Does not bruise/bleed easily.  Psychiatric/Behavioral: Negative for depression and suicidal ideas. The patient does not have insomnia.        No Known Allergies   Past Medical History:  Diagnosis Date  . Cancer ([place])    skin melanoma     Past Surgical History:  Procedure Laterality Date  . CATARACT EXTRACTION Bilateral 2000  . HERNIA REPAIR    . MELANOMA EXCISION    . PORTA CATH INSERTION N/A 06/28/2019   Procedure: PORTA CATH INSERTION;  Surgeon: Algernon Huxley, MD;  Location: [place] [facility];  Service: Cardiovascular;  Laterality: N/A;    Social History   Socioeconomic History  . Marital status: Widowed    Spouse name: Not on file  . Number of children: Not on file  . Years of education: Not on file  . Highest education level: Not on file  Occupational History  . Not on file  Tobacco Use  . Smoking status: Never Smoker  . Smokeless tobacco: Never Used  Vaping Use  . Vaping Use: Never used  Substance and Sexual Activity  . Alcohol use: Never  . Drug use: Not Currently  . Sexual activity:  Not Currently  Other Topics Concern  . Not on file  Social History Narrative   Lives at home by himself    Social Determinants of Health   Financial Resource Strain:   . Difficulty of Paying Living Expenses:   Food Insecurity:   . Worried About Sales executive in the Last Year:   . Arboriculturist in the Last Year:   Transportation Needs:   . Film/video editor (Medical):   Marland Kitchen Lack of Transportation (Non-Medical):   Physical Activity:   . Days of Exercise per Week:   . Minutes of Exercise per Session:   Stress:   . Feeling of Stress :   Social Connections:   . Frequency of Communication with Friends and Family:   . Frequency of Social Gatherings with Friends and Family:   . Attends Religious Services:   . Active Member of Clubs or Organizations:   . Attends Archivist Meetings:   Marland Kitchen Marital Status:   Intimate Partner Violence:   . Fear of Current or Ex-Partner:   . Emotionally Abused:   Marland Kitchen Physically Abused:   . Sexually Abused:     History reviewed. No pertinent family history.   Current Outpatient Medications:  .  traMADol (ULTRAM) 50 MG tablet, TAKE 1 TABLET (50 MG TOTAL) BY MOUTH EVERY 6 (SIX) HOURS AS NEEDED., Disp: 60 tablet, Rfl: 0 .  acetaminophen (TYLENOL) 325 MG tablet, Take 650 mg by mouth every 6 (six) hours as needed. (Patient not taking: Reported on 08/13/2019), Disp: , Rfl:  .  lidocaine-prilocaine (EMLA) cream, Apply to affected area once (Patient not taking: Reported on 07/23/2019), Disp: 30 g, Rfl: 3 .  Loratadine 10 MG CAPS, Take by mouth. (Patient not taking: Reported on 07/23/2019), Disp: , Rfl:  .  ondansetron (ZOFRAN) 8 MG tablet, Take 1 tablet (8 mg total) by mouth 2 (two) times daily as needed (Nausea or vomiting). (Patient not taking: Reported on 06/28/2019), Disp: 30 tablet, Rfl: 1 .  prochlorperazine (COMPAZINE) 10 MG tablet, Take 1 tablet (10 mg total) by mouth every 6 (six) hours as needed (Nausea or vomiting). (Patient not taking: Reported on 06/28/2019), Disp: 30 tablet, Rfl: 1 No current facility-administered medications for this visit.  Facility-Administered Medications Ordered in Other Visits:  .  heparin lock flush 100 unit/mL, 500 Units, Intravenous, Once, Verlon Au, NP .  sodium  chloride flush (NS) 0.9 % injection 10 mL, 10 mL, Intravenous, PRN, Verlon Au, NP, 10 mL at 08/13/19 1003  Physical exam:  Vitals:   08/13/19 1037  BP: (!) 146/62  Pulse: 65  Resp: 16  Temp: 98.2 F (36.8 C)  TempSrc: Oral  SpO2: 99%  Weight: 165 lb (74.8 kg)  Height: 5\' 9"  (1.753 m)   Physical Exam Cardiovascular:     Rate and Rhythm: Normal rate and regular rhythm.     Heart sounds: Normal heart sounds.  Pulmonary:     Effort: Pulmonary effort is normal.     Breath sounds: Normal breath sounds.  Abdominal:     General: Bowel sounds are normal.     Palpations: Abdomen is soft.  Skin:    General: Skin is warm and dry.  Neurological:     Mental Status: He is alert and oriented to person, place, and time.      CMP Latest Ref Rng & Units 08/13/2019  Glucose 70 - 99 mg/dL 87  BUN 8 - 23 mg/dL 21  Creatinine  0.61 - 1.24 mg/dL 1.38(H)  Sodium 135 - 145 mmol/L 139  Potassium 3.5 - 5.1 mmol/L 4.3  Chloride 98 - 111 mmol/L 107  CO2 22 - 32 mmol/L 25  Calcium 8.9 - 10.3 mg/dL 8.6(L)  Total Protein 6.5 - 8.1 g/dL 6.6  Total Bilirubin 0.3 - 1.2 mg/dL 0.6  Alkaline Phos 38 - 126 U/L 114  AST 15 - 41 U/L 22  ALT 0 - 44 U/L 18   CBC Latest Ref Rng & Units 08/13/2019  WBC 4.0 - 10.5 K/uL 8.3  Hemoglobin 13.0 - 17.0 g/dL 11.3(L)  Hematocrit 39 - 52 % 34.5(L)  Platelets 150 - 400 K/uL 221    No images are attached to the encounter.  No results found.   Assessment and plan- Patient is a 86 y.o. male  with stage IV metastatic malignant melanoma with liver and lung metastases.  He is here for on treatment assessment prior to cycle 3 of palliative Keytruda  Counts okay to proceed with cycle 3 of palliative Keytruda today.  He will be seen back in 3 weeks for cycle 4 by Dr. Janese Banks.  Plan to repeat scans after 4 cycles.  Patient is tolerating treatment well without any significant side effects at this time  CKD: He has baseline CKD but his creatinine remained stable around 1.4.   Continue to monitor  Visit Diagnosis 1. Metastatic melanoma ([place])      Dr. Randa Evens, MD, MPH [facility] [phone] 08/13/2019 10:48 AM

## 2019-08-14 LAB — T4: T4, Total: 5.6 ug/dL (ref 4.5–12.0)

## 2019-09-03 ENCOUNTER — Inpatient Hospital Stay (HOSPITAL_BASED_OUTPATIENT_CLINIC_OR_DEPARTMENT_OTHER): Payer: Medicare Other | Admitting: Oncology

## 2019-09-03 ENCOUNTER — Encounter: Payer: Self-pay | Admitting: Oncology

## 2019-09-03 ENCOUNTER — Inpatient Hospital Stay: Payer: Medicare Other

## 2019-09-03 ENCOUNTER — Other Ambulatory Visit: Payer: Self-pay

## 2019-09-03 VITALS — BP 92/60 | HR 75 | Temp 96.3°F | Resp 16 | Wt 166.4 lb

## 2019-09-03 DIAGNOSIS — C439 Malignant melanoma of skin, unspecified: Secondary | ICD-10-CM | POA: Diagnosis not present

## 2019-09-03 DIAGNOSIS — C799 Secondary malignant neoplasm of unspecified site: Secondary | ICD-10-CM

## 2019-09-03 DIAGNOSIS — N183 Chronic kidney disease, stage 3 unspecified: Secondary | ICD-10-CM

## 2019-09-03 DIAGNOSIS — Z5112 Encounter for antineoplastic immunotherapy: Secondary | ICD-10-CM

## 2019-09-03 LAB — CBC WITH DIFFERENTIAL/PLATELET
Abs Immature Granulocytes: 0.04 10*3/uL (ref 0.00–0.07)
Basophils Absolute: 0 10*3/uL (ref 0.0–0.1)
Basophils Relative: 1 %
Eosinophils Absolute: 0.3 10*3/uL (ref 0.0–0.5)
Eosinophils Relative: 5 %
HCT: 37.4 % — ABNORMAL LOW (ref 39.0–52.0)
Hemoglobin: 12.3 g/dL — ABNORMAL LOW (ref 13.0–17.0)
Immature Granulocytes: 1 %
Lymphocytes Relative: 33 %
Lymphs Abs: 2.4 10*3/uL (ref 0.7–4.0)
MCH: 31.7 pg (ref 26.0–34.0)
MCHC: 32.9 g/dL (ref 30.0–36.0)
MCV: 96.4 fL (ref 80.0–100.0)
Monocytes Absolute: 0.3 10*3/uL (ref 0.1–1.0)
Monocytes Relative: 5 %
Neutro Abs: 4 10*3/uL (ref 1.7–7.7)
Neutrophils Relative %: 55 %
Platelets: 190 10*3/uL (ref 150–400)
RBC: 3.88 MIL/uL — ABNORMAL LOW (ref 4.22–5.81)
RDW: 12.9 % (ref 11.5–15.5)
WBC: 7.1 10*3/uL (ref 4.0–10.5)
nRBC: 0 % (ref 0.0–0.2)

## 2019-09-03 LAB — COMPREHENSIVE METABOLIC PANEL
ALT: 17 U/L (ref 0–44)
AST: 31 U/L (ref 15–41)
Albumin: 3.9 g/dL (ref 3.5–5.0)
Alkaline Phosphatase: 100 U/L (ref 38–126)
Anion gap: 8 (ref 5–15)
BUN: 17 mg/dL (ref 8–23)
CO2: 23 mmol/L (ref 22–32)
Calcium: 8.6 mg/dL — ABNORMAL LOW (ref 8.9–10.3)
Chloride: 106 mmol/L (ref 98–111)
Creatinine, Ser: 1.5 mg/dL — ABNORMAL HIGH (ref 0.61–1.24)
GFR calc Af Amer: 46 mL/min — ABNORMAL LOW (ref >60)
GFR calc non Af Amer: 40 mL/min — ABNORMAL LOW (ref >60)
Glucose, Bld: 129 mg/dL — ABNORMAL HIGH (ref 70–99)
Potassium: 3.9 mmol/L (ref 3.5–5.1)
Sodium: 137 mmol/L (ref 135–145)
Total Bilirubin: 0.6 mg/dL (ref 0.3–1.2)
Total Protein: 6.8 g/dL (ref 6.5–8.1)

## 2019-09-03 LAB — TSH: TSH: 2.403 u[IU]/mL (ref 0.350–4.500)

## 2019-09-03 MED ORDER — HEPARIN SOD (PORK) LOCK FLUSH 100 UNIT/ML IV SOLN
INTRAVENOUS | Status: AC
  Filled 2019-09-03: qty 5

## 2019-09-03 MED ORDER — HEPARIN SOD (PORK) LOCK FLUSH 100 UNIT/ML IV SOLN
500.0000 [IU] | Freq: Once | INTRAVENOUS | Status: AC
  Administered 2019-09-03: 500 [IU] via INTRAVENOUS
  Filled 2019-09-03: qty 5

## 2019-09-03 MED ORDER — HEPARIN SOD (PORK) LOCK FLUSH 100 UNIT/ML IV SOLN
500.0000 [IU] | Freq: Once | INTRAVENOUS | Status: DC | PRN
  Filled 2019-09-03: qty 5

## 2019-09-03 MED ORDER — SODIUM CHLORIDE 0.9 % IV SOLN
Freq: Once | INTRAVENOUS | Status: AC
  Filled 2019-09-03: qty 250

## 2019-09-03 MED ORDER — SODIUM CHLORIDE 0.9 % IV SOLN
200.0000 mg | Freq: Once | INTRAVENOUS | Status: AC
  Administered 2019-09-03: 200 mg via INTRAVENOUS
  Filled 2019-09-03: qty 8

## 2019-09-03 MED ORDER — SODIUM CHLORIDE 0.9% FLUSH
10.0000 mL | INTRAVENOUS | Status: DC | PRN
  Administered 2019-09-03: 10 mL via INTRAVENOUS
  Filled 2019-09-03: qty 10

## 2019-09-03 NOTE — Progress Notes (Signed)
Hematology/Oncology Consult note Omar [facility]ro[facility]  [phone] Fax:(336) [phone]  Patient Care Team: Omar Pink, MD as PCP - General (Family Medicine)   Name of the patient: Omar Schroeder  [id number]  02-14-1924   Date of visit: 09/03/19  Diagnosis- stage IV metastatic malignant melanoma with lung and liver metastases  Chief complaint/ Reason for visit-on treatment assessment prior to cycle 4 of palliative Keytruda  Heme/Onc history: Patient is an [age] year old male who lives alone and is independent of his ADLs. He has no significant comorbidities other than hyperlipidemia and GERD. He has a history of superficial skin cancer. He presented to the ER with symptoms of right-sided flank pain and had a CT renal stone study which showed a 2.1 x 1.1 cm right lower lobe pulmonary nodule. Also noted to have multiple new ill-defined lesions throughout the right lobe of the liver largest measuring 3.4 x 2.8 cm. Spleen and pancreas was unremarkable. Low-attenuation lesion in the right kidney 2.6 x 1.2 cm likely representing a cyst. No intra-abdominal adenopathy. 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 MRI brain was negative for metastases.   Interval history-patient reports doing well and denies any complaints at this time.  He is tolerating treatment well without any significant side effects.  Reports no increased shortness of breath or diarrhea  ECOG PS- 1 Pain scale- 0   Review of systems- Review of Systems  Constitutional: Negative for chills, fever, malaise/fatigue and weight loss.  HENT: Negative for congestion, ear discharge and nosebleeds.   Eyes:  Negative for blurred vision.  Respiratory: Negative for cough, hemoptysis, sputum production, shortness of breath and wheezing.   Cardiovascular: Negative for chest pain, palpitations, orthopnea and claudication.  Gastrointestinal: Negative for abdominal pain, blood in stool, constipation, diarrhea, heartburn, melena, nausea and vomiting.  Genitourinary: Negative for dysuria, flank pain, frequency, hematuria and urgency.  Musculoskeletal: Negative for back pain, joint pain and myalgias.  Skin: Negative for rash.  Neurological: Negative for dizziness, tingling, focal weakness, seizures, weakness and headaches.  Endo/Heme/Allergies: Does not bruise/bleed easily.  Psychiatric/Behavioral: Negative for depression and suicidal ideas. The patient does not have insomnia.       No Known Allergies   Past Medical History:  Diagnosis Date  . Cancer ([place])    skin melanoma     Past Surgical History:  Procedure Laterality Date  . CATARACT EXTRACTION Bilateral 2000  . HERNIA REPAIR    . MELANOMA EXCISION    . PORTA CATH INSERTION N/A 06/28/2019   Procedure: PORTA CATH INSERTION;  Surgeon: Omar Huxley, MD;  Location: [place] [facility];  Service: Cardiovascular;  Laterality: N/A;    Social History   Socioeconomic History  . Marital status: Widowed    Spouse name: Not on file  . Number of children: Not on file  . Years of education: Not on file  . Highest education level: Not on file  Occupational History  . Not on file  Tobacco Use  . Smoking status: Never Smoker  . Smokeless tobacco: Never Used  Vaping Use  . Vaping Use: Never used  Substance and Sexual Activity  . Alcohol use: Never  . Drug use: Not Currently  . Sexual activity: Not Currently  Other Topics Concern  . Not on file  Social History Narrative   Lives at home by himself    Social Determinants of Health   Financial Resource Strain:   . Difficulty of Paying Living Expenses: Not on file  Food Insecurity:   .  Worried About Charity fundraiser in the Last Year: Not on file  . Ran Out of Food in the Last Year: Not on file  Transportation Needs:   . Lack of Transportation (Medical): Not on file  . Lack of Transportation (Non-Medical): Not on file  Physical Activity:   . Days of Exercise per Week: Not on file  . Minutes of Exercise per Session: Not on file  Stress:   . Feeling of Stress : Not on file  Social Connections:   . Frequency of Communication with Friends and Family: Not on file  . Frequency of Social Gatherings with Friends and Family: Not on file  . Attends Religious Services: Not on file  . Active Member of Clubs or Organizations: Not on file  . Attends Archivist Meetings: Not on file  . Marital Status: Not on file  Intimate Partner Violence:   . Fear of Current or Ex-Partner: Not on file  . Emotionally Abused: Not on file  . Physically Abused: Not on file  . Sexually Abused: Not on file    No family history on file.   Current Outpatient Medications:  .  traMADol (ULTRAM) 50 MG tablet, TAKE 1 TABLET (50 MG TOTAL) BY MOUTH EVERY 6 (SIX) HOURS AS NEEDED., Disp: 60 tablet, Rfl: 0 .  acetaminophen (TYLENOL) 325 MG tablet, Take 650 mg by mouth every 6 (six) hours as needed. (Patient not taking: Reported on 08/13/2019), Disp: , Rfl:  .  lidocaine-prilocaine (EMLA) cream, Apply to affected area once (Patient not taking: Reported on 07/23/2019), Disp: 30 g, Rfl: 3 .  Loratadine 10 MG CAPS, Take by mouth. (Patient not taking: Reported on 07/23/2019), Disp: , Rfl:  .  ondansetron (ZOFRAN) 8 MG tablet, Take 1 tablet (8 mg total) by mouth 2 (two) times daily as needed (Nausea or vomiting). (Patient not taking: Reported on 06/28/2019), Disp: 30 tablet, Rfl: 1 .  prochlorperazine (COMPAZINE) 10 MG tablet, Take 1 tablet (10 mg total) by mouth every 6 (six) hours as needed (Nausea or vomiting). (Patient not taking: Reported on 06/28/2019), Disp: 30 tablet, Rfl: 1 No current  facility-administered medications for this visit.  Facility-Administered Medications Ordered in Other Visits:  .  heparin lock flush 100 unit/mL, 500 Units, Intravenous, Once, Omar Guadeloupe, MD .  heparin lock flush 100 unit/mL, 500 Units, Intracatheter, Once PRN, Omar Guadeloupe, MD .  pembrolizumab [facility]) 200 mg in sodium chloride 0.9 % 50 mL chemo infusion, 200 mg, Intravenous, Once, Omar Guadeloupe, MD .  sodium chloride flush (NS) 0.9 % injection 10 mL, 10 mL, Intravenous, PRN, Omar Guadeloupe, MD, 10 mL at 09/03/19 0840  Physical exam:  Vitals:   09/03/19 0922  BP: 92/60  Pulse: 75  Resp: 16  Temp: (!) 96.3 F (35.7 C)  TempSrc: Tympanic  SpO2: 96%  Weight: 166 lb 6.4 oz (75.5 kg)   Physical Exam Cardiovascular:     Rate and Rhythm: Normal rate and regular rhythm.     Heart sounds: Normal heart sounds.  Pulmonary:     Effort: Pulmonary effort is normal.     Breath sounds: Normal breath sounds.  Abdominal:     General: Bowel sounds  are normal.     Palpations: Abdomen is soft.  Skin:    General: Skin is warm and dry.  Neurological:     Mental Status: He is alert and oriented to person, place, and time.      CMP Latest Ref Rng & Units 09/03/2019  Glucose 70 - 99 mg/dL 129(H)  BUN 8 - 23 mg/dL 17  Creatinine 0.61 - 1.24 mg/dL 1.50(H)  Sodium 135 - 145 mmol/L 137  Potassium 3.5 - 5.1 mmol/L 3.9  Chloride 98 - 111 mmol/L 106  CO2 22 - 32 mmol/L 23  Calcium 8.9 - 10.3 mg/dL 8.6(L)  Total Protein 6.5 - 8.1 g/dL 6.8  Total Bilirubin 0.3 - 1.2 mg/dL 0.6  Alkaline Phos 38 - 126 U/L 100  AST 15 - 41 U/L 31  ALT 0 - 44 U/L 17   CBC Latest Ref Rng & Units 09/03/2019  WBC 4.0 - 10.5 K/uL 7.1  Hemoglobin 13.0 - 17.0 g/dL 12.3(L)  Hematocrit 39 - 52 % 37.4(L)  Platelets 150 - 400 K/uL 190     Assessment and plan- Patient is a 86 y.o. 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   He is here for on treatment assessment prior to cycle  4 of palliative Keytruda  Counts okay to proceed with cycle 4 of palliative Keytruda today.  I will repeat CT chest abdomen pelvis with contrast in 2 weeks time and see him back in 3 weeks for cycle 5.  So far he is tolerating Keytruda well without any significant side effects.  He does have baseline CKD and his kidney functions fluctuate from a creatinine of 1.3-1.5.  Overall stable continue to monitor   Visit Diagnosis 1. Metastatic melanoma ([place])   2. Encounter for antineoplastic immunotherapy   3. Stage 3 chronic kidney disease, unspecified whether stage 3a or 3b CKD      Dr. Randa Evens, MD, MPH [facility] [phone] 09/03/2019 10:40 AM

## 2019-09-04 ENCOUNTER — Ambulatory Visit: Payer: Medicare Other | Admitting: Oncology

## 2019-09-04 ENCOUNTER — Other Ambulatory Visit: Payer: Medicare Other

## 2019-09-04 ENCOUNTER — Ambulatory Visit: Payer: Medicare Other

## 2019-09-04 LAB — T4: T4, Total: 5.4 ug/dL (ref 4.5–12.0)

## 2019-09-18 ENCOUNTER — Telehealth: Payer: Self-pay | Admitting: Oncology

## 2019-09-18 NOTE — Telephone Encounter (Signed)
Writer spoke with patient on this date and moved patient's appt time to 10:45 on 09-24-19.

## 2019-09-21 ENCOUNTER — Ambulatory Visit
Admission: RE | Admit: 2019-09-21 | Discharge: 2019-09-21 | Disposition: A | Discharge status: Home / Self Care | Payer: Medicare Other | Source: Ambulatory Visit | Attending: Oncology | Admitting: Oncology

## 2019-09-21 ENCOUNTER — Other Ambulatory Visit: Payer: Self-pay

## 2019-09-21 ENCOUNTER — Telehealth: Payer: Self-pay | Admitting: *Deleted

## 2019-09-21 DIAGNOSIS — C799 Secondary malignant neoplasm of unspecified site: Secondary | ICD-10-CM | POA: Diagnosis present

## 2019-09-21 DIAGNOSIS — M4856XA Collapsed vertebra, not elsewhere classified, lumbar region, initial encounter for fracture: Secondary | ICD-10-CM | POA: Diagnosis not present

## 2019-09-21 DIAGNOSIS — I700 Atherosclerosis of aorta: Secondary | ICD-10-CM | POA: Diagnosis not present

## 2019-09-21 DIAGNOSIS — K5730 Diverticulosis of large intestine without perforation or abscess without bleeding: Secondary | ICD-10-CM | POA: Insufficient documentation

## 2019-09-21 DIAGNOSIS — C439 Malignant melanoma of skin, unspecified: Secondary | ICD-10-CM

## 2019-09-21 MED ORDER — IOHEXOL 300 MG/ML  SOLN
75.0000 mL | Freq: Once | INTRAMUSCULAR | Status: AC | PRN
  Administered 2019-09-21: 75 mL via INTRAVENOUS

## 2019-09-21 NOTE — Telephone Encounter (Signed)
Please call radiology for critical results.

## 2019-09-21 NOTE — Telephone Encounter (Signed)
Dr Janese Banks saw message and spoke to radiology and saw report and she will speak to pt when he comes next week

## 2019-09-24 ENCOUNTER — Other Ambulatory Visit: Payer: Self-pay

## 2019-09-24 ENCOUNTER — Ambulatory Visit: Payer: Medicare Other

## 2019-09-24 ENCOUNTER — Ambulatory Visit: Payer: Medicare Other | Admitting: Oncology

## 2019-09-24 ENCOUNTER — Inpatient Hospital Stay (HOSPITAL_BASED_OUTPATIENT_CLINIC_OR_DEPARTMENT_OTHER): Payer: Medicare Other | Admitting: Oncology

## 2019-09-24 ENCOUNTER — Inpatient Hospital Stay: Payer: Medicare Other | Attending: Oncology

## 2019-09-24 ENCOUNTER — Other Ambulatory Visit: Payer: Medicare Other

## 2019-09-24 ENCOUNTER — Inpatient Hospital Stay: Payer: Medicare Other

## 2019-09-24 VITALS — BP 120/62 | HR 56 | Temp 97.8°F | Resp 16 | Ht 69.0 in | Wt 163.8 lb

## 2019-09-24 DIAGNOSIS — C787 Secondary malignant neoplasm of liver and intrahepatic bile duct: Secondary | ICD-10-CM | POA: Insufficient documentation

## 2019-09-24 DIAGNOSIS — K5730 Diverticulosis of large intestine without perforation or abscess without bleeding: Secondary | ICD-10-CM | POA: Diagnosis not present

## 2019-09-24 DIAGNOSIS — M4856XA Collapsed vertebra, not elsewhere classified, lumbar region, initial encounter for fracture: Secondary | ICD-10-CM | POA: Diagnosis not present

## 2019-09-24 DIAGNOSIS — E785 Hyperlipidemia, unspecified: Secondary | ICD-10-CM | POA: Diagnosis not present

## 2019-09-24 DIAGNOSIS — C439 Malignant melanoma of skin, unspecified: Secondary | ICD-10-CM | POA: Insufficient documentation

## 2019-09-24 DIAGNOSIS — K219 Gastro-esophageal reflux disease without esophagitis: Secondary | ICD-10-CM | POA: Insufficient documentation

## 2019-09-24 DIAGNOSIS — C7800 Secondary malignant neoplasm of unspecified lung: Secondary | ICD-10-CM | POA: Insufficient documentation

## 2019-09-24 DIAGNOSIS — C799 Secondary malignant neoplasm of unspecified site: Secondary | ICD-10-CM | POA: Diagnosis not present

## 2019-09-24 DIAGNOSIS — M545 Low back pain: Secondary | ICD-10-CM | POA: Insufficient documentation

## 2019-09-24 DIAGNOSIS — I700 Atherosclerosis of aorta: Secondary | ICD-10-CM | POA: Insufficient documentation

## 2019-09-24 DIAGNOSIS — Z79899 Other long term (current) drug therapy: Secondary | ICD-10-CM | POA: Insufficient documentation

## 2019-09-24 DIAGNOSIS — Z5112 Encounter for antineoplastic immunotherapy: Secondary | ICD-10-CM | POA: Diagnosis not present

## 2019-09-24 DIAGNOSIS — N281 Cyst of kidney, acquired: Secondary | ICD-10-CM | POA: Insufficient documentation

## 2019-09-24 LAB — COMPREHENSIVE METABOLIC PANEL
ALT: 16 U/L (ref 0–44)
AST: 21 U/L (ref 15–41)
Albumin: 4 g/dL (ref 3.5–5.0)
Alkaline Phosphatase: 107 U/L (ref 38–126)
Anion gap: 9 (ref 5–15)
BUN: 19 mg/dL (ref 8–23)
CO2: 24 mmol/L (ref 22–32)
Calcium: 8.8 mg/dL — ABNORMAL LOW (ref 8.9–10.3)
Chloride: 107 mmol/L (ref 98–111)
Creatinine, Ser: 1.18 mg/dL (ref 0.61–1.24)
GFR calc Af Amer: >60 mL/min (ref >60)
GFR calc non Af Amer: 53 mL/min — ABNORMAL LOW (ref >60)
Glucose, Bld: 72 mg/dL (ref 70–99)
Potassium: 4.3 mmol/L (ref 3.5–5.1)
Sodium: 140 mmol/L (ref 135–145)
Total Bilirubin: 0.5 mg/dL (ref 0.3–1.2)
Total Protein: 7 g/dL (ref 6.5–8.1)

## 2019-09-24 LAB — CBC WITH DIFFERENTIAL/PLATELET
Abs Immature Granulocytes: 0.01 10*3/uL (ref 0.00–0.07)
Basophils Absolute: 0 10*3/uL (ref 0.0–0.1)
Basophils Relative: 0 %
Eosinophils Absolute: 0.4 10*3/uL (ref 0.0–0.5)
Eosinophils Relative: 5 %
HCT: 39.9 % (ref 39.0–52.0)
Hemoglobin: 13.1 g/dL (ref 13.0–17.0)
Immature Granulocytes: 0 %
Lymphocytes Relative: 34 %
Lymphs Abs: 2.7 10*3/uL (ref 0.7–4.0)
MCH: 31.1 pg (ref 26.0–34.0)
MCHC: 32.8 g/dL (ref 30.0–36.0)
MCV: 94.8 fL (ref 80.0–100.0)
Monocytes Absolute: 0.6 10*3/uL (ref 0.1–1.0)
Monocytes Relative: 8 %
Neutro Abs: 4.2 10*3/uL (ref 1.7–7.7)
Neutrophils Relative %: 53 %
Platelets: 173 10*3/uL (ref 150–400)
RBC: 4.21 MIL/uL — ABNORMAL LOW (ref 4.22–5.81)
RDW: 12.5 % (ref 11.5–15.5)
WBC: 7.9 10*3/uL (ref 4.0–10.5)
nRBC: 0 % (ref 0.0–0.2)

## 2019-09-24 MED ORDER — HEPARIN SOD (PORK) LOCK FLUSH 100 UNIT/ML IV SOLN
500.0000 [IU] | Freq: Once | INTRAVENOUS | Status: AC
  Administered 2019-09-24: 500 [IU] via INTRAVENOUS
  Filled 2019-09-24: qty 5

## 2019-09-24 MED ORDER — SODIUM CHLORIDE 0.9% FLUSH
10.0000 mL | Freq: Once | INTRAVENOUS | Status: AC
  Administered 2019-09-24: 10 mL via INTRAVENOUS
  Filled 2019-09-24: qty 10

## 2019-09-24 MED ORDER — SODIUM CHLORIDE 0.9 % IV SOLN
200.0000 mg | Freq: Once | INTRAVENOUS | Status: AC
  Administered 2019-09-24: 200 mg via INTRAVENOUS
  Filled 2019-09-24: qty 8

## 2019-09-24 MED ORDER — SODIUM CHLORIDE 0.9 % IV SOLN
Freq: Once | INTRAVENOUS | Status: AC
  Filled 2019-09-24: qty 250

## 2019-09-24 NOTE — Progress Notes (Signed)
Pt states he is doing good, no concerns. Eating and drinking good. Bowels are good. No pain

## 2019-09-25 ENCOUNTER — Telehealth: Payer: Self-pay | Admitting: *Deleted

## 2019-09-25 ENCOUNTER — Encounter: Payer: Self-pay | Admitting: Oncology

## 2019-09-25 NOTE — Telephone Encounter (Signed)
Called and spoke to Granddaughter of pt just for her to keep and eye on the pt. He has compression fx. And he has no pain. At this time since he does not have pain, Dr. Janese Banks does not plan to anything however if he starts having pain then we will send him ref. She will keep up with him and check on him. She has noticed that he is loosing wt. And she is correct but says that he eats good. She will speak to her aunt who eats with him and cooks for him for verbal reminders about making sure he is eating 3 times a day

## 2019-09-25 NOTE — Progress Notes (Signed)
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 He has no significant comorbidities other than hyperlipidemia and GERD. He has a history of superficial skin cancer. He presented to the ER with symptoms of right-sided flank pain and had a CT renal stone study which showed a 2.1 x 1.1 cm right lower lobe pulmonary nodule. Also noted to have multiple new ill-defined lesions throughout the right lobe of the liver largest measuring 3.4 x 2.8 cm. Spleen and pancreas was unremarkable. Low-attenuation lesion in the right kidney 2.6 x 1.2 cm likely representing a cyst. No intra-abdominal adenopathy. 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 PD-L1 60%.  Negative for BRAF.  MRI brain was negative for metastases.  Interval history-reports doing well overall.  Denies any side effects from treatment so far.  He has chronic mild low back pain but does not report any worsening of his back pain and has not required significant use of pain medications other than occasional Tylenol.  ECOG PS- 1 Pain scale- 0   Review of  systems- Review of Systems  Constitutional: Negative for chills, fever, malaise/fatigue and weight loss.  HENT: Negative for congestion, ear discharge and nosebleeds.   Eyes: Negative for blurred vision.  Respiratory: Negative for cough, hemoptysis, sputum production, shortness of breath and wheezing.   Cardiovascular: Negative for chest pain, palpitations, orthopnea and claudication.  Gastrointestinal: Negative for abdominal pain, blood in stool, constipation, diarrhea, heartburn, melena, nausea and vomiting.  Genitourinary: Negative for dysuria, flank pain, frequency, hematuria and urgency.  Musculoskeletal: Negative for back pain, joint pain and myalgias.  Skin: Negative for rash.  Neurological: Negative for dizziness, tingling, focal weakness, seizures, weakness and headaches.  Endo/Heme/Allergies: Does not bruise/bleed easily.  Psychiatric/Behavioral: Negative for depression and suicidal ideas. The patient does not have insomnia.      No Known Allergies   Past Medical History:  Diagnosis Date  . Cancer ([place])    skin melanoma     Past Surgical History:  Procedure Laterality Date  . CATARACT EXTRACTION Bilateral 2000  . HERNIA REPAIR    . MELANOMA EXCISION    . PORTA CATH INSERTION N/A 06/28/2019   Procedure: PORTA CATH INSERTION;  Surgeon: Algernon Huxley, MD;  Location: [facility];  Service: Cardiovascular;  Laterality: N/A;    Social History   Socioeconomic History  . Marital status: Widowed    Spouse name: Not on file  . Number of children: Not on file  . Years of education: Not on file  . Highest education level: Not on file  Occupational History  . Not on file  Tobacco Use  . Smoking status: Never Smoker  .  Smokeless tobacco: Never Used  Vaping Use  . Vaping Use: Never used  Substance and Sexual Activity  . Alcohol use: Never  . Drug use: Not Currently  . Sexual activity: Not Currently  Other Topics Concern  . Not on file  Social History Narrative    Lives at home by himself    Social Determinants of Health   Financial Resource Strain:   . Difficulty of Paying Living Expenses: Not on file  Food Insecurity:   . Worried About Charity fundraiser in the Last Year: Not on file  . Ran Out of Food in the Last Year: Not on file  Transportation Needs:   . Lack of Transportation (Medical): Not on file  . Lack of Transportation (Non-Medical): Not on file  Physical Activity:   . Days of Exercise per Week: Not on file  . Minutes of Exercise per Session: Not on file  Stress:   . Feeling of Stress : Not on file  Social Connections:   . Frequency of Communication with Friends and Family: Not on file  . Frequency of Social Gatherings with Friends and Family: Not on file  . Attends Religious Services: Not on file  . Active Member of Clubs or Organizations: Not on file  . Attends Archivist Meetings: Not on file  . Marital Status: Not on file  Intimate Partner Violence:   . Fear of Current or Ex-Partner: Not on file  . Emotionally Abused: Not on file  . Physically Abused: Not on file  . Sexually Abused: Not on file    No family history on file.   Current Outpatient Medications:  .  acetaminophen (TYLENOL) 325 MG tablet, Take 650 mg by mouth every 6 (six) hours as needed. , Disp: , Rfl:  .  Loratadine 10 MG CAPS, Take 1 capsule by mouth daily as needed. , Disp: , Rfl:  .  lidocaine-prilocaine (EMLA) cream, Apply to affected area once (Patient not taking: Reported on 07/23/2019), Disp: 30 g, Rfl: 3 .  ondansetron (ZOFRAN) 8 MG tablet, Take 1 tablet (8 mg total) by mouth 2 (two) times daily as needed (Nausea or vomiting). (Patient not taking: Reported on 06/28/2019), Disp: 30 tablet, Rfl: 1 .  prochlorperazine (COMPAZINE) 10 MG tablet, Take 1 tablet (10 mg total) by mouth every 6 (six) hours as needed (Nausea or vomiting). (Patient not taking: Reported on 06/28/2019), Disp: 30 tablet, Rfl: 1 .  traMADol (ULTRAM) 50 MG tablet, TAKE 1  TABLET (50 MG TOTAL) BY MOUTH EVERY 6 (SIX) HOURS AS NEEDED. (Patient not taking: Reported on 09/24/2019), Disp: 60 tablet, Rfl: 0  Physical exam:  Vitals:   09/24/19 1213  BP: 120/62  Pulse: (!) 56  Resp: 16  Temp: 97.8 F (36.6 C)  TempSrc: Oral  Weight: 163 lb 12.8 oz (74.3 kg)  Height: '5\' 9"'  (1.753 m)   Physical Exam Constitutional:      General: He is not in acute distress. Cardiovascular:     Rate and Rhythm: Normal rate and regular rhythm.     Heart sounds: Normal heart sounds.  Pulmonary:     Effort: Pulmonary effort is normal.     Breath sounds: Normal breath sounds.  Abdominal:     General: Bowel sounds are normal.     Palpations: Abdomen is soft.  Skin:    General: Skin is warm and dry.  Neurological:     Mental Status: He is alert and oriented to person, place, and time.  CMP Latest Ref Rng & Units 09/24/2019  Glucose 70 - 99 mg/dL 72  BUN 8 - 23 mg/dL 19  Creatinine 0.61 - 1.24 mg/dL 1.18  Sodium 135 - 145 mmol/L 140  Potassium 3.5 - 5.1 mmol/L 4.3  Chloride 98 - 111 mmol/L 107  CO2 22 - 32 mmol/L 24  Calcium 8.9 - 10.3 mg/dL 8.8(L)  Total Protein 6.5 - 8.1 g/dL 7.0  Total Bilirubin 0.3 - 1.2 mg/dL 0.5  Alkaline Phos 38 - 126 U/L 107  AST 15 - 41 U/L 21  ALT 0 - 44 U/L 16   CBC Latest Ref Rng & Units 09/24/2019  WBC 4.0 - 10.5 K/uL 7.9  Hemoglobin 13.0 - 17.0 g/dL 13.1  Hematocrit 39 - 52 % 39.9  Platelets 150 - 400 K/uL 173    No images are attached to the encounter.  CT CHEST ABDOMEN PELVIS W CONTRAST  Result Date: 09/21/2019 CLINICAL DATA:  History of melanoma EXAM: CT CHEST, ABDOMEN, AND PELVIS WITH CONTRAST TECHNIQUE: Multidetector CT imaging of the chest, abdomen and pelvis was performed following the standard protocol during bolus administration of intravenous contrast. CONTRAST:  39m OMNIPAQUE IOHEXOL 300 MG/ML  SOLN COMPARISON:  May 28, 2016 FINDINGS: CT CHEST FINDINGS Cardiovascular: Calcified atheromatous plaque of the thoracic  aorta without aneurysmal dilation. RIGHT-sided Port-A-Cath terminates at the caval to atrial junction. Heart size is stable without pericardial effusion. LEFT atrial and RIGHT heart enlargement is similar to the prior exam. Central pulmonary vasculature with normal appearance on venous phase assessment. Mediastinum/Nodes: Thoracic inlet structures are normal. No axillary lymphadenopathy. No mediastinal adenopathy.  Esophagus grossly normal. Lungs/Pleura: Elevation of the RIGHT hemidiaphragm with juxta diaphragmatic atelectatic changes unchanged. Diminished size of metastatic focus in the RIGHT lung (image 51, series 4) 8 x 5 mm previously 15 x 11 mm. Small medial RIGHT apical nodule seen in the medial RIGHT lung apex previously has nearly completely resolved now measuring approximately 2 mm previously approximately 6 5-6 mm (image 26, series 4) no consolidation. No pleural effusion. No new suspicious pulmonary nodule. Airways are patent. Marked interval improvement of a nodule at the RIGHT lung base medially (image 90 of series 4) 11 x 8 mm previously 22 x 17 mm. A 20 mm nodule at the RIGHT lung base seen on the prior study has since resolved. Musculoskeletal: See below for full musculoskeletal details. CT ABDOMEN PELVIS FINDINGS Hepatobiliary: Multiple hepatic lesions in the RIGHT hepatic lobe considerably decreased in size. Dominant lesion in the RIGHT hemi liver on image 54 of series 2 measuring 13 mm greatest axial dimension previously 33 mm. Medial segment LEFT lobe and anterior division RIGHT lobe atrophy with no change. Second lesion in the RIGHT hemi liver at the periphery measuring approximately 16 mm greatest axial dimension previously 25 mm with similar decrease in size of other hepatic lesions, no new hepatic abnormality. No pericholecystic stranding. No biliary duct dilation. Pancreas: Pancreas is normal without ductal dilation or sign of inflammation. Spleen: Spleen normal in size and contour.  Adrenals/Urinary Tract: Adrenal glands are normal. Symmetric renal enhancement. Small cyst in the RIGHT kidney interpolar aspect unchanged. No hydronephrosis. Urinary bladder under distended. Stomach/Bowel: No acute gastrointestinal process. Normal appendix. The colon beneath the RIGHT hemidiaphragm with marked RIGHT hemidiaphragmatic elevation as before. Colonic diverticulosis without signs of diverticulitis. Vascular/Lymphatic: Calcified atheromatous plaque and noncalcified plaque in the abdominal aorta. There is no gastrohepatic or hepatoduodenal ligament lymphadenopathy. No retroperitoneal or mesenteric lymphadenopathy. No pelvic sidewall lymphadenopathy. Reproductive: Marked prostate heterogeneity  which is similar to the previous study, nonspecific finding on CT. Other: No ascites.  No abdominal wall hernia. Musculoskeletal: Loss of height at the L4 vertebral level due to compression fracture, now approximately 40-50% loss of height. Similar appearance of compression fracture at L3. Mild loss of height, continued loss of height at L2 approximately 30% as compared to 10-20% loss of height at this level. T12 and T9 compression fractures with similar appearance as well. No destructive bone process. Spinal degenerative changes. IMPRESSION: 1. Improvement with respect to pulmonary and hepatic metastatic disease as described. No new signs of metastatic disease in the chest, abdomen or pelvis. 2. Worsening of L2 compression fracture with new L4 compression fracture since the prior exam. Correlate with any symptoms of back pain. 3. Less pronounced thickening though still with some irregularity and low attenuation of the gastroesophageal junction. Attention on follow-up or dedicated esophageal evaluation if not yet performed. 4. prostate heterogeneity which is similar to the previous study, nonspecific finding on CT. 5. Colonic diverticulosis without signs of diverticulitis. 6. Aortic atherosclerosis. These results will  be called to the ordering clinician or representative by the Radiologist Assistant, and communication documented in the PACS or [employer]. Aortic Atherosclerosis (ICD10-I70.0). Electronically Signed   By: Zetta Bills M.D.   On: 09/21/2019 13:32     Assessment and plan- Patient is a 86 y.o. 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 He is here for on treatment assessment prior to cycle 5 of palliative Keytruda  I have reviewed CT chest abdomen pelvis images independently and discussed findings with the patient.  Patient is tolerating Keytruda single agent well and recent scans show interval Improvement in lung as well as liver metastases.  CT scan also showed a new L4 compression fracture with worsening of his old L2 compression fracture.  However patient does not have any significant back pain in this area.  I explained to the patient that if his back pain gets worse we can refer him to orthopedics for consideration of kyphoplasty or nonsurgical intervention.  I will see him back in 3 weeks time for cycle 6 of palliative Keytruda   Visit Diagnosis 1. Encounter for antineoplastic immunotherapy   2. Nontraumatic compression fracture of L4 vertebra, initial encounter ([place])   3. Metastatic melanoma ([place])      Dr. Randa Evens, MD, MPH [facility] [phone] 09/25/2019 12:00 PM

## 2019-10-15 ENCOUNTER — Encounter: Payer: Self-pay | Admitting: Oncology

## 2019-10-15 ENCOUNTER — Inpatient Hospital Stay: Payer: Medicare Other | Attending: Oncology

## 2019-10-15 ENCOUNTER — Inpatient Hospital Stay: Payer: Medicare Other

## 2019-10-15 ENCOUNTER — Inpatient Hospital Stay: Payer: Medicare Other | Admitting: Oncology

## 2019-10-15 ENCOUNTER — Inpatient Hospital Stay (HOSPITAL_BASED_OUTPATIENT_CLINIC_OR_DEPARTMENT_OTHER): Payer: Medicare Other | Admitting: Oncology

## 2019-10-15 ENCOUNTER — Other Ambulatory Visit: Payer: Self-pay

## 2019-10-15 VITALS — BP 149/50 | HR 90 | Temp 96.6°F | Resp 16 | Wt 168.4 lb

## 2019-10-15 DIAGNOSIS — Z5112 Encounter for antineoplastic immunotherapy: Secondary | ICD-10-CM

## 2019-10-15 DIAGNOSIS — E785 Hyperlipidemia, unspecified: Secondary | ICD-10-CM | POA: Insufficient documentation

## 2019-10-15 DIAGNOSIS — Z85828 Personal history of other malignant neoplasm of skin: Secondary | ICD-10-CM | POA: Diagnosis not present

## 2019-10-15 DIAGNOSIS — C439 Malignant melanoma of skin, unspecified: Secondary | ICD-10-CM | POA: Insufficient documentation

## 2019-10-15 DIAGNOSIS — N281 Cyst of kidney, acquired: Secondary | ICD-10-CM | POA: Diagnosis not present

## 2019-10-15 DIAGNOSIS — C787 Secondary malignant neoplasm of liver and intrahepatic bile duct: Secondary | ICD-10-CM | POA: Diagnosis not present

## 2019-10-15 DIAGNOSIS — C7800 Secondary malignant neoplasm of unspecified lung: Secondary | ICD-10-CM | POA: Insufficient documentation

## 2019-10-15 DIAGNOSIS — M549 Dorsalgia, unspecified: Secondary | ICD-10-CM | POA: Diagnosis not present

## 2019-10-15 DIAGNOSIS — C799 Secondary malignant neoplasm of unspecified site: Secondary | ICD-10-CM | POA: Diagnosis not present

## 2019-10-15 LAB — CBC WITH DIFFERENTIAL/PLATELET
Abs Immature Granulocytes: 0.02 10*3/uL (ref 0.00–0.07)
Basophils Absolute: 0 10*3/uL (ref 0.0–0.1)
Basophils Relative: 1 %
Eosinophils Absolute: 0.4 10*3/uL (ref 0.0–0.5)
Eosinophils Relative: 4 %
HCT: 38.3 % — ABNORMAL LOW (ref 39.0–52.0)
Hemoglobin: 12.6 g/dL — ABNORMAL LOW (ref 13.0–17.0)
Immature Granulocytes: 0 %
Lymphocytes Relative: 28 %
Lymphs Abs: 2.5 10*3/uL (ref 0.7–4.0)
MCH: 31 pg (ref 26.0–34.0)
MCHC: 32.9 g/dL (ref 30.0–36.0)
MCV: 94.1 fL (ref 80.0–100.0)
Monocytes Absolute: 0.6 10*3/uL (ref 0.1–1.0)
Monocytes Relative: 7 %
Neutro Abs: 5.3 10*3/uL (ref 1.7–7.7)
Neutrophils Relative %: 60 %
Platelets: 165 10*3/uL (ref 150–400)
RBC: 4.07 MIL/uL — ABNORMAL LOW (ref 4.22–5.81)
RDW: 11.9 % (ref 11.5–15.5)
WBC: 8.8 10*3/uL (ref 4.0–10.5)
nRBC: 0 % (ref 0.0–0.2)

## 2019-10-15 LAB — COMPREHENSIVE METABOLIC PANEL
ALT: 13 U/L (ref 0–44)
AST: 24 U/L (ref 15–41)
Albumin: 3.7 g/dL (ref 3.5–5.0)
Alkaline Phosphatase: 108 U/L (ref 38–126)
Anion gap: 7 (ref 5–15)
BUN: 20 mg/dL (ref 8–23)
CO2: 25 mmol/L (ref 22–32)
Calcium: 8.4 mg/dL — ABNORMAL LOW (ref 8.9–10.3)
Chloride: 105 mmol/L (ref 98–111)
Creatinine, Ser: 1.43 mg/dL — ABNORMAL HIGH (ref 0.61–1.24)
GFR calc Af Amer: 49 mL/min — ABNORMAL LOW (ref >60)
GFR calc non Af Amer: 42 mL/min — ABNORMAL LOW (ref >60)
Glucose, Bld: 154 mg/dL — ABNORMAL HIGH (ref 70–99)
Potassium: 4 mmol/L (ref 3.5–5.1)
Sodium: 137 mmol/L (ref 135–145)
Total Bilirubin: 0.6 mg/dL (ref 0.3–1.2)
Total Protein: 6.9 g/dL (ref 6.5–8.1)

## 2019-10-15 MED ORDER — SODIUM CHLORIDE 0.9 % IV SOLN
200.0000 mg | Freq: Once | INTRAVENOUS | Status: AC
  Administered 2019-10-15: 200 mg via INTRAVENOUS
  Filled 2019-10-15: qty 8

## 2019-10-15 MED ORDER — HEPARIN SOD (PORK) LOCK FLUSH 100 UNIT/ML IV SOLN
INTRAVENOUS | Status: AC
  Filled 2019-10-15: qty 5

## 2019-10-15 MED ORDER — HEPARIN SOD (PORK) LOCK FLUSH 100 UNIT/ML IV SOLN
500.0000 [IU] | Freq: Once | INTRAVENOUS | Status: DC | PRN
  Filled 2019-10-15: qty 5

## 2019-10-15 MED ORDER — SODIUM CHLORIDE 0.9% FLUSH
10.0000 mL | Freq: Once | INTRAVENOUS | Status: AC
  Administered 2019-10-15: 10 mL via INTRAVENOUS
  Filled 2019-10-15: qty 10

## 2019-10-15 MED ORDER — HEPARIN SOD (PORK) LOCK FLUSH 100 UNIT/ML IV SOLN
500.0000 [IU] | Freq: Once | INTRAVENOUS | Status: AC
  Administered 2019-10-15: 500 [IU] via INTRAVENOUS
  Filled 2019-10-15: qty 5

## 2019-10-15 MED ORDER — SODIUM CHLORIDE 0.9 % IV SOLN
Freq: Once | INTRAVENOUS | Status: AC
  Filled 2019-10-15: qty 250

## 2019-10-15 NOTE — Progress Notes (Signed)
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 He has no significant comorbidities other than hyperlipidemia and GERD. He has a history of superficial skin cancer. He presented to the ER with symptoms of right-sided flank pain and had a CT renal stone study which showed a 2.1 x 1.1 cm right lower lobe pulmonary nodule. Also noted to have multiple new ill-defined lesions throughout the right lobe of the liver largest measuring 3.4 x 2.8 cm. Spleen and pancreas was unremarkable. Low-attenuation lesion in the right kidney 2.6 x 1.2 cm likely representing a cyst. No intra-abdominal adenopathy. 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 PD-L1 60%.  Negative for BRAF.  MRI brain was negative for metastases.  Single agent Beryle Flock started in June 2021   Interval history- reports that back pain is occasional and well controlled with tylenol. He has not used tramadol. Denies other complaints  ECOG PS- 1 Pain scale- 0   Review of systems- Review of Systems  Constitutional: Negative for chills, fever,  malaise/fatigue and weight loss.  HENT: Negative for congestion, ear discharge and nosebleeds.   Eyes: Negative for blurred vision.  Respiratory: Negative for cough, hemoptysis, sputum production, shortness of breath and wheezing.   Cardiovascular: Negative for chest pain, palpitations, orthopnea and claudication.  Gastrointestinal: Negative for abdominal pain, blood in stool, constipation, diarrhea, heartburn, melena, nausea and vomiting.  Genitourinary: Negative for dysuria, flank pain, frequency, hematuria and urgency.  Musculoskeletal: Positive for back pain. Negative for joint pain and myalgias.  Skin: Negative for rash.  Neurological: Negative for dizziness, tingling, focal weakness, seizures, weakness and headaches.  Endo/Heme/Allergies: Does not bruise/bleed easily.  Psychiatric/Behavioral: Negative for depression and suicidal ideas. The patient does not have insomnia.      No Known Allergies   Past Medical History:  Diagnosis Date  . Cancer ([place])    skin melanoma     Past Surgical History:  Procedure Laterality Date  . CATARACT EXTRACTION Bilateral 2000  . HERNIA REPAIR    . MELANOMA EXCISION    . PORTA CATH INSERTION N/A 06/28/2019   Procedure: PORTA CATH INSERTION;  Surgeon: Algernon Huxley, MD;  Location: [facility];  Service: Cardiovascular;  Laterality: N/A;    Social History   Socioeconomic History  . Marital status: Widowed    Spouse name: Not on file  . Number of children: Not on file  . Years of education: Not on file  . Highest education level: Not on file  Occupational History  . Not on file  Tobacco Use  . Smoking status: Never Smoker  . Smokeless tobacco: Never Used  Vaping Use  . Vaping Use: Never used  Substance and Sexual Activity  . Alcohol use: Never  . Drug use: Not Currently  . Sexual activity: Not Currently  Other Topics Concern  . Not on file  Social History Narrative   Lives at home by himself    Social Determinants of  Health   Financial Resource Strain:   . Difficulty of Paying Living Expenses: Not on file  Food Insecurity:   . Worried About Charity fundraiser in the Last Year: Not on file  . Ran Out of Food in the Last Year: Not on file  Transportation Needs:   . Lack of Transportation (Medical): Not on file  . Lack of Transportation (Non-Medical): Not on file  Physical Activity:   . Days of Exercise per Week: Not on file  . Minutes of Exercise per Session: Not on file  Stress:   . Feeling of Stress : Not on file  Social Connections:   . Frequency of Communication with Friends and Family: Not on file  . Frequency of Social Gatherings with Friends and Family: Not on file  . Attends Religious Services: Not on file  . Active Member of Clubs or Organizations: Not on file  . Attends Archivist Meetings: Not on file  . Marital Status: Not on file  Intimate Partner Violence:   . Fear of Current or Ex-Partner: Not on file  . Emotionally Abused: Not on file  . Physically Abused: Not on file  . Sexually Abused: Not on file    No family history on file.   Current Outpatient Medications:  .  acetaminophen (TYLENOL) 325 MG tablet, Take 650 mg by mouth every 6 (six) hours as needed. , Disp: , Rfl:  .  Loratadine 10 MG CAPS, Take 1 capsule by mouth daily as needed. , Disp: , Rfl:  .  lidocaine-prilocaine (EMLA) cream, Apply to affected area once (Patient not taking: Reported on 07/23/2019), Disp: 30 g, Rfl: 3 .  ondansetron (ZOFRAN) 8 MG tablet, Take 1 tablet (8 mg total) by mouth 2 (two) times daily as needed (Nausea or vomiting). (Patient not taking: Reported on 06/28/2019), Disp: 30 tablet, Rfl: 1 .  prochlorperazine (COMPAZINE) 10 MG tablet, Take 1 tablet (10 mg total) by mouth every 6 (six) hours as needed (Nausea or vomiting). (Patient not taking: Reported on 06/28/2019), Disp: 30 tablet, Rfl: 1 .  traMADol (ULTRAM) 50 MG tablet, TAKE 1 TABLET (50 MG TOTAL) BY MOUTH EVERY 6 (SIX) HOURS AS  NEEDED. (Patient not taking: Reported on 09/24/2019), Disp: 60 tablet, Rfl: 0 No current facility-administered medications for this visit.  Facility-Administered Medications Ordered in Other Visits:  .  heparin lock flush 100 unit/mL, 500 Units, Intracatheter, Once PRN, Sindy Guadeloupe, MD  Physical exam:  Vitals:   10/15/19 1316  BP: (!) 149/50  Pulse: 90  Resp: 16  Temp: (!) 96.6 F (35.9 C)  TempSrc: Tympanic  SpO2: 100%  Weight: 168 lb 6.4 oz (76.4 kg)   Physical Exam Constitutional:      General: He is not in acute distress. Cardiovascular:     Rate and Rhythm: Normal rate and regular rhythm.     Heart sounds: Normal heart sounds.  Pulmonary:     Effort: Pulmonary effort is normal.     Breath sounds: Normal breath sounds.  Abdominal:     General: Bowel sounds are normal.     Palpations: Abdomen is soft.  Skin:    General: Skin is warm and dry.  Neurological:  Mental Status: He is alert and oriented to person, place, and time.      CMP Latest Ref Rng & Units 10/15/2019  Glucose 70 - 99 mg/dL 154(H)  BUN 8 - 23 mg/dL 20  Creatinine 0.61 - 1.24 mg/dL 1.43(H)  Sodium 135 - 145 mmol/L 137  Potassium 3.5 - 5.1 mmol/L 4.0  Chloride 98 - 111 mmol/L 105  CO2 22 - 32 mmol/L 25  Calcium 8.9 - 10.3 mg/dL 8.4(L)  Total Protein 6.5 - 8.1 g/dL 6.9  Total Bilirubin 0.3 - 1.2 mg/dL 0.6  Alkaline Phos 38 - 126 U/L 108  AST 15 - 41 U/L 24  ALT 0 - 44 U/L 13   CBC Latest Ref Rng & Units 10/15/2019  WBC 4.0 - 10.5 K/uL 8.8  Hemoglobin 13.0 - 17.0 g/dL 12.6(L)  Hematocrit 39 - 52 % 38.3(L)  Platelets 150 - 400 K/uL 165    No images are attached to the encounter.  CT CHEST ABDOMEN PELVIS W CONTRAST  Result Date: 09/21/2019 CLINICAL DATA:  History of melanoma EXAM: CT CHEST, ABDOMEN, AND PELVIS WITH CONTRAST TECHNIQUE: Multidetector CT imaging of the chest, abdomen and pelvis was performed following the standard protocol during bolus administration of intravenous contrast.  CONTRAST:  65m OMNIPAQUE IOHEXOL 300 MG/ML  SOLN COMPARISON:  May 28, 2016 FINDINGS: CT CHEST FINDINGS Cardiovascular: Calcified atheromatous plaque of the thoracic aorta without aneurysmal dilation. RIGHT-sided Port-A-Cath terminates at the caval to atrial junction. Heart size is stable without pericardial effusion. LEFT atrial and RIGHT heart enlargement is similar to the prior exam. Central pulmonary vasculature with normal appearance on venous phase assessment. Mediastinum/Nodes: Thoracic inlet structures are normal. No axillary lymphadenopathy. No mediastinal adenopathy.  Esophagus grossly normal. Lungs/Pleura: Elevation of the RIGHT hemidiaphragm with juxta diaphragmatic atelectatic changes unchanged. Diminished size of metastatic focus in the RIGHT lung (image 51, series 4) 8 x 5 mm previously 15 x 11 mm. Small medial RIGHT apical nodule seen in the medial RIGHT lung apex previously has nearly completely resolved now measuring approximately 2 mm previously approximately 6 5-6 mm (image 26, series 4) no consolidation. No pleural effusion. No new suspicious pulmonary nodule. Airways are patent. Marked interval improvement of a nodule at the RIGHT lung base medially (image 90 of series 4) 11 x 8 mm previously 22 x 17 mm. A 20 mm nodule at the RIGHT lung base seen on the prior study has since resolved. Musculoskeletal: See below for full musculoskeletal details. CT ABDOMEN PELVIS FINDINGS Hepatobiliary: Multiple hepatic lesions in the RIGHT hepatic lobe considerably decreased in size. Dominant lesion in the RIGHT hemi liver on image 54 of series 2 measuring 13 mm greatest axial dimension previously 33 mm. Medial segment LEFT lobe and anterior division RIGHT lobe atrophy with no change. Second lesion in the RIGHT hemi liver at the periphery measuring approximately 16 mm greatest axial dimension previously 25 mm with similar decrease in size of other hepatic lesions, no new hepatic abnormality. No pericholecystic  stranding. No biliary duct dilation. Pancreas: Pancreas is normal without ductal dilation or sign of inflammation. Spleen: Spleen normal in size and contour. Adrenals/Urinary Tract: Adrenal glands are normal. Symmetric renal enhancement. Small cyst in the RIGHT kidney interpolar aspect unchanged. No hydronephrosis. Urinary bladder under distended. Stomach/Bowel: No acute gastrointestinal process. Normal appendix. The colon beneath the RIGHT hemidiaphragm with marked RIGHT hemidiaphragmatic elevation as before. Colonic diverticulosis without signs of diverticulitis. Vascular/Lymphatic: Calcified atheromatous plaque and noncalcified plaque in the abdominal aorta. There is no gastrohepatic  or hepatoduodenal ligament lymphadenopathy. No retroperitoneal or mesenteric lymphadenopathy. No pelvic sidewall lymphadenopathy. Reproductive: Marked prostate heterogeneity which is similar to the previous study, nonspecific finding on CT. Other: No ascites.  No abdominal wall hernia. Musculoskeletal: Loss of height at the L4 vertebral level due to compression fracture, now approximately 40-50% loss of height. Similar appearance of compression fracture at L3. Mild loss of height, continued loss of height at L2 approximately 30% as compared to 10-20% loss of height at this level. T12 and T9 compression fractures with similar appearance as well. No destructive bone process. Spinal degenerative changes. IMPRESSION: 1. Improvement with respect to pulmonary and hepatic metastatic disease as described. No new signs of metastatic disease in the chest, abdomen or pelvis. 2. Worsening of L2 compression fracture with new L4 compression fracture since the prior exam. Correlate with any symptoms of back pain. 3. Less pronounced thickening though still with some irregularity and low attenuation of the gastroesophageal junction. Attention on follow-up or dedicated esophageal evaluation if not yet performed. 4. prostate heterogeneity which is  similar to the previous study, nonspecific finding on CT. 5. Colonic diverticulosis without signs of diverticulitis. 6. Aortic atherosclerosis. These results will be called to the ordering clinician or representative by the Radiologist Assistant, and communication documented in the PACS or [employer]. Aortic Atherosclerosis (ICD10-I70.0). Electronically Signed   By: Zetta Bills M.D.   On: 09/21/2019 13:32     Assessment and plan- Patient is a 86 y.o. 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 He is here for on treatment assessment prior to cycle 6 of palliative Keytruda  Counts okay to proceed with cycle 6 of palliative Keytruda today.  I will see him back in 3 weeks with CBC with differential, CMP for cycle 7.  So far he is tolerating Keytruda well without any significant side effects.  Recent scans also showed good response to treatment.  Back pain: Nonmalignant secondary to chronic compression fractures which is currently mild and well controlled with Tylenol.  I am therefore holding off on orthopedic referral at this time   Visit Diagnosis 1. Encounter for antineoplastic immunotherapy   2. Metastatic melanoma ([place])      Dr. Randa Evens, MD, MPH [facility] [phone] 10/15/2019 3:40 PM

## 2019-11-04 ENCOUNTER — Other Ambulatory Visit: Payer: Self-pay | Admitting: *Deleted

## 2019-11-04 DIAGNOSIS — C799 Secondary malignant neoplasm of unspecified site: Secondary | ICD-10-CM

## 2019-11-04 DIAGNOSIS — C439 Malignant melanoma of skin, unspecified: Secondary | ICD-10-CM

## 2019-11-05 ENCOUNTER — Encounter: Payer: Self-pay | Admitting: Oncology

## 2019-11-05 ENCOUNTER — Inpatient Hospital Stay: Payer: Medicare Other

## 2019-11-05 ENCOUNTER — Other Ambulatory Visit: Payer: Self-pay

## 2019-11-05 ENCOUNTER — Inpatient Hospital Stay (HOSPITAL_BASED_OUTPATIENT_CLINIC_OR_DEPARTMENT_OTHER): Payer: Medicare Other | Admitting: Oncology

## 2019-11-05 VITALS — BP 115/64 | HR 62 | Temp 97.4°F | Resp 16 | Ht 69.0 in | Wt 169.0 lb

## 2019-11-05 DIAGNOSIS — Z5112 Encounter for antineoplastic immunotherapy: Secondary | ICD-10-CM | POA: Diagnosis not present

## 2019-11-05 DIAGNOSIS — C799 Secondary malignant neoplasm of unspecified site: Secondary | ICD-10-CM | POA: Diagnosis not present

## 2019-11-05 DIAGNOSIS — C439 Malignant melanoma of skin, unspecified: Secondary | ICD-10-CM | POA: Diagnosis not present

## 2019-11-05 LAB — CBC WITH DIFFERENTIAL/PLATELET
Abs Immature Granulocytes: 0.02 10*3/uL (ref 0.00–0.07)
Basophils Absolute: 0 10*3/uL (ref 0.0–0.1)
Basophils Relative: 1 %
Eosinophils Absolute: 0.3 10*3/uL (ref 0.0–0.5)
Eosinophils Relative: 4 %
HCT: 41.8 % (ref 39.0–52.0)
Hemoglobin: 13.6 g/dL (ref 13.0–17.0)
Immature Granulocytes: 0 %
Lymphocytes Relative: 38 %
Lymphs Abs: 2.7 10*3/uL (ref 0.7–4.0)
MCH: 30.2 pg (ref 26.0–34.0)
MCHC: 32.5 g/dL (ref 30.0–36.0)
MCV: 92.9 fL (ref 80.0–100.0)
Monocytes Absolute: 0.4 10*3/uL (ref 0.1–1.0)
Monocytes Relative: 6 %
Neutro Abs: 3.6 10*3/uL (ref 1.7–7.7)
Neutrophils Relative %: 51 %
Platelets: 181 10*3/uL (ref 150–400)
RBC: 4.5 MIL/uL (ref 4.22–5.81)
RDW: 11.5 % (ref 11.5–15.5)
WBC: 7.1 10*3/uL (ref 4.0–10.5)
nRBC: 0 % (ref 0.0–0.2)

## 2019-11-05 LAB — COMPREHENSIVE METABOLIC PANEL
ALT: 14 U/L (ref 0–44)
AST: 25 U/L (ref 15–41)
Albumin: 3.8 g/dL (ref 3.5–5.0)
Alkaline Phosphatase: 108 U/L (ref 38–126)
Anion gap: 8 (ref 5–15)
BUN: 25 mg/dL — ABNORMAL HIGH (ref 8–23)
CO2: 23 mmol/L (ref 22–32)
Calcium: 8.7 mg/dL — ABNORMAL LOW (ref 8.9–10.3)
Chloride: 107 mmol/L (ref 98–111)
Creatinine, Ser: 1.4 mg/dL — ABNORMAL HIGH (ref 0.61–1.24)
GFR, Estimated: 47 mL/min — ABNORMAL LOW (ref >60)
Glucose, Bld: 117 mg/dL — ABNORMAL HIGH (ref 70–99)
Potassium: 3.9 mmol/L (ref 3.5–5.1)
Sodium: 138 mmol/L (ref 135–145)
Total Bilirubin: 0.7 mg/dL (ref 0.3–1.2)
Total Protein: 6.7 g/dL (ref 6.5–8.1)

## 2019-11-05 LAB — TSH: TSH: 2.433 u[IU]/mL (ref 0.350–4.500)

## 2019-11-05 MED ORDER — SODIUM CHLORIDE 0.9% FLUSH
10.0000 mL | Freq: Once | INTRAVENOUS | Status: AC
  Administered 2019-11-05: 10 mL via INTRAVENOUS
  Filled 2019-11-05: qty 10

## 2019-11-05 MED ORDER — SODIUM CHLORIDE 0.9 % IV SOLN
200.0000 mg | Freq: Once | INTRAVENOUS | Status: AC
  Administered 2019-11-05: 200 mg via INTRAVENOUS
  Filled 2019-11-05: qty 8

## 2019-11-05 MED ORDER — HEPARIN SOD (PORK) LOCK FLUSH 100 UNIT/ML IV SOLN
500.0000 [IU] | Freq: Once | INTRAVENOUS | Status: DC | PRN
  Filled 2019-11-05: qty 5

## 2019-11-05 MED ORDER — SODIUM CHLORIDE 0.9 % IV SOLN
Freq: Once | INTRAVENOUS | Status: AC
  Filled 2019-11-05: qty 250

## 2019-11-05 MED ORDER — HEPARIN SOD (PORK) LOCK FLUSH 100 UNIT/ML IV SOLN
INTRAVENOUS | Status: AC
  Filled 2019-11-05: qty 5

## 2019-11-05 MED ORDER — HEPARIN SOD (PORK) LOCK FLUSH 100 UNIT/ML IV SOLN
500.0000 [IU] | Freq: Once | INTRAVENOUS | Status: AC
  Administered 2019-11-05: 500 [IU] via INTRAVENOUS
  Filled 2019-11-05: qty 5

## 2019-11-05 NOTE — Progress Notes (Signed)
No concerns, eating and drinking and having good BM and no pain

## 2019-11-06 NOTE — Progress Notes (Signed)
Hematology/Oncology Consult note [facility]  Telephone:([phone] Fax:([phone]  Patient Care Team: [place] Pink, MD as PCP - General (Family Medicine)   Name of the patient: Omar Schroeder  [id number]  1924/08/03   Date of visit: 11/06/19  Diagnosis- stage IV metastatic malignant melanoma with lung and liver metastases  Chief complaint/ Reason for visit-on treatment assessment prior to cycle 7 of palliative Keytruda  Heme/Onc history: Patient is an [age] year old male who lives alone and is independent of his ADLs. He has no significant comorbidities other than hyperlipidemia and GERD. He has a history of superficial skin cancer. He presented to the ER with symptoms of right-sided flank pain and had a CT renal stone study which showed a 2.1 x 1.1 cm right lower lobe pulmonary nodule. Also noted to have multiple new ill-defined lesions throughout the right lobe of the liver largest measuring 3.4 x 2.8 cm. Spleen and pancreas was unremarkable. Low-attenuation lesion in the right kidney 2.6 x 1.2 cm likely representing a cyst. No intra-abdominal adenopathy. 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  Single agent Beryle Flock started in June 2021  Interval history-is doing well overall and tolerating immunotherapy without any significant side effects.  Denies any shortness of breath diarrhea or skin rash.  Back pain is stable  ECOG PS- 1 Pain scale- 0   Review of systems- Review of Systems    Constitutional: Negative for chills, fever, malaise/fatigue and weight loss.  HENT: Negative for congestion, ear discharge and nosebleeds.   Eyes: Negative for blurred vision.  Respiratory: Negative for cough, hemoptysis, sputum production, shortness of breath and wheezing.   Cardiovascular: Negative for chest pain, palpitations, orthopnea and claudication.  Gastrointestinal: Negative for abdominal pain, blood in stool, constipation, diarrhea, heartburn, melena, nausea and vomiting.  Genitourinary: Negative for dysuria, flank pain, frequency, hematuria and urgency.  Musculoskeletal: Negative for back pain, joint pain and myalgias.  Skin: Negative for rash.  Neurological: Negative for dizziness, tingling, focal weakness, seizures, weakness and headaches.  Endo/Heme/Allergies: Does not bruise/bleed easily.  Psychiatric/Behavioral: Negative for depression and suicidal ideas. The patient does not have insomnia.       No Known Allergies   Past Medical History:  Diagnosis Date  . Cancer ([place])    skin melanoma     Past Surgical History:  Procedure Laterality Date  . CATARACT EXTRACTION Bilateral 2000  . HERNIA REPAIR    . MELANOMA EXCISION    . PORTA CATH INSERTION N/A 06/28/2019   Procedure: PORTA CATH INSERTION;  Surgeon: Algernon Huxley, MD;  Location: [facility];  Service: Cardiovascular;  Laterality: N/A;    Social History   Socioeconomic History  . Marital status: Widowed    Spouse name: Not on file  . Number of children: Not on file  . Years of education: Not on file  . Highest education level: Not on file  Occupational History  . Not on file  Tobacco Use  . Smoking status: Never Smoker  . Smokeless tobacco: Never Used  Vaping Use  . Vaping Use: Never used  Substance and Sexual Activity  . Alcohol use: Never  . Drug use: Not Currently  . Sexual activity: Not Currently  Other Topics Concern  . Not on file  Social History Narrative   Lives at home by  himself    Social Determinants of Health   Financial Resource Strain:   . Difficulty of Paying Living Expenses: Not on file  Food Insecurity:   . Worried About Charity fundraiser in the Last Year: Not on file  . Ran Out of Food in the Last Year: Not on file  Transportation Needs:   . Lack of Transportation (Medical): Not on file  . Lack of Transportation (Non-Medical): Not on file  Physical Activity:   . Days of Exercise per Week: Not on file  . Minutes of Exercise per Session: Not on file  Stress:   . Feeling of Stress : Not on file  Social Connections:   . Frequency of Communication with Friends and Family: Not on file  . Frequency of Social Gatherings with Friends and Family: Not on file  . Attends Religious Services: Not on file  . Active Member of Clubs or Organizations: Not on file  . Attends Archivist Meetings: Not on file  . Marital Status: Not on file  Intimate Partner Violence:   . Fear of Current or Ex-Partner: Not on file  . Emotionally Abused: Not on file  . Physically Abused: Not on file  . Sexually Abused: Not on file    History reviewed. No pertinent family history.   Current Outpatient Medications:  .  acetaminophen (TYLENOL) 325 MG tablet, Take 650 mg by mouth every 6 (six) hours as needed. , Disp: , Rfl:  .  Loratadine 10 MG CAPS, Take 1 capsule by mouth daily as needed. , Disp: , Rfl:  .  ondansetron (ZOFRAN) 8 MG tablet, Take 1 tablet (8 mg total) by mouth 2 (two) times daily as needed (Nausea or vomiting)., Disp: 30 tablet, Rfl: 1 .  lidocaine-prilocaine (EMLA) cream, Apply to affected area once (Patient not taking: Reported on 07/23/2019), Disp: 30 g, Rfl: 3 .  prochlorperazine (COMPAZINE) 10 MG tablet, Take 1 tablet (10 mg total) by mouth every 6 (six) hours as needed (Nausea or vomiting). (Patient not taking: Reported on 06/28/2019), Disp: 30 tablet, Rfl: 1 .  traMADol (ULTRAM) 50 MG tablet, TAKE 1 TABLET (50 MG TOTAL) BY MOUTH EVERY 6 (SIX)  HOURS AS NEEDED. (Patient not taking: Reported on 09/24/2019), Disp: 60 tablet, Rfl: 0  Physical exam:  Vitals:   11/05/19 0913  BP: 115/64  Pulse: 62  Resp: 16  Temp: (!) 97.4 F (36.3 C)  TempSrc: Tympanic  Weight: 169 lb (76.7 kg)  Height: '5\' 9"'  (1.753 m)   Physical Exam Constitutional:      General: He is not in acute distress. Cardiovascular:     Rate and Rhythm: Normal rate and regular rhythm.     Heart sounds: Normal heart sounds.  Pulmonary:     Effort: Pulmonary effort is normal.     Breath sounds: Normal breath sounds.  Abdominal:     General: Bowel sounds are normal.     Palpations: Abdomen is soft.  Skin:    General: Skin is warm and dry.  Neurological:     Mental Status: He is alert and oriented to person, place, and time.      CMP Latest Ref Rng & Units 11/05/2019  Glucose 70 - 99 mg/dL 117(H)  BUN 8 -  23 mg/dL 25(H)  Creatinine 0.61 - 1.24 mg/dL 1.40(H)  Sodium 135 - 145 mmol/L 138  Potassium 3.5 - 5.1 mmol/L 3.9  Chloride 98 - 111 mmol/L 107  CO2 22 - 32 mmol/L 23  Calcium 8.9 - 10.3 mg/dL 8.7(L)  Total Protein 6.5 - 8.1 g/dL 6.7  Total Bilirubin 0.3 - 1.2 mg/dL 0.7  Alkaline Phos 38 - 126 U/L 108  AST 15 - 41 U/L 25  ALT 0 - 44 U/L 14   CBC Latest Ref Rng & Units 11/05/2019  WBC 4.0 - 10.5 K/uL 7.1  Hemoglobin 13.0 - 17.0 g/dL 13.6  Hematocrit 39 - 52 % 41.8  Platelets 150 - 400 K/uL 181    Assessment and plan- Patient is a 86 y.o. male with stage IV metastatic malignant melanoma with liver and lung metastases.He is here for on treatment assessment prior to cycle 7 of palliative Keytruda  Counts okay to proceed with cycle 7 of palliative Keytruda today.  I will see him back in 3 weeks with CBC with differential, CMP for cycle 8.  TSH from today is normal as well.  He would be due for repeat scans inDecember 2021.  So far he is tolerating immunotherapy well without any significant side effects.  Visit Diagnosis 1. Encounter for  antineoplastic immunotherapy   2. Metastatic melanoma ([place])      Dr. Randa Evens, MD, MPH [facility] [phone] 11/06/2019 1:13 PM

## 2019-11-26 ENCOUNTER — Inpatient Hospital Stay (HOSPITAL_BASED_OUTPATIENT_CLINIC_OR_DEPARTMENT_OTHER): Payer: Medicare Other | Admitting: Oncology

## 2019-11-26 ENCOUNTER — Encounter: Payer: Self-pay | Admitting: Oncology

## 2019-11-26 ENCOUNTER — Inpatient Hospital Stay: Payer: Medicare Other

## 2019-11-26 ENCOUNTER — Inpatient Hospital Stay: Payer: Medicare Other | Attending: Oncology

## 2019-11-26 VITALS — BP 141/55 | HR 65 | Temp 96.6°F | Resp 16 | Wt 169.7 lb

## 2019-11-26 DIAGNOSIS — Z79899 Other long term (current) drug therapy: Secondary | ICD-10-CM | POA: Insufficient documentation

## 2019-11-26 DIAGNOSIS — C7800 Secondary malignant neoplasm of unspecified lung: Secondary | ICD-10-CM | POA: Diagnosis not present

## 2019-11-26 DIAGNOSIS — C787 Secondary malignant neoplasm of liver and intrahepatic bile duct: Secondary | ICD-10-CM | POA: Insufficient documentation

## 2019-11-26 DIAGNOSIS — K219 Gastro-esophageal reflux disease without esophagitis: Secondary | ICD-10-CM | POA: Insufficient documentation

## 2019-11-26 DIAGNOSIS — E785 Hyperlipidemia, unspecified: Secondary | ICD-10-CM | POA: Diagnosis not present

## 2019-11-26 DIAGNOSIS — C439 Malignant melanoma of skin, unspecified: Secondary | ICD-10-CM | POA: Diagnosis not present

## 2019-11-26 DIAGNOSIS — C799 Secondary malignant neoplasm of unspecified site: Secondary | ICD-10-CM

## 2019-11-26 DIAGNOSIS — Z5112 Encounter for antineoplastic immunotherapy: Secondary | ICD-10-CM | POA: Insufficient documentation

## 2019-11-26 DIAGNOSIS — N189 Chronic kidney disease, unspecified: Secondary | ICD-10-CM | POA: Diagnosis not present

## 2019-11-26 LAB — COMPREHENSIVE METABOLIC PANEL
ALT: 13 U/L (ref 0–44)
AST: 23 U/L (ref 15–41)
Albumin: 3.8 g/dL (ref 3.5–5.0)
Alkaline Phosphatase: 96 U/L (ref 38–126)
Anion gap: 7 (ref 5–15)
BUN: 21 mg/dL (ref 8–23)
CO2: 24 mmol/L (ref 22–32)
Calcium: 8.9 mg/dL (ref 8.9–10.3)
Chloride: 107 mmol/L (ref 98–111)
Creatinine, Ser: 1.61 mg/dL — ABNORMAL HIGH (ref 0.61–1.24)
GFR, Estimated: 40 mL/min — ABNORMAL LOW (ref >60)
Glucose, Bld: 111 mg/dL — ABNORMAL HIGH (ref 70–99)
Potassium: 4.1 mmol/L (ref 3.5–5.1)
Sodium: 138 mmol/L (ref 135–145)
Total Bilirubin: 0.7 mg/dL (ref 0.3–1.2)
Total Protein: 6.7 g/dL (ref 6.5–8.1)

## 2019-11-26 LAB — CBC WITH DIFFERENTIAL/PLATELET
Abs Immature Granulocytes: 0.02 10*3/uL (ref 0.00–0.07)
Basophils Absolute: 0 10*3/uL (ref 0.0–0.1)
Basophils Relative: 1 %
Eosinophils Absolute: 0.3 10*3/uL (ref 0.0–0.5)
Eosinophils Relative: 5 %
HCT: 41.9 % (ref 39.0–52.0)
Hemoglobin: 13.6 g/dL (ref 13.0–17.0)
Immature Granulocytes: 0 %
Lymphocytes Relative: 32 %
Lymphs Abs: 2.2 10*3/uL (ref 0.7–4.0)
MCH: 30.1 pg (ref 26.0–34.0)
MCHC: 32.5 g/dL (ref 30.0–36.0)
MCV: 92.7 fL (ref 80.0–100.0)
Monocytes Absolute: 0.5 10*3/uL (ref 0.1–1.0)
Monocytes Relative: 7 %
Neutro Abs: 3.8 10*3/uL (ref 1.7–7.7)
Neutrophils Relative %: 55 %
Platelets: 182 10*3/uL (ref 150–400)
RBC: 4.52 MIL/uL (ref 4.22–5.81)
RDW: 11.9 % (ref 11.5–15.5)
WBC: 6.8 10*3/uL (ref 4.0–10.5)
nRBC: 0 % (ref 0.0–0.2)

## 2019-11-26 MED ORDER — SODIUM CHLORIDE 0.9 % IV SOLN
200.0000 mg | Freq: Once | INTRAVENOUS | Status: AC
  Administered 2019-11-26: 200 mg via INTRAVENOUS
  Filled 2019-11-26: qty 8

## 2019-11-26 MED ORDER — SODIUM CHLORIDE 0.9% FLUSH
10.0000 mL | Freq: Once | INTRAVENOUS | Status: AC
  Administered 2019-11-26: 10 mL via INTRAVENOUS
  Filled 2019-11-26: qty 10

## 2019-11-26 MED ORDER — HEPARIN SOD (PORK) LOCK FLUSH 100 UNIT/ML IV SOLN
500.0000 [IU] | Freq: Once | INTRAVENOUS | Status: AC | PRN
  Administered 2019-11-26: 500 [IU]
  Filled 2019-11-26: qty 5

## 2019-11-26 MED ORDER — SODIUM CHLORIDE 0.9 % IV SOLN
Freq: Once | INTRAVENOUS | Status: AC
  Filled 2019-11-26: qty 250

## 2019-11-26 NOTE — Progress Notes (Signed)
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 He has no significant comorbidities other than hyperlipidemia and GERD. He has a history of superficial skin cancer. He presented to the ER with symptoms of right-sided flank pain and had a CT renal stone study which showed a 2.1 x 1.1 cm right lower lobe pulmonary nodule. Also noted to have multiple new ill-defined lesions throughout the right lobe of the liver largest measuring 3.4 x 2.8 cm. Spleen and pancreas was unremarkable. Low-attenuation lesion in the right kidney 2.6 x 1.2 cm likely representing a cyst. No intra-abdominal adenopathy. 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  Single agent Beryle Flock started in June 2021  Interval history-patient is doing well and denies any complaints at this time.  Denies any significant back pain chest pain shortness of breath or episodes of syncope.  ECOG PS- 1 Pain scale- 0  Review of systems- Review of Systems  Constitutional: Negative  for chills, fever, malaise/fatigue and weight loss.  HENT: Negative for congestion, ear discharge and nosebleeds.   Eyes: Negative for blurred vision.  Respiratory: Negative for cough, hemoptysis, sputum production, shortness of breath and wheezing.   Cardiovascular: Negative for chest pain, palpitations, orthopnea and claudication.  Gastrointestinal: Negative for abdominal pain, blood in stool, constipation, diarrhea, heartburn, melena, nausea and vomiting.  Genitourinary: Negative for dysuria, flank pain, frequency, hematuria and urgency.  Musculoskeletal: Negative for back pain, joint pain and myalgias.  Skin: Negative for rash.  Neurological: Negative for dizziness, tingling, focal weakness, seizures, weakness and headaches.  Endo/Heme/Allergies: Does not bruise/bleed easily.  Psychiatric/Behavioral: Negative for depression and suicidal ideas. The patient does not have insomnia.       No Known Allergies   Past Medical History:  Diagnosis Date  . Cancer ([place])    skin melanoma     Past Surgical History:  Procedure Laterality Date  . CATARACT EXTRACTION Bilateral 2000  . HERNIA REPAIR    . MELANOMA EXCISION    . PORTA CATH INSERTION N/A 06/28/2019   Procedure: PORTA CATH INSERTION;  Surgeon: Algernon Huxley, MD;  Location: [place] [facility];  Service: Cardiovascular;  Laterality: N/A;    Social History   Socioeconomic History  . Marital status: Widowed    Spouse name: Not on file  . Number of children: Not on file  . Years of education: Not on file  . Highest education level: Not on file  Occupational History  . Not on file  Tobacco Use  . Smoking status: Never Smoker  . Smokeless tobacco: Never Used  Vaping Use  . Vaping Use: Never used  Substance and  Sexual Activity  . Alcohol use: Never  . Drug use: Not Currently  . Sexual activity: Not Currently  Other Topics Concern  . Not on file  Social History Narrative   Lives at home by himself    Social  Determinants of Health   Financial Resource Strain:   . Difficulty of Paying Living Expenses: Not on file  Food Insecurity:   . Worried About Charity fundraiser in the Last Year: Not on file  . Ran Out of Food in the Last Year: Not on file  Transportation Needs:   . Lack of Transportation (Medical): Not on file  . Lack of Transportation (Non-Medical): Not on file  Physical Activity:   . Days of Exercise per Week: Not on file  . Minutes of Exercise per Session: Not on file  Stress:   . Feeling of Stress : Not on file  Social Connections:   . Frequency of Communication with Friends and Family: Not on file  . Frequency of Social Gatherings with Friends and Family: Not on file  . Attends Religious Services: Not on file  . Active Member of Clubs or Organizations: Not on file  . Attends Archivist Meetings: Not on file  . Marital Status: Not on file  Intimate Partner Violence:   . Fear of Current or Ex-Partner: Not on file  . Emotionally Abused: Not on file  . Physically Abused: Not on file  . Sexually Abused: Not on file    No family history on file.   Current Outpatient Medications:  .  acetaminophen (TYLENOL) 325 MG tablet, Take 650 mg by mouth every 6 (six) hours as needed. , Disp: , Rfl:  .  lidocaine-prilocaine (EMLA) cream, Apply to affected area once (Patient not taking: Reported on 07/23/2019), Disp: 30 g, Rfl: 3 .  Loratadine 10 MG CAPS, Take 1 capsule by mouth daily as needed. , Disp: , Rfl:  .  ondansetron (ZOFRAN) 8 MG tablet, Take 1 tablet (8 mg total) by mouth 2 (two) times daily as needed (Nausea or vomiting)., Disp: 30 tablet, Rfl: 1 .  prochlorperazine (COMPAZINE) 10 MG tablet, Take 1 tablet (10 mg total) by mouth every 6 (six) hours as needed (Nausea or vomiting). (Patient not taking: Reported on 06/28/2019), Disp: 30 tablet, Rfl: 1 .  traMADol (ULTRAM) 50 MG tablet, TAKE 1 TABLET (50 MG TOTAL) BY MOUTH EVERY 6 (SIX) HOURS AS NEEDED. (Patient not taking:  Reported on 09/24/2019), Disp: 60 tablet, Rfl: 0  Physical exam:  Vitals:   11/26/19 0929 11/26/19 0953  BP: (!) 141/55   Pulse: (!) 38 65  Resp: 16   Temp: (!) 96.6 F (35.9 C)   TempSrc: Tympanic   SpO2: 97%   Weight: 169 lb 11.2 oz (77 kg)    Physical Exam HENT:     Head: Normocephalic and atraumatic.  Eyes:     Pupils: Pupils are equal, round, and reactive to light.  Cardiovascular:     Rate and Rhythm: Normal rate and regular rhythm.     Heart sounds: Normal heart sounds.  Pulmonary:     Effort: Pulmonary effort is normal.     Breath sounds: Normal breath sounds.  Abdominal:     General: Bowel sounds are normal.     Palpations: Abdomen is soft.  Musculoskeletal:     Cervical back: Normal range of motion.  Skin:    General: Skin is warm and dry.  Neurological:     Mental Status: He  is alert and oriented to person, place, and time.      CMP Latest Ref Rng & Units 11/26/2019  Glucose 70 - 99 mg/dL 111(H)  BUN 8 - 23 mg/dL 21  Creatinine 0.61 - 1.24 mg/dL 1.61(H)  Sodium 135 - 145 mmol/L 138  Potassium 3.5 - 5.1 mmol/L 4.1  Chloride 98 - 111 mmol/L 107  CO2 22 - 32 mmol/L 24  Calcium 8.9 - 10.3 mg/dL 8.9  Total Protein 6.5 - 8.1 g/dL 6.7  Total Bilirubin 0.3 - 1.2 mg/dL 0.7  Alkaline Phos 38 - 126 U/L 96  AST 15 - 41 U/L 23  ALT 0 - 44 U/L 13   CBC Latest Ref Rng & Units 11/26/2019  WBC 4.0 - 10.5 K/uL 6.8  Hemoglobin 13.0 - 17.0 g/dL 13.6  Hematocrit 39 - 52 % 41.9  Platelets 150 - 400 K/uL 182      Assessment and plan- Patient is a 86 y.o. male  with stage IV metastatic malignant melanoma with liver and lung metastases.He is here for on treatment assessment prior to cycle 8 of palliative Keytruda  Counseled to proceed with cycle 8 of palliative Keytruda today.  I will see him back in 3 weeks for cycle 9.  He has CKD with a creatinine that has been stable between 1.4-1.7.  We have been monitoring his TSH as well which is remained  normal.  Bradycardia: On repeat check patient's pulse rate both manually as well as on pulse ox was 65/min.  Blood pressure is normal.  Patient does not report any new complaints.  No new medications.  Continue to monitor   Visit Diagnosis 1. Encounter for antineoplastic immunotherapy   2. Metastatic melanoma ([place])      Dr. Randa Evens, MD, MPH [facility] [phone] 11/26/2019 9:57 AM

## 2019-11-26 NOTE — Progress Notes (Signed)
Pt doing well and no c/o. Heart rate 34-37 and no symptoms from the low heart rate. Checked it on pulse ox monitor as well as took it twice manually

## 2019-11-26 NOTE — Progress Notes (Signed)
Creatinine 1.61, Per Dr. Janese Banks okay to proceed with [facility] treatment as scheduled.    1131: pt tolerated infusion well. No s/s of distress or reaction noted. Pt stable at discharge.

## 2019-12-17 ENCOUNTER — Inpatient Hospital Stay (HOSPITAL_BASED_OUTPATIENT_CLINIC_OR_DEPARTMENT_OTHER): Payer: Medicare Other | Admitting: Oncology

## 2019-12-17 ENCOUNTER — Other Ambulatory Visit: Payer: Self-pay

## 2019-12-17 ENCOUNTER — Inpatient Hospital Stay: Payer: Medicare Other | Attending: Oncology

## 2019-12-17 ENCOUNTER — Inpatient Hospital Stay: Payer: Medicare Other

## 2019-12-17 VITALS — BP 107/56 | HR 61 | Temp 98.0°F | Resp 16 | Ht 69.0 in | Wt 173.9 lb

## 2019-12-17 DIAGNOSIS — Z5112 Encounter for antineoplastic immunotherapy: Secondary | ICD-10-CM | POA: Diagnosis not present

## 2019-12-17 DIAGNOSIS — I700 Atherosclerosis of aorta: Secondary | ICD-10-CM | POA: Insufficient documentation

## 2019-12-17 DIAGNOSIS — E785 Hyperlipidemia, unspecified: Secondary | ICD-10-CM | POA: Insufficient documentation

## 2019-12-17 DIAGNOSIS — C7800 Secondary malignant neoplasm of unspecified lung: Secondary | ICD-10-CM | POA: Diagnosis not present

## 2019-12-17 DIAGNOSIS — Z79899 Other long term (current) drug therapy: Secondary | ICD-10-CM | POA: Insufficient documentation

## 2019-12-17 DIAGNOSIS — K219 Gastro-esophageal reflux disease without esophagitis: Secondary | ICD-10-CM | POA: Insufficient documentation

## 2019-12-17 DIAGNOSIS — C787 Secondary malignant neoplasm of liver and intrahepatic bile duct: Secondary | ICD-10-CM | POA: Insufficient documentation

## 2019-12-17 DIAGNOSIS — N400 Enlarged prostate without lower urinary tract symptoms: Secondary | ICD-10-CM | POA: Diagnosis not present

## 2019-12-17 DIAGNOSIS — C439 Malignant melanoma of skin, unspecified: Secondary | ICD-10-CM

## 2019-12-17 DIAGNOSIS — C799 Secondary malignant neoplasm of unspecified site: Secondary | ICD-10-CM

## 2019-12-17 DIAGNOSIS — N189 Chronic kidney disease, unspecified: Secondary | ICD-10-CM | POA: Diagnosis not present

## 2019-12-17 LAB — CBC WITH DIFFERENTIAL/PLATELET
Abs Immature Granulocytes: 0.01 10*3/uL (ref 0.00–0.07)
Basophils Absolute: 0 10*3/uL (ref 0.0–0.1)
Basophils Relative: 1 %
Eosinophils Absolute: 0.3 10*3/uL (ref 0.0–0.5)
Eosinophils Relative: 5 %
HCT: 40.4 % (ref 39.0–52.0)
Hemoglobin: 13.3 g/dL (ref 13.0–17.0)
Immature Granulocytes: 0 %
Lymphocytes Relative: 33 %
Lymphs Abs: 2.2 10*3/uL (ref 0.7–4.0)
MCH: 30.5 pg (ref 26.0–34.0)
MCHC: 32.9 g/dL (ref 30.0–36.0)
MCV: 92.7 fL (ref 80.0–100.0)
Monocytes Absolute: 0.5 10*3/uL (ref 0.1–1.0)
Monocytes Relative: 7 %
Neutro Abs: 3.5 10*3/uL (ref 1.7–7.7)
Neutrophils Relative %: 54 %
Platelets: 171 10*3/uL (ref 150–400)
RBC: 4.36 MIL/uL (ref 4.22–5.81)
RDW: 12.3 % (ref 11.5–15.5)
WBC: 6.5 10*3/uL (ref 4.0–10.5)
nRBC: 0 % (ref 0.0–0.2)

## 2019-12-17 LAB — COMPREHENSIVE METABOLIC PANEL
ALT: 14 U/L (ref 0–44)
AST: 22 U/L (ref 15–41)
Albumin: 3.8 g/dL (ref 3.5–5.0)
Alkaline Phosphatase: 97 U/L (ref 38–126)
Anion gap: 8 (ref 5–15)
BUN: 26 mg/dL — ABNORMAL HIGH (ref 8–23)
CO2: 26 mmol/L (ref 22–32)
Calcium: 9 mg/dL (ref 8.9–10.3)
Chloride: 105 mmol/L (ref 98–111)
Creatinine, Ser: 1.42 mg/dL — ABNORMAL HIGH (ref 0.61–1.24)
GFR, Estimated: 46 mL/min — ABNORMAL LOW (ref >60)
Glucose, Bld: 102 mg/dL — ABNORMAL HIGH (ref 70–99)
Potassium: 4.2 mmol/L (ref 3.5–5.1)
Sodium: 139 mmol/L (ref 135–145)
Total Bilirubin: 0.7 mg/dL (ref 0.3–1.2)
Total Protein: 6.7 g/dL (ref 6.5–8.1)

## 2019-12-17 MED ORDER — SODIUM CHLORIDE 0.9 % IV SOLN
Freq: Once | INTRAVENOUS | Status: AC
  Filled 2019-12-17: qty 250

## 2019-12-17 MED ORDER — HEPARIN SOD (PORK) LOCK FLUSH 100 UNIT/ML IV SOLN
500.0000 [IU] | Freq: Once | INTRAVENOUS | Status: AC | PRN
  Administered 2019-12-17: 500 [IU]
  Filled 2019-12-17: qty 5

## 2019-12-17 MED ORDER — SODIUM CHLORIDE 0.9 % IV SOLN
200.0000 mg | Freq: Once | INTRAVENOUS | Status: AC
  Administered 2019-12-17: 200 mg via INTRAVENOUS
  Filled 2019-12-17: qty 8

## 2019-12-17 NOTE — Progress Notes (Signed)
Hematology/Oncology Consult note [facility]  [phone] [phone]  Patient Care Team: [place] Pink, MD as PCP - General (Family Medicine)   Name of the patient: Omar Schroeder  [id number]  [birth year]   Date of visit: 12/17/19  Diagnosis- stage IV metastatic malignant melanoma with lung and liver metastases  Chief complaint/ Reason for visit-on treatment assessment prior to cycle 9 of palliative Keytruda  Heme/Onc history: Patient is an [age] year old male who lives alone and is independent of his ADLs. He has no significant comorbidities other than hyperlipidemia and GERD. He has a history of superficial skin cancer. He presented to the ER with symptoms of right-sided flank pain and had a CT renal stone study which showed a 2.1 x 1.1 cm right lower lobe pulmonary nodule. Also noted to have multiple new ill-defined lesions throughout the right lobe of the liver largest measuring 3.4 x 2.8 cm. Spleen and pancreas was unremarkable. Low-attenuation lesion in the right kidney 2.6 x 1.2 cm likely representing a cyst. No intra-abdominal adenopathy. 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  Single agent Beryle Flock started in June 2021   Interval history- patient reports doing well and denies any complaints at this time.  No pain fever shortness of breath constipation or diarrhea  ECOG PS- 1 Pain scale- 0   Review of systems- Review of Systems  Constitutional: Negative for chills, fever,  malaise/fatigue and weight loss.  HENT: Negative for congestion, ear discharge and nosebleeds.   Eyes: Negative for blurred vision.  Respiratory: Negative for cough, hemoptysis, sputum production, shortness of breath and wheezing.   Cardiovascular: Negative for chest pain, palpitations, orthopnea and claudication.  Gastrointestinal: Negative for abdominal pain, blood in stool, constipation, diarrhea, heartburn, melena, nausea and vomiting.  Genitourinary: Negative for dysuria, flank pain, frequency, hematuria and urgency.  Musculoskeletal: Negative for back pain, joint pain and myalgias.  Skin: Negative for rash.  Neurological: Negative for dizziness, tingling, focal weakness, seizures, weakness and headaches.  Endo/Heme/Allergies: Does not bruise/bleed easily.  Psychiatric/Behavioral: Negative for depression and suicidal ideas. The patient does not have insomnia.        No Known Allergies   Past Medical History:  Diagnosis Date  . Cancer ([place])    skin melanoma     Past Surgical History:  Procedure Laterality Date  . CATARACT EXTRACTION Bilateral 2000  . HERNIA REPAIR    . MELANOMA EXCISION    . PORTA CATH INSERTION N/A 06/28/2019   Procedure: PORTA CATH INSERTION;  Surgeon: Algernon Huxley, MD;  Location: [place] [facility];  Service: Cardiovascular;  Laterality: N/A;    Social History   Socioeconomic History  . Marital status: Widowed    Spouse name: Not on file  . Number of children: Not on file  . Years of education: Not on file  . Highest education level: Not on file  Occupational History  . Not on file  Tobacco Use  . Smoking status: Never Smoker  . Smokeless tobacco: Never Used  Vaping Use  . Vaping Use: Never used  Substance and Sexual  Activity  . Alcohol use: Never  . Drug use: Not Currently  . Sexual activity: Not Currently  Other Topics Concern  . Not on file  Social History Narrative   Lives at home by himself    Social Determinants of Health    Financial Resource Strain:   . Difficulty of Paying Living Expenses: Not on file  Food Insecurity:   . Worried About Charity fundraiser in the Last Year: Not on file  . Ran Out of Food in the Last Year: Not on file  Transportation Needs:   . Lack of Transportation (Medical): Not on file  . Lack of Transportation (Non-Medical): Not on file  Physical Activity:   . Days of Exercise per Week: Not on file  . Minutes of Exercise per Session: Not on file  Stress:   . Feeling of Stress : Not on file  Social Connections:   . Frequency of Communication with Friends and Family: Not on file  . Frequency of Social Gatherings with Friends and Family: Not on file  . Attends Religious Services: Not on file  . Active Member of Clubs or Organizations: Not on file  . Attends Archivist Meetings: Not on file  . Marital Status: Not on file  Intimate Partner Violence:   . Fear of Current or Ex-Partner: Not on file  . Emotionally Abused: Not on file  . Physically Abused: Not on file  . Sexually Abused: Not on file    No family history on file.   Current Outpatient Medications:  .  acetaminophen (TYLENOL) 325 MG tablet, Take 650 mg by mouth every 6 (six) hours as needed. , Disp: , Rfl:  .  Loratadine 10 MG CAPS, Take 1 capsule by mouth daily as needed. , Disp: , Rfl:  .  lidocaine-prilocaine (EMLA) cream, Apply to affected area once (Patient not taking: Reported on 07/23/2019), Disp: 30 g, Rfl: 3 .  ondansetron (ZOFRAN) 8 MG tablet, Take 1 tablet (8 mg total) by mouth 2 (two) times daily as needed (Nausea or vomiting). (Patient not taking: Reported on 11/26/2019), Disp: 30 tablet, Rfl: 1 .  prochlorperazine (COMPAZINE) 10 MG tablet, Take 1 tablet (10 mg total) by mouth every 6 (six) hours as needed (Nausea or vomiting). (Patient not taking: Reported on 06/28/2019), Disp: 30 tablet, Rfl: 1 .  traMADol (ULTRAM) 50 MG tablet, TAKE 1 TABLET (50 MG TOTAL) BY MOUTH EVERY 6 (SIX) HOURS AS NEEDED.  (Patient not taking: Reported on 09/24/2019), Disp: 60 tablet, Rfl: 0  Physical exam:  Vitals:   12/17/19 0953  BP: (!) 107/56  Pulse: 61  Resp: 16  Temp: 98 F (36.7 C)  TempSrc: Oral  Weight: 173 lb 14.4 oz (78.9 kg)  Height: _0  (1.753 m)   Physical Exam Constitutional:      General: He is not in acute distress. Eyes:     Pupils: Pupils are equal, round, and reactive to light.  Cardiovascular:     Rate and Rhythm: Normal rate and regular rhythm.     Heart sounds: Normal heart sounds.  Pulmonary:     Effort: Pulmonary effort is normal.     Breath sounds: Normal breath sounds.  Abdominal:     General: Bowel sounds are normal.     Palpations: Abdomen is soft.  Skin:    General: Skin is warm and dry.  Neurological:     Mental Status: He is alert and oriented to person, place, and time.  CMP Latest Ref Rng & Units 12/17/2019  Glucose 70 - 99 mg/dL 102(H)  BUN 8 - 23 mg/dL 26(H)  Creatinine 0.61 - 1.24 mg/dL 1.42(H)  Sodium 135 - 145 mmol/L 139  Potassium 3.5 - 5.1 mmol/L 4.2  Chloride 98 - 111 mmol/L 105  CO2 22 - 32 mmol/L 26  Calcium 8.9 - 10.3 mg/dL 9.0  Total Protein 6.5 - 8.1 g/dL 6.7  Total Bilirubin 0.3 - 1.2 mg/dL 0.7  Alkaline Phos 38 - 126 U/L 97  AST 15 - 41 U/L 22  ALT 0 - 44 U/L 14   CBC Latest Ref Rng & Units 12/17/2019  WBC 4.0 - 10.5 K/uL 6.5  Hemoglobin 13.0 - 17.0 g/dL 13.3  Hematocrit 39 - 52 % 40.4  Platelets 150 - 400 K/uL 171      Assessment and plan- Patient is a 86 y.o. male with history of metastatic malignant melanoma BRAF negative with liver metastases here for on treatment assessment prior to next cycle of palliative Keytruda  Counts are okay to proceed with cycle 9 of palliative Keytruda today.  I will see him back in 3 weeks for cycle 10.  Plan to repeat CT chest abdomen and pelvis with contrast 2 weeks from now.   Visit Diagnosis 1. Metastatic melanoma ([place])   2. Encounter for antineoplastic immunotherapy      Dr.  Randa Evens, MD, MPH [facility] [phone] 12/17/2019 4:00 PM

## 2019-12-17 NOTE — Progress Notes (Signed)
Pt doing great, no pain in back. No concerns

## 2019-12-17 NOTE — Progress Notes (Signed)
Pt received keytruda infusion. Tolerated well. Ambulatory at discharge.

## 2019-12-31 ENCOUNTER — Other Ambulatory Visit: Payer: Self-pay

## 2019-12-31 ENCOUNTER — Ambulatory Visit
Admission: RE | Admit: 2019-12-31 | Discharge: 2019-12-31 | Disposition: A | Discharge status: Home / Self Care | Payer: Medicare Other | Source: Ambulatory Visit | Attending: Oncology | Admitting: Oncology

## 2019-12-31 DIAGNOSIS — C799 Secondary malignant neoplasm of unspecified site: Secondary | ICD-10-CM | POA: Diagnosis present

## 2019-12-31 DIAGNOSIS — N400 Enlarged prostate without lower urinary tract symptoms: Secondary | ICD-10-CM | POA: Diagnosis not present

## 2019-12-31 DIAGNOSIS — C439 Malignant melanoma of skin, unspecified: Secondary | ICD-10-CM

## 2019-12-31 DIAGNOSIS — M48061 Spinal stenosis, lumbar region without neurogenic claudication: Secondary | ICD-10-CM | POA: Diagnosis not present

## 2019-12-31 DIAGNOSIS — K5730 Diverticulosis of large intestine without perforation or abscess without bleeding: Secondary | ICD-10-CM | POA: Insufficient documentation

## 2019-12-31 DIAGNOSIS — I2510 Atherosclerotic heart disease of native coronary artery without angina pectoris: Secondary | ICD-10-CM | POA: Insufficient documentation

## 2019-12-31 DIAGNOSIS — I700 Atherosclerosis of aorta: Secondary | ICD-10-CM | POA: Insufficient documentation

## 2019-12-31 DIAGNOSIS — M47816 Spondylosis without myelopathy or radiculopathy, lumbar region: Secondary | ICD-10-CM | POA: Diagnosis not present

## 2019-12-31 DIAGNOSIS — R918 Other nonspecific abnormal finding of lung field: Secondary | ICD-10-CM | POA: Diagnosis not present

## 2019-12-31 HISTORY — DX: Malignant melanoma of skin, unspecified: C43.9

## 2019-12-31 MED ORDER — IOHEXOL 300 MG/ML  SOLN
75.0000 mL | Freq: Once | INTRAMUSCULAR | Status: AC | PRN
  Administered 2019-12-31: 13:00:00 75 mL via INTRAVENOUS

## 2020-01-06 ENCOUNTER — Other Ambulatory Visit: Payer: Self-pay | Admitting: *Deleted

## 2020-01-06 DIAGNOSIS — C439 Malignant melanoma of skin, unspecified: Secondary | ICD-10-CM

## 2020-01-07 ENCOUNTER — Inpatient Hospital Stay: Payer: Medicare Other

## 2020-01-07 ENCOUNTER — Inpatient Hospital Stay (HOSPITAL_BASED_OUTPATIENT_CLINIC_OR_DEPARTMENT_OTHER): Payer: Medicare Other | Admitting: Oncology

## 2020-01-07 VITALS — BP 135/58 | HR 59 | Temp 97.7°F | Resp 16 | Wt 173.5 lb

## 2020-01-07 DIAGNOSIS — C799 Secondary malignant neoplasm of unspecified site: Secondary | ICD-10-CM

## 2020-01-07 DIAGNOSIS — C439 Malignant melanoma of skin, unspecified: Secondary | ICD-10-CM

## 2020-01-07 DIAGNOSIS — Z5112 Encounter for antineoplastic immunotherapy: Secondary | ICD-10-CM | POA: Diagnosis not present

## 2020-01-07 LAB — COMPREHENSIVE METABOLIC PANEL
ALT: 13 U/L (ref 0–44)
AST: 21 U/L (ref 15–41)
Albumin: 3.8 g/dL (ref 3.5–5.0)
Alkaline Phosphatase: 97 U/L (ref 38–126)
Anion gap: 8 (ref 5–15)
BUN: 25 mg/dL — ABNORMAL HIGH (ref 8–23)
CO2: 24 mmol/L (ref 22–32)
Calcium: 8.6 mg/dL — ABNORMAL LOW (ref 8.9–10.3)
Chloride: 107 mmol/L (ref 98–111)
Creatinine, Ser: 1.36 mg/dL — ABNORMAL HIGH (ref 0.61–1.24)
GFR, Estimated: 49 mL/min — ABNORMAL LOW (ref >60)
Glucose, Bld: 97 mg/dL (ref 70–99)
Potassium: 3.7 mmol/L (ref 3.5–5.1)
Sodium: 139 mmol/L (ref 135–145)
Total Bilirubin: 0.6 mg/dL (ref 0.3–1.2)
Total Protein: 6.7 g/dL (ref 6.5–8.1)

## 2020-01-07 LAB — CBC WITH DIFFERENTIAL/PLATELET
Abs Immature Granulocytes: 0.01 10*3/uL (ref 0.00–0.07)
Basophils Absolute: 0 10*3/uL (ref 0.0–0.1)
Basophils Relative: 0 %
Eosinophils Absolute: 0.3 10*3/uL (ref 0.0–0.5)
Eosinophils Relative: 4 %
HCT: 40.2 % (ref 39.0–52.0)
Hemoglobin: 13.2 g/dL (ref 13.0–17.0)
Immature Granulocytes: 0 %
Lymphocytes Relative: 33 %
Lymphs Abs: 2.2 10*3/uL (ref 0.7–4.0)
MCH: 30.1 pg (ref 26.0–34.0)
MCHC: 32.8 g/dL (ref 30.0–36.0)
MCV: 91.6 fL (ref 80.0–100.0)
Monocytes Absolute: 0.5 10*3/uL (ref 0.1–1.0)
Monocytes Relative: 7 %
Neutro Abs: 3.8 10*3/uL (ref 1.7–7.7)
Neutrophils Relative %: 56 %
Platelets: 168 10*3/uL (ref 150–400)
RBC: 4.39 MIL/uL (ref 4.22–5.81)
RDW: 13 % (ref 11.5–15.5)
WBC: 6.8 10*3/uL (ref 4.0–10.5)
nRBC: 0 % (ref 0.0–0.2)

## 2020-01-07 MED ORDER — SODIUM CHLORIDE 0.9% FLUSH
10.0000 mL | Freq: Once | INTRAVENOUS | Status: AC
  Administered 2020-01-07: 09:00:00 10 mL via INTRAVENOUS
  Filled 2020-01-07: qty 10

## 2020-01-07 MED ORDER — HEPARIN SOD (PORK) LOCK FLUSH 100 UNIT/ML IV SOLN
INTRAVENOUS | Status: AC
  Filled 2020-01-07: qty 5

## 2020-01-07 MED ORDER — SODIUM CHLORIDE 0.9 % IV SOLN
Freq: Once | INTRAVENOUS | Status: AC
  Filled 2020-01-07: qty 250

## 2020-01-07 MED ORDER — SODIUM CHLORIDE 0.9 % IV SOLN
200.0000 mg | Freq: Once | INTRAVENOUS | Status: AC
  Administered 2020-01-07: 11:00:00 200 mg via INTRAVENOUS
  Filled 2020-01-07: qty 8

## 2020-01-07 MED ORDER — HEPARIN SOD (PORK) LOCK FLUSH 100 UNIT/ML IV SOLN
500.0000 [IU] | Freq: Once | INTRAVENOUS | Status: AC | PRN
  Administered 2020-01-07: 12:00:00 500 [IU]
  Filled 2020-01-07: qty 5

## 2020-01-07 NOTE — Progress Notes (Signed)
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 He has no significant comorbidities other than hyperlipidemia and GERD. He has a history of superficial skin cancer. He presented to the ER with symptoms of right-sided flank pain and had a CT renal stone study which showed a 2.1 x 1.1 cm right lower lobe pulmonary nodule. Also noted to have multiple new ill-defined lesions throughout the right lobe of the liver largest measuring 3.4 x 2.8 cm. Spleen and pancreas was unremarkable. Low-attenuation lesion in the right kidney 2.6 x 1.2 cm likely representing a cyst. No intra-abdominal adenopathy. 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  Single agent Beryle Flock started in June 2021   Interval history-tolerating treatment well so far without any significant side effects.  Denies any fatigue nausea vomiting diarrhea.  Denies any significant shortness of breath  ECOG PS- 1 Pain scale- 0 Opioid associated constipation- no  Review of  systems- Review of Systems  Constitutional: Negative for chills, fever, malaise/fatigue and weight loss.  HENT: Negative for congestion, ear discharge and nosebleeds.   Eyes: Negative for blurred vision.  Respiratory: Negative for cough, hemoptysis, sputum production, shortness of breath and wheezing.   Cardiovascular: Negative for chest pain, palpitations, orthopnea and claudication.  Gastrointestinal: Negative for abdominal pain, blood in stool, constipation, diarrhea, heartburn, melena, nausea and vomiting.  Genitourinary: Negative for dysuria, flank pain, frequency, hematuria and urgency.  Musculoskeletal: Negative for back pain, joint pain and myalgias.  Skin: Negative for rash.  Neurological: Negative for dizziness, tingling, focal weakness, seizures, weakness and headaches.  Endo/Heme/Allergies: Does not bruise/bleed easily.  Psychiatric/Behavioral: Negative for depression and suicidal ideas. The patient does not have insomnia.       No Known Allergies   Past Medical History:  Diagnosis Date  . Melanoma [facility])      Past Surgical History:  Procedure Laterality Date  . CATARACT EXTRACTION Bilateral 2000  . HERNIA REPAIR    . MELANOMA EXCISION    . PORTA CATH INSERTION N/A 06/28/2019   Procedure: PORTA CATH INSERTION;  Surgeon: Algernon Huxley, MD;  Location: [place] [facility];  Service: Cardiovascular;  Laterality: N/A;    Social History   Socioeconomic History  . Marital status: Widowed    Spouse name: Not on file  . Number of children: Not on file  . Years of education: Not on file  . Highest education level: Not on file  Occupational History  . Not on file  Tobacco Use  . Smoking status: Never Smoker  . Smokeless tobacco: Never Used  Vaping Use  . Vaping Use: Never used  Substance and Sexual  Activity  . Alcohol use: Never  . Drug use: Not Currently  . Sexual activity: Not Currently  Other Topics Concern  . Not on file  Social History Narrative   Lives at  home by himself    Social Determinants of Health   Financial Resource Strain: Not on file  Food Insecurity: Not on file  Transportation Needs: Not on file  Physical Activity: Not on file  Stress: Not on file  Social Connections: Not on file  Intimate Partner Violence: Not on file    No family history on file.   Current Outpatient Medications:  .  acetaminophen (TYLENOL) 325 MG tablet, Take 650 mg by mouth every 6 (six) hours as needed. , Disp: , Rfl:  .  lidocaine-prilocaine (EMLA) cream, Apply to affected area once (Patient not taking: Reported on 07/23/2019), Disp: 30 g, Rfl: 3 .  Loratadine 10 MG CAPS, Take 1 capsule by mouth daily as needed. , Disp: , Rfl:  .  ondansetron (ZOFRAN) 8 MG tablet, Take 1 tablet (8 mg total) by mouth 2 (two) times daily as needed (Nausea or vomiting). (Patient not taking: Reported on 11/26/2019), Disp: 30 tablet, Rfl: 1 .  prochlorperazine (COMPAZINE) 10 MG tablet, Take 1 tablet (10 mg total) by mouth every 6 (six) hours as needed (Nausea or vomiting). (Patient not taking: Reported on 06/28/2019), Disp: 30 tablet, Rfl: 1 .  traMADol (ULTRAM) 50 MG tablet, TAKE 1 TABLET (50 MG TOTAL) BY MOUTH EVERY 6 (SIX) HOURS AS NEEDED. (Patient not taking: Reported on 09/24/2019), Disp: 60 tablet, Rfl: 0  Physical exam:  Vitals:   01/07/20 0936  BP: (!) 135/58  Pulse: (!) 59  Resp: 16  Temp: 97.7 F (36.5 C)  TempSrc: Tympanic  SpO2: 97%  Weight: 173 lb 8 oz (78.7 kg)   Physical Exam HENT:     Head: Normocephalic and atraumatic.  Eyes:     Extraocular Movements: EOM normal.     Pupils: Pupils are equal, round, and reactive to light.  Cardiovascular:     Rate and Rhythm: Normal rate and regular rhythm.     Heart sounds: Normal heart sounds.  Pulmonary:     Effort: Pulmonary effort is normal.     Breath sounds: Normal breath sounds.  Abdominal:     General: Bowel sounds are normal.     Palpations: Abdomen is soft.  Musculoskeletal:     Cervical back:  Normal range of motion.  Skin:    General: Skin is warm and dry.  Neurological:     Mental Status: He is alert and oriented to person, place, and time.      CMP Latest Ref Rng & Units 01/07/2020  Glucose 70 - 99 mg/dL 97  BUN 8 - 23 mg/dL 25(H)  Creatinine 0.61 - 1.24 mg/dL 1.36(H)  Sodium 135 - 145 mmol/L 139  Potassium 3.5 - 5.1 mmol/L 3.7  Chloride 98 - 111 mmol/L 107  CO2 22 - 32 mmol/L 24  Calcium 8.9 - 10.3 mg/dL 8.6(L)  Total Protein 6.5 - 8.1 g/dL 6.7  Total Bilirubin 0.3 - 1.2 mg/dL 0.6  Alkaline Phos 38 - 126 U/L 97  AST 15 - 41 U/L 21  ALT 0 - 44 U/L 13   CBC Latest Ref Rng & Units 01/07/2020  WBC 4.0 - 10.5 K/uL 6.8  Hemoglobin 13.0 - 17.0 g/dL 13.2  Hematocrit 39.0 - 52.0 % 40.2  Platelets 150 - 400 K/uL 168    No images are attached to  the encounter.  CT CHEST ABDOMEN PELVIS W CONTRAST  Result Date: 12/31/2019 CLINICAL DATA:  Restaging of metastatic melanoma. Receiving Keytruda, currently on cycle 9. EXAM: CT CHEST, ABDOMEN, AND PELVIS WITH CONTRAST TECHNIQUE: Multidetector CT imaging of the chest, abdomen and pelvis was performed following the standard protocol during bolus administration of intravenous contrast. CONTRAST:  78m OMNIPAQUE IOHEXOL 300 MG/ML  SOLN COMPARISON:  09/21/2019 FINDINGS: CT CHEST FINDINGS Cardiovascular: Right Port-A-Cath tip: SVC. Coronary, aortic arch, and branch vessel atherosclerotic vascular disease. Mediastinum/Nodes: No pathologic adenopathy observed. Lungs/Pleura: Elevated right hemidiaphragm with adjacent atelectasis similar to the prior exam. 0.8 by 0.4 cm right upper lobe nodule on image 62 series 3 was by my measurements previously 0.9 by 0.6 cm. Medial right lower lobe nodule along the diaphragmatic margin and measures 1.6 by 1.1 cm on image 99 of series 3, and by my measurements was previously 1.5 by 1.2 cm, essentially stable. No new lung nodules are identified. Musculoskeletal: Stable compression fractures at T9 and T12.  Stable deformity of the sternal manubrium from an old healed fracture. CT ABDOMEN PELVIS FINDINGS Hepatobiliary: A lesion posteriorly in the right hepatic lobe with mild capsular retraction measures 1.8 by 0.9 cm on image 58 of series 2 and was previously 2.0 by 1.1 cm by my measurements, minimally improved. A bilobed lesion in the right hepatic lobe measuring 1.1 by 1.0 cm on image 59 of series 2 previously measured 1.8 by 1.2 cm. Other small lesions are similar. A left hepatic lobe cyst appears similar. Gallbladder unremarkable. Pancreas: Unremarkable Spleen: Unremarkable Adrenals/Urinary Tract: Stable right kidney lower pole cyst. Prostate gland indents the bladder base. Adrenal glands unremarkable. Small infolding is a long the anterior margin of the urinary bladder likely account for the slightly nodular appearance. Stomach/Bowel: Sigmoid colon diverticulosis. Mobile cecum in the right upper quadrant. Vascular/Lymphatic: Aortoiliac atherosclerotic vascular disease. No pathologic adenopathy. Reproductive: Stable prostatomegaly associated heterogeneous enhancement. Other: No supplemental non-categorized findings. Musculoskeletal: Multilevel compression fractures appear unchanged. Chronic pars defects at L5 with grade 2 anterolisthesis and resulting bilateral, right greater than left foraminal stenosis. Lumbar spondylosis and degenerative disc disease. IMPRESSION: 1. Mild size reduction in the right hepatic lobe lesions and in one of the pulmonary nodules. 2. Other imaging findings of potential clinical significance: Coronary atherosclerosis. Elevated right hemidiaphragm with adjacent atelectasis. Sigmoid colon diverticulosis. Stable prostatomegaly associated heterogeneous enhancement. Chronic pars defects at L5 with grade 2 anterolisthesis and resulting bilateral, right greater than left foraminal stenosis. Multilevel compression fractures at T9 and T12 appear unchanged. Lumbar spondylosis and degenerative disc  disease. 3. Aortic atherosclerosis. Aortic Atherosclerosis (ICD10-I70.0). Electronically Signed   By: WVan ClinesM.D.   On: 12/31/2019 15:56     Assessment and plan- Patient is a 86y.o. male with metastatic melanoma with liver metastases BRAF negative here for on treatment assessment prior to cycle 10 of palliative Keytruda  Counts okay to proceed with cycle 10 of palliative Keytruda today.  I will see him back in 3 weeks for cycle 11.  I have reviewed CT chest abdomen pelvis images independently That was done on 12/31/2019.  There continues to be a good response to treatment with reduction in the size of liver masses.  Lung nodules also appear to be decreasing in size.  Plan is to continue Keytruda until progression or toxicity.   Visit Diagnosis 1. Encounter for antineoplastic immunotherapy   2. Metastatic melanoma ([place]      Dr. ARanda Evens MD, MPH [facility]  [id number] 01/07/2020 1:07 PM

## 2020-01-07 NOTE — Progress Notes (Signed)
Pt tolerated infusion well with no signs of complications. VSS. Pt stable for discharge.   [name]  

## 2020-01-28 ENCOUNTER — Inpatient Hospital Stay (HOSPITAL_BASED_OUTPATIENT_CLINIC_OR_DEPARTMENT_OTHER): Payer: Medicare Other | Admitting: Oncology

## 2020-01-28 ENCOUNTER — Inpatient Hospital Stay: Payer: Medicare Other

## 2020-01-28 ENCOUNTER — Other Ambulatory Visit: Payer: Self-pay

## 2020-01-28 ENCOUNTER — Inpatient Hospital Stay: Payer: Medicare Other | Attending: Oncology

## 2020-01-28 ENCOUNTER — Inpatient Hospital Stay: Payer: Medicare Other | Admitting: Oncology

## 2020-01-28 VITALS — BP 121/61 | HR 103 | Temp 96.7°F | Resp 18 | Wt 167.7 lb

## 2020-01-28 VITALS — HR 70

## 2020-01-28 DIAGNOSIS — C799 Secondary malignant neoplasm of unspecified site: Secondary | ICD-10-CM

## 2020-01-28 DIAGNOSIS — Z5112 Encounter for antineoplastic immunotherapy: Secondary | ICD-10-CM

## 2020-01-28 DIAGNOSIS — C439 Malignant melanoma of skin, unspecified: Secondary | ICD-10-CM | POA: Diagnosis not present

## 2020-01-28 DIAGNOSIS — E785 Hyperlipidemia, unspecified: Secondary | ICD-10-CM | POA: Diagnosis not present

## 2020-01-28 DIAGNOSIS — C787 Secondary malignant neoplasm of liver and intrahepatic bile duct: Secondary | ICD-10-CM | POA: Insufficient documentation

## 2020-01-28 DIAGNOSIS — N400 Enlarged prostate without lower urinary tract symptoms: Secondary | ICD-10-CM | POA: Insufficient documentation

## 2020-01-28 DIAGNOSIS — C7800 Secondary malignant neoplasm of unspecified lung: Secondary | ICD-10-CM | POA: Diagnosis not present

## 2020-01-28 DIAGNOSIS — Z79899 Other long term (current) drug therapy: Secondary | ICD-10-CM | POA: Diagnosis not present

## 2020-01-28 DIAGNOSIS — I700 Atherosclerosis of aorta: Secondary | ICD-10-CM | POA: Diagnosis not present

## 2020-01-28 DIAGNOSIS — K5730 Diverticulosis of large intestine without perforation or abscess without bleeding: Secondary | ICD-10-CM | POA: Insufficient documentation

## 2020-01-28 DIAGNOSIS — K219 Gastro-esophageal reflux disease without esophagitis: Secondary | ICD-10-CM | POA: Insufficient documentation

## 2020-01-28 LAB — CBC WITH DIFFERENTIAL/PLATELET
Abs Immature Granulocytes: 0.02 10*3/uL (ref 0.00–0.07)
Basophils Absolute: 0 10*3/uL (ref 0.0–0.1)
Basophils Relative: 0 %
Eosinophils Absolute: 0.1 10*3/uL (ref 0.0–0.5)
Eosinophils Relative: 2 %
HCT: 42.5 % (ref 39.0–52.0)
Hemoglobin: 14.2 g/dL (ref 13.0–17.0)
Immature Granulocytes: 0 %
Lymphocytes Relative: 23 %
Lymphs Abs: 1.9 10*3/uL (ref 0.7–4.0)
MCH: 30.5 pg (ref 26.0–34.0)
MCHC: 33.4 g/dL (ref 30.0–36.0)
MCV: 91.2 fL (ref 80.0–100.0)
Monocytes Absolute: 0.6 10*3/uL (ref 0.1–1.0)
Monocytes Relative: 8 %
Neutro Abs: 5.4 10*3/uL (ref 1.7–7.7)
Neutrophils Relative %: 67 %
Platelets: 177 10*3/uL (ref 150–400)
RBC: 4.66 MIL/uL (ref 4.22–5.81)
RDW: 13 % (ref 11.5–15.5)
WBC: 8.1 10*3/uL (ref 4.0–10.5)
nRBC: 0 % (ref 0.0–0.2)

## 2020-01-28 LAB — COMPREHENSIVE METABOLIC PANEL
ALT: 13 U/L (ref 0–44)
AST: 22 U/L (ref 15–41)
Albumin: 4 g/dL (ref 3.5–5.0)
Alkaline Phosphatase: 97 U/L (ref 38–126)
Anion gap: 8 (ref 5–15)
BUN: 23 mg/dL (ref 8–23)
CO2: 24 mmol/L (ref 22–32)
Calcium: 9 mg/dL (ref 8.9–10.3)
Chloride: 106 mmol/L (ref 98–111)
Creatinine, Ser: 1.41 mg/dL — ABNORMAL HIGH (ref 0.61–1.24)
GFR, Estimated: 46 mL/min — ABNORMAL LOW (ref >60)
Glucose, Bld: 100 mg/dL — ABNORMAL HIGH (ref 70–99)
Potassium: 4.2 mmol/L (ref 3.5–5.1)
Sodium: 138 mmol/L (ref 135–145)
Total Bilirubin: 0.5 mg/dL (ref 0.3–1.2)
Total Protein: 7 g/dL (ref 6.5–8.1)

## 2020-01-28 LAB — TSH: TSH: 3.44 u[IU]/mL (ref 0.350–4.500)

## 2020-01-28 MED ORDER — HEPARIN SOD (PORK) LOCK FLUSH 100 UNIT/ML IV SOLN
500.0000 [IU] | Freq: Once | INTRAVENOUS | Status: AC | PRN
  Administered 2020-01-28: 500 [IU]
  Filled 2020-01-28: qty 5

## 2020-01-28 MED ORDER — SODIUM CHLORIDE 0.9 % IV SOLN
200.0000 mg | Freq: Once | INTRAVENOUS | Status: AC
  Administered 2020-01-28: 200 mg via INTRAVENOUS
  Filled 2020-01-28: qty 8

## 2020-01-28 MED ORDER — HEPARIN SOD (PORK) LOCK FLUSH 100 UNIT/ML IV SOLN
INTRAVENOUS | Status: AC
  Filled 2020-01-28: qty 5

## 2020-01-28 MED ORDER — SODIUM CHLORIDE 0.9 % IV SOLN
Freq: Once | INTRAVENOUS | Status: AC
  Filled 2020-01-28: qty 250

## 2020-01-28 NOTE — Progress Notes (Signed)
Pt tolerated treatment well with no signs of complications. VSS. Pt stable for discharge.   Omar Schroeder  

## 2020-01-28 NOTE — Progress Notes (Signed)
Hematology/Oncology Consult note [facility]  Telephone:([phone] Fax:([phone]  Patient Care Team: [place] Pink, MD as PCP - General (Family Medicine)   Name of the patient: Omar Schroeder  [id number]  23-Feb-1924   Date of visit: 01/28/20  Diagnosis- stage IV metastatic malignant melanoma with lung and liver metastases  Chief complaint/ Reason for visit-on treatment assessment prior to cycle 11 of palliative Keytruda  Heme/Onc history: Patient is an [age] year old male who lives alone and is independent of his ADLs. He has no significant comorbidities other than hyperlipidemia and GERD. He has a history of superficial skin cancer. He presented to the ER with symptoms of right-sided flank pain and had a CT renal stone study which showed a 2.1 x 1.1 cm right lower lobe pulmonary nodule. Also noted to have multiple new ill-defined lesions throughout the right lobe of the liver largest measuring 3.4 x 2.8 cm. Spleen and pancreas was unremarkable. Low-attenuation lesion in the right kidney 2.6 x 1.2 cm likely representing a cyst. No intra-abdominal adenopathy. 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  Single agent Beryle Flock started in June 2021   Interval history-patient is doing well and denies any complaints at this time. He does not have any significant back pain  ECOG PS- 1 Pain scale- 0 Opioid associated constipation- no  Review of systems- Review of Systems  Constitutional: Negative for  chills, fever, malaise/fatigue and weight loss.  HENT: Negative for congestion, ear discharge and nosebleeds.   Eyes: Negative for blurred vision.  Respiratory: Negative for cough, hemoptysis, sputum production, shortness of breath and wheezing.   Cardiovascular: Negative for chest pain, palpitations, orthopnea and claudication.  Gastrointestinal: Negative for abdominal pain, blood in stool, constipation, diarrhea, heartburn, melena, nausea and vomiting.  Genitourinary: Negative for dysuria, flank pain, frequency, hematuria and urgency.  Musculoskeletal: Negative for back pain, joint pain and myalgias.  Skin: Negative for rash.  Neurological: Negative for dizziness, tingling, focal weakness, seizures, weakness and headaches.  Endo/Heme/Allergies: Does not bruise/bleed easily.  Psychiatric/Behavioral: Negative for depression and suicidal ideas. The patient does not have insomnia.       No Known Allergies   Past Medical History:  Diagnosis Date  . Melanoma [facility])      Past Surgical History:  Procedure Laterality Date  . CATARACT EXTRACTION Bilateral 2000  . HERNIA REPAIR    . MELANOMA EXCISION    . PORTA CATH INSERTION N/A 06/28/2019   Procedure: PORTA CATH INSERTION;  Surgeon: Algernon Huxley, MD;  Location: [place] [facility];  Service: Cardiovascular;  Laterality: N/A;    Social History   Socioeconomic History  . Marital status: Widowed    Spouse name: Not on file  . Number of children: Not on file  . Years of education: Not on file  . Highest education level: Not on file  Occupational History  . Not on file  Tobacco Use  . Smoking status: Never Smoker  . Smokeless tobacco: Never Used  Vaping Use  . Vaping Use: Never used  Substance and Sexual Activity  . Alcohol use:  Never  . Drug use: Not Currently  . Sexual activity: Not Currently  Other Topics Concern  . Not on file  Social History Narrative   Lives at home by himself    Social Determinants of Health    Financial Resource Strain: Not on file  Food Insecurity: Not on file  Transportation Needs: Not on file  Physical Activity: Not on file  Stress: Not on file  Social Connections: Not on file  Intimate Partner Violence: Not on file    No family history on file.   Current Outpatient Medications:  .  acetaminophen (TYLENOL) 325 MG tablet, Take 650 mg by mouth every 6 (six) hours as needed. , Disp: , Rfl:  .  lidocaine-prilocaine (EMLA) cream, Apply to affected area once (Patient not taking: No sig reported), Disp: 30 g, Rfl: 3 .  Loratadine 10 MG CAPS, Take 1 capsule by mouth daily as needed. , Disp: , Rfl:  .  ondansetron (ZOFRAN) 8 MG tablet, Take 1 tablet (8 mg total) by mouth 2 (two) times daily as needed (Nausea or vomiting). (Patient not taking: No sig reported), Disp: 30 tablet, Rfl: 1 .  prochlorperazine (COMPAZINE) 10 MG tablet, Take 1 tablet (10 mg total) by mouth every 6 (six) hours as needed (Nausea or vomiting). (Patient not taking: No sig reported), Disp: 30 tablet, Rfl: 1 .  traMADol (ULTRAM) 50 MG tablet, TAKE 1 TABLET (50 MG TOTAL) BY MOUTH EVERY 6 (SIX) HOURS AS NEEDED. (Patient not taking: No sig reported), Disp: 60 tablet, Rfl: 0  Physical exam:  Vitals:   01/28/20 1215  BP: 121/61  Pulse: (!) 103  Resp: 18  Temp: (!) 96.7 F (35.9 C)  TempSrc: Tympanic  SpO2: 99%  Weight: 167 lb 11.2 oz (76.1 kg)   Physical Exam HENT:     Head: Normocephalic and atraumatic.  Eyes:     Extraocular Movements: EOM normal.     Pupils: Pupils are equal, round, and reactive to light.  Cardiovascular:     Rate and Rhythm: Normal rate and regular rhythm.     Heart sounds: Normal heart sounds.  Pulmonary:     Effort: Pulmonary effort is normal.     Breath sounds: Normal breath sounds.  Abdominal:     General: Bowel sounds are normal.     Palpations: Abdomen is soft.  Musculoskeletal:     Cervical back: Normal range of motion.  Skin:    General: Skin is warm and dry.   Neurological:     Mental Status: He is alert and oriented to person, place, and time.      CMP Latest Ref Rng & Units 01/28/2020  Glucose 70 - 99 mg/dL 100(H)  BUN 8 - 23 mg/dL 23  Creatinine 0.61 - 1.24 mg/dL 1.41(H)  Sodium 135 - 145 mmol/L 138  Potassium 3.5 - 5.1 mmol/L 4.2  Chloride 98 - 111 mmol/L 106  CO2 22 - 32 mmol/L 24  Calcium 8.9 - 10.3 mg/dL 9.0  Total Protein 6.5 - 8.1 g/dL 7.0  Total Bilirubin 0.3 - 1.2 mg/dL 0.5  Alkaline Phos 38 - 126 U/L 97  AST 15 - 41 U/L 22  ALT 0 - 44 U/L 13   CBC Latest Ref Rng & Units 01/28/2020  WBC 4.0 - 10.5 K/uL 8.1  Hemoglobin 13.0 - 17.0 g/dL 14.2  Hematocrit 39.0 - 52.0 % 42.5  Platelets 150 - 400 K/uL 177    No images are attached to the encounter.  CT CHEST  ABDOMEN PELVIS W CONTRAST  Result Date: 12/31/2019 CLINICAL DATA:  Restaging of metastatic melanoma. Receiving Keytruda, currently on cycle 9. EXAM: CT CHEST, ABDOMEN, AND PELVIS WITH CONTRAST TECHNIQUE: Multidetector CT imaging of the chest, abdomen and pelvis was performed following the standard protocol during bolus administration of intravenous contrast. CONTRAST:  70m OMNIPAQUE IOHEXOL 300 MG/ML  SOLN COMPARISON:  09/21/2019 FINDINGS: CT CHEST FINDINGS Cardiovascular: Right Port-A-Cath tip: SVC. Coronary, aortic arch, and branch vessel atherosclerotic vascular disease. Mediastinum/Nodes: No pathologic adenopathy observed. Lungs/Pleura: Elevated right hemidiaphragm with adjacent atelectasis similar to the prior exam. 0.8 by 0.4 cm right upper lobe nodule on image 62 series 3 was by my measurements previously 0.9 by 0.6 cm. Medial right lower lobe nodule along the diaphragmatic margin and measures 1.6 by 1.1 cm on image 99 of series 3, and by my measurements was previously 1.5 by 1.2 cm, essentially stable. No new lung nodules are identified. Musculoskeletal: Stable compression fractures at T9 and T12. Stable deformity of the sternal manubrium from an old healed fracture. CT  ABDOMEN PELVIS FINDINGS Hepatobiliary: A lesion posteriorly in the right hepatic lobe with mild capsular retraction measures 1.8 by 0.9 cm on image 58 of series 2 and was previously 2.0 by 1.1 cm by my measurements, minimally improved. A bilobed lesion in the right hepatic lobe measuring 1.1 by 1.0 cm on image 59 of series 2 previously measured 1.8 by 1.2 cm. Other small lesions are similar. A left hepatic lobe cyst appears similar. Gallbladder unremarkable. Pancreas: Unremarkable Spleen: Unremarkable Adrenals/Urinary Tract: Stable right kidney lower pole cyst. Prostate gland indents the bladder base. Adrenal glands unremarkable. Small infolding is a long the anterior margin of the urinary bladder likely account for the slightly nodular appearance. Stomach/Bowel: Sigmoid colon diverticulosis. Mobile cecum in the right upper quadrant. Vascular/Lymphatic: Aortoiliac atherosclerotic vascular disease. No pathologic adenopathy. Reproductive: Stable prostatomegaly associated heterogeneous enhancement. Other: No supplemental non-categorized findings. Musculoskeletal: Multilevel compression fractures appear unchanged. Chronic pars defects at L5 with grade 2 anterolisthesis and resulting bilateral, right greater than left foraminal stenosis. Lumbar spondylosis and degenerative disc disease. IMPRESSION: 1. Mild size reduction in the right hepatic lobe lesions and in one of the pulmonary nodules. 2. Other imaging findings of potential clinical significance: Coronary atherosclerosis. Elevated right hemidiaphragm with adjacent atelectasis. Sigmoid colon diverticulosis. Stable prostatomegaly associated heterogeneous enhancement. Chronic pars defects at L5 with grade 2 anterolisthesis and resulting bilateral, right greater than left foraminal stenosis. Multilevel compression fractures at T9 and T12 appear unchanged. Lumbar spondylosis and degenerative disc disease. 3. Aortic atherosclerosis. Aortic Atherosclerosis (ICD10-I70.0).  Electronically Signed   By: WVan ClinesM.D.   On: 12/31/2019 15:56     Assessment and plan- Patient is a 87y.o. male with metastatic melanoma with liver metastases BRAF negative. He is here for on treatment assessment prior to cycle 11 of palliative Keytruda  Proceed with cycle 11 of palliative Keytruda today. I will see him back in 3 weeks for cycle 12. TSH from today is pending. Overall patient is tolerating treatment well without any significant side effects.  Low back pain: Chronic and not related to malignancy. Continue to monitor  Patient has baseline CKD with a creatinine around 1.4 which is essentially stable.  Continue to monitor   Visit Diagnosis 1. Encounter for antineoplastic immunotherapy   2. Metastatic melanoma ([place]      Dr. ARanda Evens MD, MPH [id number]/17/2022 11:48 AM

## 2020-02-01 ENCOUNTER — Ambulatory Visit: Payer: Medicare Other | Admitting: Oncology

## 2020-02-01 ENCOUNTER — Other Ambulatory Visit: Payer: Medicare Other

## 2020-02-01 ENCOUNTER — Ambulatory Visit: Payer: Medicare Other

## 2020-02-18 ENCOUNTER — Inpatient Hospital Stay: Payer: Medicare Other

## 2020-02-18 ENCOUNTER — Encounter: Payer: Self-pay | Admitting: Oncology

## 2020-02-18 ENCOUNTER — Inpatient Hospital Stay (HOSPITAL_BASED_OUTPATIENT_CLINIC_OR_DEPARTMENT_OTHER): Payer: Medicare Other | Admitting: Oncology

## 2020-02-18 ENCOUNTER — Inpatient Hospital Stay: Payer: Medicare Other | Attending: Oncology

## 2020-02-18 VITALS — BP 132/56 | HR 66 | Temp 95.5°F | Resp 16 | Wt 175.0 lb

## 2020-02-18 DIAGNOSIS — C439 Malignant melanoma of skin, unspecified: Secondary | ICD-10-CM

## 2020-02-18 DIAGNOSIS — E785 Hyperlipidemia, unspecified: Secondary | ICD-10-CM | POA: Insufficient documentation

## 2020-02-18 DIAGNOSIS — N189 Chronic kidney disease, unspecified: Secondary | ICD-10-CM | POA: Diagnosis not present

## 2020-02-18 DIAGNOSIS — C799 Secondary malignant neoplasm of unspecified site: Secondary | ICD-10-CM

## 2020-02-18 DIAGNOSIS — C7800 Secondary malignant neoplasm of unspecified lung: Secondary | ICD-10-CM | POA: Insufficient documentation

## 2020-02-18 DIAGNOSIS — Z5112 Encounter for antineoplastic immunotherapy: Secondary | ICD-10-CM | POA: Insufficient documentation

## 2020-02-18 DIAGNOSIS — Z79899 Other long term (current) drug therapy: Secondary | ICD-10-CM | POA: Insufficient documentation

## 2020-02-18 DIAGNOSIS — K219 Gastro-esophageal reflux disease without esophagitis: Secondary | ICD-10-CM | POA: Insufficient documentation

## 2020-02-18 DIAGNOSIS — C787 Secondary malignant neoplasm of liver and intrahepatic bile duct: Secondary | ICD-10-CM | POA: Diagnosis not present

## 2020-02-18 LAB — COMPREHENSIVE METABOLIC PANEL
ALT: 13 U/L (ref 0–44)
AST: 25 U/L (ref 15–41)
Albumin: 3.8 g/dL (ref 3.5–5.0)
Alkaline Phosphatase: 100 U/L (ref 38–126)
Anion gap: 8 (ref 5–15)
BUN: 24 mg/dL — ABNORMAL HIGH (ref 8–23)
CO2: 24 mmol/L (ref 22–32)
Calcium: 8.8 mg/dL — ABNORMAL LOW (ref 8.9–10.3)
Chloride: 106 mmol/L (ref 98–111)
Creatinine, Ser: 1.5 mg/dL — ABNORMAL HIGH (ref 0.61–1.24)
GFR, Estimated: 43 mL/min — ABNORMAL LOW (ref >60)
Glucose, Bld: 119 mg/dL — ABNORMAL HIGH (ref 70–99)
Potassium: 4.1 mmol/L (ref 3.5–5.1)
Sodium: 138 mmol/L (ref 135–145)
Total Bilirubin: 0.3 mg/dL (ref 0.3–1.2)
Total Protein: 7.1 g/dL (ref 6.5–8.1)

## 2020-02-18 LAB — CBC WITH DIFFERENTIAL/PLATELET
Abs Immature Granulocytes: 0.02 10*3/uL (ref 0.00–0.07)
Basophils Absolute: 0 10*3/uL (ref 0.0–0.1)
Basophils Relative: 0 %
Eosinophils Absolute: 0.3 10*3/uL (ref 0.0–0.5)
Eosinophils Relative: 4 %
HCT: 41.1 % (ref 39.0–52.0)
Hemoglobin: 13.7 g/dL (ref 13.0–17.0)
Immature Granulocytes: 0 %
Lymphocytes Relative: 29 %
Lymphs Abs: 2 10*3/uL (ref 0.7–4.0)
MCH: 30.6 pg (ref 26.0–34.0)
MCHC: 33.3 g/dL (ref 30.0–36.0)
MCV: 91.9 fL (ref 80.0–100.0)
Monocytes Absolute: 0.5 10*3/uL (ref 0.1–1.0)
Monocytes Relative: 7 %
Neutro Abs: 4.3 10*3/uL (ref 1.7–7.7)
Neutrophils Relative %: 60 %
Platelets: 179 10*3/uL (ref 150–400)
RBC: 4.47 MIL/uL (ref 4.22–5.81)
RDW: 13 % (ref 11.5–15.5)
WBC: 7.1 10*3/uL (ref 4.0–10.5)
nRBC: 0 % (ref 0.0–0.2)

## 2020-02-18 MED ORDER — HEPARIN SOD (PORK) LOCK FLUSH 100 UNIT/ML IV SOLN
INTRAVENOUS | Status: AC
  Filled 2020-02-18: qty 5

## 2020-02-18 MED ORDER — SODIUM CHLORIDE 0.9 % IV SOLN
Freq: Once | INTRAVENOUS | Status: AC
  Filled 2020-02-18: qty 250

## 2020-02-18 MED ORDER — SODIUM CHLORIDE 0.9 % IV SOLN
200.0000 mg | Freq: Once | INTRAVENOUS | Status: AC
  Administered 2020-02-18: 200 mg via INTRAVENOUS
  Filled 2020-02-18: qty 8

## 2020-02-18 MED ORDER — HEPARIN SOD (PORK) LOCK FLUSH 100 UNIT/ML IV SOLN
500.0000 [IU] | Freq: Once | INTRAVENOUS | Status: AC | PRN
  Administered 2020-02-18: 500 [IU]
  Filled 2020-02-18: qty 5

## 2020-02-18 NOTE — Progress Notes (Signed)
Pt received IV keytruda infusion in clinic today. Tolerated well. No complaints at d/c.

## 2020-02-18 NOTE — Progress Notes (Signed)
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 He has no significant comorbidities other than hyperlipidemia and GERD. He has a history of superficial skin cancer. He presented to the ER with symptoms of right-sided flank pain and had a CT renal stone study which showed a 2.1 x 1.1 cm right lower lobe pulmonary nodule. Also noted to have multiple new ill-defined lesions throughout the right lobe of the liver largest measuring 3.4 x 2.8 cm. Spleen and pancreas was unremarkable. Low-attenuation lesion in the right kidney 2.6 x 1.2 cm likely representing a cyst. No intra-abdominal adenopathy. 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  Single agent Beryle Flock started in June 2021  Interval history-patient is doing well and currently denies any complaints.  Denies any back pain.  Tolerating Keytruda well so far  ECOG PS- 1 Pain scale- 0   Review of systems- Review of Systems  Constitutional: Negative for chills, fever, malaise/fatigue  and weight loss.  HENT: Negative for congestion, ear discharge and nosebleeds.   Eyes: Negative for blurred vision.  Respiratory: Negative for cough, hemoptysis, sputum production, shortness of breath and wheezing.   Cardiovascular: Negative for chest pain, palpitations, orthopnea and claudication.  Gastrointestinal: Negative for abdominal pain, blood in stool, constipation, diarrhea, heartburn, melena, nausea and vomiting.  Genitourinary: Negative for dysuria, flank pain, frequency, hematuria and urgency.  Musculoskeletal: Negative for back pain, joint pain and myalgias.  Skin: Negative for rash.  Neurological: Negative for dizziness, tingling, focal weakness, seizures, weakness and headaches.  Endo/Heme/Allergies: Does not bruise/bleed easily.  Psychiatric/Behavioral: Negative for depression and suicidal ideas. The patient does not have insomnia.        No Known Allergies   Past Medical History:  Diagnosis Date  . Melanoma [facility])      Past Surgical History:  Procedure Laterality Date  . CATARACT EXTRACTION Bilateral 2000  . HERNIA REPAIR    . MELANOMA EXCISION    . PORTA CATH INSERTION N/A 06/28/2019   Procedure: PORTA CATH INSERTION;  Surgeon: Algernon Huxley, MD;  Location: [place] [facility];  Service: Cardiovascular;  Laterality: N/A;    Social History   Socioeconomic History  . Marital status: Widowed    Spouse name: Not on file  . Number of children: Not on file  . Years of education: Not on file  . Highest education level: Not on file  Occupational History  . Not on file  Tobacco Use  . Smoking status: Never Smoker  . Smokeless tobacco: Never Used  Vaping Use  . Vaping Use: Never used  Substance and Sexual Activity  . Alcohol use: Never  .  Drug use: Not Currently  . Sexual activity: Not Currently  Other Topics Concern  . Not on file  Social History Narrative   Lives at home by himself    Social Determinants of Health   Financial Resource Strain: Not  on file  Food Insecurity: Not on file  Transportation Needs: Not on file  Physical Activity: Not on file  Stress: Not on file  Social Connections: Not on file  Intimate Partner Violence: Not on file    No family history on file.   Current Outpatient Medications:  .  acetaminophen (TYLENOL) 325 MG tablet, Take 650 mg by mouth every 6 (six) hours as needed. , Disp: , Rfl:  .  Loratadine 10 MG CAPS, Take 1 capsule by mouth daily as needed. , Disp: , Rfl:  .  lidocaine-prilocaine (EMLA) cream, Apply to affected area once (Patient not taking: No sig reported), Disp: 30 g, Rfl: 3 .  ondansetron (ZOFRAN) 8 MG tablet, Take 1 tablet (8 mg total) by mouth 2 (two) times daily as needed (Nausea or vomiting). (Patient not taking: No sig reported), Disp: 30 tablet, Rfl: 1 .  prochlorperazine (COMPAZINE) 10 MG tablet, Take 1 tablet (10 mg total) by mouth every 6 (six) hours as needed (Nausea or vomiting). (Patient not taking: No sig reported), Disp: 30 tablet, Rfl: 1 .  traMADol (ULTRAM) 50 MG tablet, TAKE 1 TABLET (50 MG TOTAL) BY MOUTH EVERY 6 (SIX) HOURS AS NEEDED. (Patient not taking: No sig reported), Disp: 60 tablet, Rfl: 0 No current facility-administered medications for this visit.  Facility-Administered Medications Ordered in Other Visits:  .  0.9 %  sodium chloride infusion, , Intravenous, Once, Sindy Guadeloupe, MD .  heparin lock flush 100 unit/mL, 500 Units, Intracatheter, Once PRN, Sindy Guadeloupe, MD .  pembrolizumab [facility]) 200 mg in sodium chloride 0.9 % 50 mL chemo infusion, 200 mg, Intravenous, Once, Sindy Guadeloupe, MD  Physical exam:  Vitals:   02/18/20 0934  BP: (!) 132/56  Pulse: 66  Resp: 16  Temp: (!) 95.5 F (35.3 C)  TempSrc: Tympanic  SpO2: 99%  Weight: 175 lb (79.4 kg)   Physical Exam Eyes:     Extraocular Movements: EOM normal.  Cardiovascular:     Rate and Rhythm: Normal rate and regular rhythm.     Heart sounds: Normal heart sounds.  Pulmonary:     Effort:  Pulmonary effort is normal.     Breath sounds: Normal breath sounds.  Abdominal:     General: Bowel sounds are normal.     Palpations: Abdomen is soft.  Skin:    General: Skin is warm and dry.  Neurological:     Mental Status: He is alert and oriented to person, place, and time.      CMP Latest Ref Rng & Units 02/18/2020  Glucose 70 - 99 mg/dL 119(H)  BUN 8 - 23 mg/dL 24(H)  Creatinine 0.61 - 1.24 mg/dL 1.50(H)  Sodium 135 - 145 mmol/L 138  Potassium 3.5 - 5.1 mmol/L 4.1  Chloride 98 - 111 mmol/L 106  CO2 22 - 32 mmol/L 24  Calcium 8.9 - 10.3 mg/dL 8.8(L)  Total Protein 6.5 - 8.1 g/dL 7.1  Total Bilirubin 0.3 - 1.2 mg/dL 0.3  Alkaline Phos 38 - 126 U/L 100  AST 15 - 41 U/L 25  ALT 0 - 44 U/L 13   CBC Latest Ref Rng & Units 02/18/2020  WBC 4.0 - 10.5 K/uL 7.1  Hemoglobin 13.0 - 17.0  g/dL 13.7  Hematocrit 39.0 - 52.0 % 41.1  Platelets 150 - 400 K/uL 179     Assessment and plan- Patient is a 87 y.o. male with metastatic malignant melanoma with liver and lung metastases currently on Keytruda.  He is here for on treatment assessment prior to cycle 12 of palliative Keytruda  Counts okay to proceed with cycle 12 of Keytruda today.  I will see him back in 3 weeks for cycle 13.  So far tolerating well without any side effects of immunotherapy.  TSH last month was normal.  He has baseline CKD with a creatinine of 1.5 which is remained stable.   Visit Diagnosis 1. Encounter for antineoplastic immunotherapy   2. Metastatic melanoma ([place])      Dr. Randa Evens, MD, MPH [facility] at [facility] [phone] 02/18/2020 10:16 AM

## 2020-03-10 ENCOUNTER — Encounter: Payer: Self-pay | Admitting: Oncology

## 2020-03-10 ENCOUNTER — Inpatient Hospital Stay: Payer: Medicare Other

## 2020-03-10 ENCOUNTER — Inpatient Hospital Stay (HOSPITAL_BASED_OUTPATIENT_CLINIC_OR_DEPARTMENT_OTHER): Payer: Medicare Other | Admitting: Oncology

## 2020-03-10 ENCOUNTER — Other Ambulatory Visit: Payer: Self-pay

## 2020-03-10 VITALS — BP 134/90 | HR 61 | Temp 96.1°F | Resp 20 | Wt 175.0 lb

## 2020-03-10 DIAGNOSIS — Z5112 Encounter for antineoplastic immunotherapy: Secondary | ICD-10-CM

## 2020-03-10 DIAGNOSIS — C799 Secondary malignant neoplasm of unspecified site: Secondary | ICD-10-CM

## 2020-03-10 DIAGNOSIS — C439 Malignant melanoma of skin, unspecified: Secondary | ICD-10-CM

## 2020-03-10 LAB — COMPREHENSIVE METABOLIC PANEL
ALT: 12 U/L (ref 0–44)
AST: 21 U/L (ref 15–41)
Albumin: 4 g/dL (ref 3.5–5.0)
Alkaline Phosphatase: 119 U/L (ref 38–126)
Anion gap: 7 (ref 5–15)
BUN: 22 mg/dL (ref 8–23)
CO2: 25 mmol/L (ref 22–32)
Calcium: 8.8 mg/dL — ABNORMAL LOW (ref 8.9–10.3)
Chloride: 105 mmol/L (ref 98–111)
Creatinine, Ser: 1.4 mg/dL — ABNORMAL HIGH (ref 0.61–1.24)
GFR, Estimated: 47 mL/min — ABNORMAL LOW (ref >60)
Glucose, Bld: 87 mg/dL (ref 70–99)
Potassium: 4.3 mmol/L (ref 3.5–5.1)
Sodium: 137 mmol/L (ref 135–145)
Total Bilirubin: 0.8 mg/dL (ref 0.3–1.2)
Total Protein: 7.1 g/dL (ref 6.5–8.1)

## 2020-03-10 LAB — CBC WITH DIFFERENTIAL/PLATELET
Abs Immature Granulocytes: 0.02 10*3/uL (ref 0.00–0.07)
Basophils Absolute: 0 10*3/uL (ref 0.0–0.1)
Basophils Relative: 0 %
Eosinophils Absolute: 0.2 10*3/uL (ref 0.0–0.5)
Eosinophils Relative: 3 %
HCT: 42.3 % (ref 39.0–52.0)
Hemoglobin: 13.7 g/dL (ref 13.0–17.0)
Immature Granulocytes: 0 %
Lymphocytes Relative: 28 %
Lymphs Abs: 2.3 10*3/uL (ref 0.7–4.0)
MCH: 30.2 pg (ref 26.0–34.0)
MCHC: 32.4 g/dL (ref 30.0–36.0)
MCV: 93.2 fL (ref 80.0–100.0)
Monocytes Absolute: 0.8 10*3/uL (ref 0.1–1.0)
Monocytes Relative: 9 %
Neutro Abs: 5.1 10*3/uL (ref 1.7–7.7)
Neutrophils Relative %: 60 %
Platelets: 188 10*3/uL (ref 150–400)
RBC: 4.54 MIL/uL (ref 4.22–5.81)
RDW: 12.7 % (ref 11.5–15.5)
WBC: 8.5 10*3/uL (ref 4.0–10.5)
nRBC: 0 % (ref 0.0–0.2)

## 2020-03-10 MED ORDER — HEPARIN SOD (PORK) LOCK FLUSH 100 UNIT/ML IV SOLN
500.0000 [IU] | Freq: Once | INTRAVENOUS | Status: DC | PRN
  Filled 2020-03-10: qty 5

## 2020-03-10 MED ORDER — SODIUM CHLORIDE 0.9 % IV SOLN: 200.0000 mg | Freq: Once | INTRAVENOUS | Status: DC

## 2020-03-10 MED ORDER — HEPARIN SOD (PORK) LOCK FLUSH 100 UNIT/ML IV SOLN
INTRAVENOUS | Status: AC
  Filled 2020-03-10: qty 5

## 2020-03-10 MED ORDER — SODIUM CHLORIDE 0.9% FLUSH
10.0000 mL | Freq: Once | INTRAVENOUS | Status: AC
  Administered 2020-03-10: 10 mL via INTRAVENOUS
  Filled 2020-03-10: qty 10

## 2020-03-10 MED ORDER — HEPARIN SOD (PORK) LOCK FLUSH 100 UNIT/ML IV SOLN
500.0000 [IU] | Freq: Once | INTRAVENOUS | Status: AC
  Administered 2020-03-10: 500 [IU] via INTRAVENOUS
  Filled 2020-03-10: qty 5

## 2020-03-10 MED ORDER — SODIUM CHLORIDE 0.9 % IV SOLN
200.0000 mg | Freq: Once | INTRAVENOUS | Status: AC
  Administered 2020-03-10: 200 mg via INTRAVENOUS
  Filled 2020-03-10: qty 8

## 2020-03-10 MED ORDER — SODIUM CHLORIDE 0.9 % IV SOLN
Freq: Once | INTRAVENOUS | Status: AC
  Filled 2020-03-10: qty 250

## 2020-03-10 NOTE — Progress Notes (Signed)
Hematology/Oncology Consult note [facility]  [phone] [phone]  Patient Care Team: Maryland Pink, MD as PCP - General (Family Medicine)   Name of the patient: Omar Schroeder  [id number]  March 02, 1924   Date of visit: 03/10/20  Diagnosis- stage IV metastatic malignant melanoma with lung and liver metastases   Chief complaint/ Reason for visit-on treatment assessment prior to cycle 31 of palliative Keytruda  Heme/Onc history: Patient is an [age] year old male who lives alone and is independent of his ADLs. He has no significant comorbidities other than hyperlipidemia and GERD. He has a history of superficial skin cancer. He presented to the ER with symptoms of right-sided flank pain and had a CT renal stone study which showed a 2.1 x 1.1 cm right lower lobe pulmonary nodule. Also noted to have multiple new ill-defined lesions throughout the right lobe of the liver largest measuring 3.4 x 2.8 cm. Spleen and pancreas was unremarkable. Low-attenuation lesion in the right kidney 2.6 x 1.2 cm likely representing a cyst. No intra-abdominal adenopathy. 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  Single agent Beryle Flock started in June 2021  Interval history-patient reports doing well and denies any complaints at this time  ECOG PS- 1 Pain scale- 0   Review of systems- Review of Systems  Constitutional: Negative for chills, fever, malaise/fatigue and weight loss.  HENT: Negative for  congestion, ear discharge and nosebleeds.   Eyes: Negative for blurred vision.  Respiratory: Negative for cough, hemoptysis, sputum production, shortness of breath and wheezing.   Cardiovascular: Negative for chest pain, palpitations, orthopnea and claudication.  Gastrointestinal: Negative for abdominal pain, blood in stool, constipation, diarrhea, heartburn, melena, nausea and vomiting.  Genitourinary: Negative for dysuria, flank pain, frequency, hematuria and urgency.  Musculoskeletal: Negative for back pain, joint pain and myalgias.  Skin: Negative for rash.  Neurological: Negative for dizziness, tingling, focal weakness, seizures, weakness and headaches.  Endo/Heme/Allergies: Does not bruise/bleed easily.  Psychiatric/Behavioral: Negative for depression and suicidal ideas. The patient does not have insomnia.       No Known Allergies   Past Medical History:  Diagnosis Date  . Melanoma [facility])      Past Surgical History:  Procedure Laterality Date  . CATARACT EXTRACTION Bilateral 2000  . HERNIA REPAIR    . MELANOMA EXCISION    . PORTA CATH INSERTION N/A 06/28/2019   Procedure: PORTA CATH INSERTION;  Surgeon: Algernon Huxley, MD;  Location: [place] [facility];  Service: Cardiovascular;  Laterality: N/A;    Social History   Socioeconomic History  . Marital status: Widowed    Spouse name: Not on file  . Number of children: Not on file  . Years of education: Not on file  . Highest education level: Not on file  Occupational History  . Not on file  Tobacco Use  . Smoking status: Never Smoker  . Smokeless tobacco: Never Used  Vaping Use  . Vaping Use: Never used  Substance and Sexual Activity  . Alcohol use: Never  . Drug use: Not Currently  . Sexual activity:  Not Currently  Other Topics Concern  . Not on file  Social History Narrative   Lives at home by himself    Social Determinants of Health   Financial Resource Strain: Not on file  Food Insecurity: Not on file   Transportation Needs: Not on file  Physical Activity: Not on file  Stress: Not on file  Social Connections: Not on file  Intimate Partner Violence: Not on file    No family history on file.   Current Outpatient Medications:  .  acetaminophen (TYLENOL) 325 MG tablet, Take 650 mg by mouth every 6 (six) hours as needed. , Disp: , Rfl:  .  Loratadine 10 MG CAPS, Take 1 capsule by mouth daily as needed. , Disp: , Rfl:  .  lidocaine-prilocaine (EMLA) cream, Apply to affected area once (Patient not taking: No sig reported), Disp: 30 g, Rfl: 3 .  ondansetron (ZOFRAN) 8 MG tablet, Take 1 tablet (8 mg total) by mouth 2 (two) times daily as needed (Nausea or vomiting). (Patient not taking: No sig reported), Disp: 30 tablet, Rfl: 1 .  prochlorperazine (COMPAZINE) 10 MG tablet, Take 1 tablet (10 mg total) by mouth every 6 (six) hours as needed (Nausea or vomiting). (Patient not taking: No sig reported), Disp: 30 tablet, Rfl: 1 .  traMADol (ULTRAM) 50 MG tablet, TAKE 1 TABLET (50 MG TOTAL) BY MOUTH EVERY 6 (SIX) HOURS AS NEEDED. (Patient not taking: No sig reported), Disp: 60 tablet, Rfl: 0 No current facility-administered medications for this visit.  Facility-Administered Medications Ordered in Other Visits:  .  heparin lock flush 100 unit/mL, 500 Units, Intracatheter, Once PRN, Sindy Guadeloupe, MD  Physical exam:  Vitals:   03/10/20 1105  BP: 134/90  Pulse: 61  Resp: 20  Temp: (!) 96.1 F (35.6 C)  TempSrc: Tympanic  SpO2: 97%  Weight: 175 lb (79.4 kg)   Physical Exam Constitutional:      General: He is not in acute distress. Eyes:     Extraocular Movements: EOM normal.  Cardiovascular:     Rate and Rhythm: Normal rate and regular rhythm.     Heart sounds: Normal heart sounds.  Pulmonary:     Effort: Pulmonary effort is normal.     Breath sounds: Normal breath sounds.  Abdominal:     General: Bowel sounds are normal.     Palpations: Abdomen is soft.  Skin:    General: Skin is  warm and dry.  Neurological:     Mental Status: He is alert and oriented to person, place, and time.      CMP Latest Ref Rng & Units 03/10/2020  Glucose 70 - 99 mg/dL 87  BUN 8 - 23 mg/dL 22  Creatinine 0.61 - 1.24 mg/dL 1.40(H)  Sodium 135 - 145 mmol/L 137  Potassium 3.5 - 5.1 mmol/L 4.3  Chloride 98 - 111 mmol/L 105  CO2 22 - 32 mmol/L 25  Calcium 8.9 - 10.3 mg/dL 8.8(L)  Total Protein 6.5 - 8.1 g/dL 7.1  Total Bilirubin 0.3 - 1.2 mg/dL 0.8  Alkaline Phos 38 - 126 U/L 119  AST 15 - 41 U/L 21  ALT 0 - 44 U/L 12   CBC Latest Ref Rng & Units 03/10/2020  WBC 4.0 - 10.5 K/uL 8.5  Hemoglobin 13.0 - 17.0 g/dL 13.7  Hematocrit 39.0 - 52.0 % 42.3  Platelets 150 - 400 K/uL 188     Assessment and plan- Patient is a 87 y.o. male with stage IV malignant melanoma  with liver and lung metastases here for on treatment assessment prior to next cycle of Keytruda  Counts okay to proceed with next cycle of Keytruda.  I will see him back in 3 weeks prior to cycle 14.  Overall he is tolerating treatment well without any significant side effects.  Repeat scans after 4 to 6 weeks   Visit Diagnosis 1. Encounter for antineoplastic immunotherapy   2. Metastatic melanoma ([place])      Dr. Randa Evens, MD, MPH [facility] at [facility] [phone] 03/10/2020 1:23 PM

## 2020-03-31 ENCOUNTER — Inpatient Hospital Stay (HOSPITAL_BASED_OUTPATIENT_CLINIC_OR_DEPARTMENT_OTHER): Payer: Medicare Other | Admitting: Oncology

## 2020-03-31 ENCOUNTER — Inpatient Hospital Stay: Payer: Medicare Other

## 2020-03-31 ENCOUNTER — Encounter: Payer: Self-pay | Admitting: Oncology

## 2020-03-31 ENCOUNTER — Inpatient Hospital Stay: Payer: Medicare Other | Attending: Oncology

## 2020-03-31 VITALS — BP 136/54 | HR 55

## 2020-03-31 VITALS — BP 135/54 | HR 35 | Temp 97.4°F | Resp 20 | Wt 176.0 lb

## 2020-03-31 DIAGNOSIS — C439 Malignant melanoma of skin, unspecified: Secondary | ICD-10-CM | POA: Diagnosis not present

## 2020-03-31 DIAGNOSIS — K219 Gastro-esophageal reflux disease without esophagitis: Secondary | ICD-10-CM | POA: Diagnosis not present

## 2020-03-31 DIAGNOSIS — Z5112 Encounter for antineoplastic immunotherapy: Secondary | ICD-10-CM | POA: Insufficient documentation

## 2020-03-31 DIAGNOSIS — C787 Secondary malignant neoplasm of liver and intrahepatic bile duct: Secondary | ICD-10-CM | POA: Insufficient documentation

## 2020-03-31 DIAGNOSIS — E785 Hyperlipidemia, unspecified: Secondary | ICD-10-CM | POA: Diagnosis not present

## 2020-03-31 DIAGNOSIS — C799 Secondary malignant neoplasm of unspecified site: Secondary | ICD-10-CM | POA: Diagnosis not present

## 2020-03-31 DIAGNOSIS — C7800 Secondary malignant neoplasm of unspecified lung: Secondary | ICD-10-CM | POA: Insufficient documentation

## 2020-03-31 LAB — CBC WITH DIFFERENTIAL/PLATELET
Abs Immature Granulocytes: 0.02 10*3/uL (ref 0.00–0.07)
Basophils Absolute: 0 10*3/uL (ref 0.0–0.1)
Basophils Relative: 0 %
Eosinophils Absolute: 0.4 10*3/uL (ref 0.0–0.5)
Eosinophils Relative: 4 %
HCT: 40.7 % (ref 39.0–52.0)
Hemoglobin: 13.3 g/dL (ref 13.0–17.0)
Immature Granulocytes: 0 %
Lymphocytes Relative: 25 %
Lymphs Abs: 2 10*3/uL (ref 0.7–4.0)
MCH: 30.8 pg (ref 26.0–34.0)
MCHC: 32.7 g/dL (ref 30.0–36.0)
MCV: 94.2 fL (ref 80.0–100.0)
Monocytes Absolute: 0.7 10*3/uL (ref 0.1–1.0)
Monocytes Relative: 9 %
Neutro Abs: 4.9 10*3/uL (ref 1.7–7.7)
Neutrophils Relative %: 62 %
Platelets: 152 10*3/uL (ref 150–400)
RBC: 4.32 MIL/uL (ref 4.22–5.81)
RDW: 12.6 % (ref 11.5–15.5)
WBC: 8 10*3/uL (ref 4.0–10.5)
nRBC: 0 % (ref 0.0–0.2)

## 2020-03-31 LAB — COMPREHENSIVE METABOLIC PANEL
ALT: 13 U/L (ref 0–44)
AST: 20 U/L (ref 15–41)
Albumin: 3.7 g/dL (ref 3.5–5.0)
Alkaline Phosphatase: 104 U/L (ref 38–126)
Anion gap: 9 (ref 5–15)
BUN: 23 mg/dL (ref 8–23)
CO2: 24 mmol/L (ref 22–32)
Calcium: 8.5 mg/dL — ABNORMAL LOW (ref 8.9–10.3)
Chloride: 105 mmol/L (ref 98–111)
Creatinine, Ser: 1.42 mg/dL — ABNORMAL HIGH (ref 0.61–1.24)
GFR, Estimated: 46 mL/min — ABNORMAL LOW (ref >60)
Glucose, Bld: 76 mg/dL (ref 70–99)
Potassium: 4.2 mmol/L (ref 3.5–5.1)
Sodium: 138 mmol/L (ref 135–145)
Total Bilirubin: 0.8 mg/dL (ref 0.3–1.2)
Total Protein: 6.8 g/dL (ref 6.5–8.1)

## 2020-03-31 MED ORDER — HEPARIN SOD (PORK) LOCK FLUSH 100 UNIT/ML IV SOLN
INTRAVENOUS | Status: AC
  Filled 2020-03-31: qty 5

## 2020-03-31 MED ORDER — SODIUM CHLORIDE 0.9 % IV SOLN
200.0000 mg | Freq: Once | INTRAVENOUS | Status: AC
  Administered 2020-03-31: 200 mg via INTRAVENOUS
  Filled 2020-03-31: qty 8

## 2020-03-31 MED ORDER — SODIUM CHLORIDE 0.9% FLUSH
10.0000 mL | Freq: Once | INTRAVENOUS | Status: DC
  Filled 2020-03-31: qty 10

## 2020-03-31 MED ORDER — HEPARIN SOD (PORK) LOCK FLUSH 100 UNIT/ML IV SOLN
500.0000 [IU] | Freq: Once | INTRAVENOUS | Status: AC
  Administered 2020-03-31: 500 [IU] via INTRAVENOUS
  Filled 2020-03-31: qty 5

## 2020-03-31 MED ORDER — SODIUM CHLORIDE 0.9 % IV SOLN
Freq: Once | INTRAVENOUS | Status: AC
  Filled 2020-03-31: qty 250

## 2020-03-31 NOTE — Progress Notes (Signed)
Hematology/Oncology Consult note [facility]  [phone] [phone]  Patient Care Team: Maryland Pink, MD as PCP - General (Family Medicine)   Name of the patient: Omar Schroeder  [id number]  04-11-1924   Date of visit: 03/31/20  Diagnosis- stage IV metastatic malignant melanoma with lung and liver metastases  Chief complaint/ Reason for visit-on treatment assessment prior to cycle 14 of palliative Keytruda  Heme/Onc history: Patient is an [age] year old male who lives alone and is independent of his ADLs. He has no significant comorbidities other than hyperlipidemia and GERD. He has a history of superficial skin cancer. He presented to the ER with symptoms of right-sided flank pain and had a CT renal stone study which showed a 2.1 x 1.1 cm right lower lobe pulmonary nodule. Also noted to have multiple new ill-defined lesions throughout the right lobe of the liver largest measuring 3.4 x 2.8 cm. Spleen and pancreas was unremarkable. Low-attenuation lesion in the right kidney 2.6 x 1.2 cm likely representing a cyst. No intra-abdominal adenopathy. 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  Single agent Beryle Flock started in June 2021  Interval history-patient reports doing well and denies any complaints at this time.  Denies any chest pain shortness of breath, aches and pains anywhere  ECOG PS- 1 Pain scale- 0   Review of systems- Review of Systems  Constitutional: Negative for chills,  fever, malaise/fatigue and weight loss.  HENT: Negative for congestion, ear discharge and nosebleeds.   Eyes: Negative for blurred vision.  Respiratory: Negative for cough, hemoptysis, sputum production, shortness of breath and wheezing.   Cardiovascular: Negative for chest pain, palpitations, orthopnea and claudication.  Gastrointestinal: Negative for abdominal pain, blood in stool, constipation, diarrhea, heartburn, melena, nausea and vomiting.  Genitourinary: Negative for dysuria, flank pain, frequency, hematuria and urgency.  Musculoskeletal: Negative for back pain, joint pain and myalgias.  Skin: Negative for rash.  Neurological: Negative for dizziness, tingling, focal weakness, seizures, weakness and headaches.  Endo/Heme/Allergies: Does not bruise/bleed easily.  Psychiatric/Behavioral: Negative for depression and suicidal ideas. The patient does not have insomnia.       No Known Allergies   Past Medical History:  Diagnosis Date  . Melanoma [facility])      Past Surgical History:  Procedure Laterality Date  . CATARACT EXTRACTION Bilateral 2000  . HERNIA REPAIR    . MELANOMA EXCISION    . PORTA CATH INSERTION N/A 06/28/2019   Procedure: PORTA CATH INSERTION;  Surgeon: Algernon Huxley, MD;  Location: [place] [facility];  Service: Cardiovascular;  Laterality: N/A;    Social History   Socioeconomic History  . Marital status: Widowed    Spouse name: Not on file  . Number of children: Not on file  . Years of education: Not on file  . Highest education level: Not on file  Occupational History  . Not on file  Tobacco Use  . Smoking status: Never Smoker  . Smokeless tobacco: Never Used  Vaping Use  . Vaping Use: Never used  Substance and Sexual Activity  . Alcohol use:  Never  . Drug use: Not Currently  . Sexual activity: Not Currently  Other Topics Concern  . Not on file  Social History Narrative   Lives at home by himself    Social Determinants of Health   Financial  Resource Strain: Not on file  Food Insecurity: Not on file  Transportation Needs: Not on file  Physical Activity: Not on file  Stress: Not on file  Social Connections: Not on file  Intimate Partner Violence: Not on file    History reviewed. No pertinent family history.   Current Outpatient Medications:  .  acetaminophen (TYLENOL) 325 MG tablet, Take 650 mg by mouth every 6 (six) hours as needed. , Disp: , Rfl:  .  Loratadine 10 MG CAPS, Take 1 capsule by mouth daily as needed. , Disp: , Rfl:  .  lidocaine-prilocaine (EMLA) cream, Apply to affected area once (Patient not taking: No sig reported), Disp: 30 g, Rfl: 3 .  ondansetron (ZOFRAN) 8 MG tablet, Take 1 tablet (8 mg total) by mouth 2 (two) times daily as needed (Nausea or vomiting). (Patient not taking: No sig reported), Disp: 30 tablet, Rfl: 1 .  prochlorperazine (COMPAZINE) 10 MG tablet, Take 1 tablet (10 mg total) by mouth every 6 (six) hours as needed (Nausea or vomiting). (Patient not taking: No sig reported), Disp: 30 tablet, Rfl: 1 .  traMADol (ULTRAM) 50 MG tablet, TAKE 1 TABLET (50 MG TOTAL) BY MOUTH EVERY 6 (SIX) HOURS AS NEEDED. (Patient not taking: No sig reported), Disp: 60 tablet, Rfl: 0 No current facility-administered medications for this visit.  Facility-Administered Medications Ordered in Other Visits:  .  sodium chloride flush (NS) 0.9 % injection 10 mL, 10 mL, Intravenous, Once, Sindy Guadeloupe, MD  Physical exam:  Vitals:   03/31/20 1052  BP: (!) 135/54  Pulse: (!) 35  Resp: 20  Temp: (!) 97.4 F (36.3 C)  TempSrc: Tympanic  SpO2: 98%  Weight: 176 lb (79.8 kg)   Physical Exam Constitutional:      General: He is not in acute distress. Cardiovascular:     Rate and Rhythm: Normal rate. Rhythm irregular.     Heart sounds: Normal heart sounds.     Comments: Premature beats Pulmonary:     Effort: Pulmonary effort is normal.     Breath sounds: Normal breath sounds.  Skin:    General: Skin is warm and  dry.  Neurological:     Mental Status: He is alert and oriented to person, place, and time.      CMP Latest Ref Rng & Units 03/31/2020  Glucose 70 - 99 mg/dL 76  BUN 8 - 23 mg/dL 23  Creatinine 0.61 - 1.24 mg/dL 1.42(H)  Sodium 135 - 145 mmol/L 138  Potassium 3.5 - 5.1 mmol/L 4.2  Chloride 98 - 111 mmol/L 105  CO2 22 - 32 mmol/L 24  Calcium 8.9 - 10.3 mg/dL 8.5(L)  Total Protein 6.5 - 8.1 g/dL 6.8  Total Bilirubin 0.3 - 1.2 mg/dL 0.8  Alkaline Phos 38 - 126 U/L 104  AST 15 - 41 U/L 20  ALT 0 - 44 U/L 13   CBC Latest Ref Rng & Units 03/31/2020  WBC 4.0 - 10.5 K/uL 8.0  Hemoglobin 13.0 - 17.0 g/dL 13.3  Hematocrit 39.0 - 52.0 % 40.7  Platelets 150 - 400 K/uL 152     Assessment and plan- Patient is a 87 y.o. male with metastatic malignant melanoma with liver and lung metastases here for on  treatment assessment prior to next cycle of Keytruda.  Counts okay to proceed with cycle 14 of Keytruda today.  I will see him back in 3 weeks for cycle 15.  Plan to get repeat scans sometime in April 2022.  Based on today's exam he has some PVCs but his heart rate fluctuates overall between 50s to 60s.  On manual check his heart rate was not 35.  He is asymptomatic.  Continue to monitor   Visit Diagnosis 1. Metastatic melanoma ([place])   2. Encounter for antineoplastic immunotherapy      Dr. Randa Evens, MD, MPH [facility] at [facility] [phone] 03/31/2020 1:14 PM

## 2020-04-21 ENCOUNTER — Encounter: Payer: Self-pay | Admitting: Oncology

## 2020-04-21 ENCOUNTER — Inpatient Hospital Stay (HOSPITAL_BASED_OUTPATIENT_CLINIC_OR_DEPARTMENT_OTHER): Payer: Medicare Other | Admitting: Oncology

## 2020-04-21 ENCOUNTER — Inpatient Hospital Stay: Payer: Medicare Other

## 2020-04-21 ENCOUNTER — Inpatient Hospital Stay: Payer: Medicare Other | Attending: Oncology

## 2020-04-21 VITALS — BP 140/49 | HR 65 | Temp 97.7°F | Resp 16 | Ht 69.0 in | Wt 175.4 lb

## 2020-04-21 DIAGNOSIS — C439 Malignant melanoma of skin, unspecified: Secondary | ICD-10-CM

## 2020-04-21 DIAGNOSIS — C7800 Secondary malignant neoplasm of unspecified lung: Secondary | ICD-10-CM | POA: Insufficient documentation

## 2020-04-21 DIAGNOSIS — Z87442 Personal history of urinary calculi: Secondary | ICD-10-CM | POA: Diagnosis not present

## 2020-04-21 DIAGNOSIS — K219 Gastro-esophageal reflux disease without esophagitis: Secondary | ICD-10-CM | POA: Diagnosis not present

## 2020-04-21 DIAGNOSIS — Z5112 Encounter for antineoplastic immunotherapy: Secondary | ICD-10-CM | POA: Diagnosis not present

## 2020-04-21 DIAGNOSIS — C799 Secondary malignant neoplasm of unspecified site: Secondary | ICD-10-CM | POA: Diagnosis not present

## 2020-04-21 DIAGNOSIS — Z79899 Other long term (current) drug therapy: Secondary | ICD-10-CM | POA: Insufficient documentation

## 2020-04-21 DIAGNOSIS — N400 Enlarged prostate without lower urinary tract symptoms: Secondary | ICD-10-CM | POA: Insufficient documentation

## 2020-04-21 DIAGNOSIS — N189 Chronic kidney disease, unspecified: Secondary | ICD-10-CM | POA: Diagnosis not present

## 2020-04-21 DIAGNOSIS — Z85828 Personal history of other malignant neoplasm of skin: Secondary | ICD-10-CM | POA: Diagnosis not present

## 2020-04-21 DIAGNOSIS — C787 Secondary malignant neoplasm of liver and intrahepatic bile duct: Secondary | ICD-10-CM | POA: Insufficient documentation

## 2020-04-21 DIAGNOSIS — E785 Hyperlipidemia, unspecified: Secondary | ICD-10-CM | POA: Diagnosis not present

## 2020-04-21 LAB — CBC WITH DIFFERENTIAL/PLATELET
Abs Immature Granulocytes: 0.02 10*3/uL (ref 0.00–0.07)
Basophils Absolute: 0 10*3/uL (ref 0.0–0.1)
Basophils Relative: 0 %
Eosinophils Absolute: 0.3 10*3/uL (ref 0.0–0.5)
Eosinophils Relative: 4 %
HCT: 42.1 % (ref 39.0–52.0)
Hemoglobin: 13.8 g/dL (ref 13.0–17.0)
Immature Granulocytes: 0 %
Lymphocytes Relative: 30 %
Lymphs Abs: 2.3 10*3/uL (ref 0.7–4.0)
MCH: 30.5 pg (ref 26.0–34.0)
MCHC: 32.8 g/dL (ref 30.0–36.0)
MCV: 93.1 fL (ref 80.0–100.0)
Monocytes Absolute: 0.6 10*3/uL (ref 0.1–1.0)
Monocytes Relative: 8 %
Neutro Abs: 4.4 10*3/uL (ref 1.7–7.7)
Neutrophils Relative %: 58 %
Platelets: 171 10*3/uL (ref 150–400)
RBC: 4.52 MIL/uL (ref 4.22–5.81)
RDW: 12.4 % (ref 11.5–15.5)
WBC: 7.5 10*3/uL (ref 4.0–10.5)
nRBC: 0 % (ref 0.0–0.2)

## 2020-04-21 LAB — COMPREHENSIVE METABOLIC PANEL
ALT: 12 U/L (ref 0–44)
AST: 20 U/L (ref 15–41)
Albumin: 4 g/dL (ref 3.5–5.0)
Alkaline Phosphatase: 105 U/L (ref 38–126)
Anion gap: 6 (ref 5–15)
BUN: 23 mg/dL (ref 8–23)
CO2: 25 mmol/L (ref 22–32)
Calcium: 8.7 mg/dL — ABNORMAL LOW (ref 8.9–10.3)
Chloride: 106 mmol/L (ref 98–111)
Creatinine, Ser: 1.42 mg/dL — ABNORMAL HIGH (ref 0.61–1.24)
GFR, Estimated: 46 mL/min — ABNORMAL LOW (ref >60)
Glucose, Bld: 86 mg/dL (ref 70–99)
Potassium: 4.3 mmol/L (ref 3.5–5.1)
Sodium: 137 mmol/L (ref 135–145)
Total Bilirubin: 0.9 mg/dL (ref 0.3–1.2)
Total Protein: 6.9 g/dL (ref 6.5–8.1)

## 2020-04-21 LAB — TSH: TSH: 4.263 u[IU]/mL (ref 0.350–4.500)

## 2020-04-21 MED ORDER — SODIUM CHLORIDE 0.9% FLUSH
10.0000 mL | INTRAVENOUS | Status: DC | PRN
  Administered 2020-04-21: 10 mL via INTRAVENOUS
  Filled 2020-04-21: qty 10

## 2020-04-21 MED ORDER — SODIUM CHLORIDE 0.9 % IV SOLN
Freq: Once | INTRAVENOUS | Status: AC
  Filled 2020-04-21: qty 250

## 2020-04-21 MED ORDER — HEPARIN SOD (PORK) LOCK FLUSH 100 UNIT/ML IV SOLN
INTRAVENOUS | Status: AC
  Filled 2020-04-21: qty 5

## 2020-04-21 MED ORDER — HEPARIN SOD (PORK) LOCK FLUSH 100 UNIT/ML IV SOLN
500.0000 [IU] | Freq: Once | INTRAVENOUS | Status: DC | PRN
  Filled 2020-04-21: qty 5

## 2020-04-21 MED ORDER — HEPARIN SOD (PORK) LOCK FLUSH 100 UNIT/ML IV SOLN
500.0000 [IU] | Freq: Once | INTRAVENOUS | Status: AC
  Administered 2020-04-21: 500 [IU] via INTRAVENOUS
  Filled 2020-04-21: qty 5

## 2020-04-21 MED ORDER — SODIUM CHLORIDE 0.9 % IV SOLN
200.0000 mg | Freq: Once | INTRAVENOUS | Status: AC
  Administered 2020-04-21: 200 mg via INTRAVENOUS
  Filled 2020-04-21: qty 8

## 2020-04-21 NOTE — Progress Notes (Signed)
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 He has no significant comorbidities other than hyperlipidemia and GERD. He has a history of superficial skin cancer. He presented to the ER with symptoms of right-sided flank pain and had a CT renal stone study which showed a 2.1 x 1.1 cm right lower lobe pulmonary nodule. Also noted to have multiple new ill-defined lesions throughout the right lobe of the liver largest measuring 3.4 x 2.8 cm. Spleen and pancreas was unremarkable. Low-attenuation lesion in the right kidney 2.6 x 1.2 cm likely representing a cyst. No intra-abdominal adenopathy. 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  Single agent Beryle Flock started in June 2021  Interval history-patient reports doing well and denies any complaints at this time.  He remains independent of his ADLs and IADLs.  ECOG PS- 1 Pain scale- 0  Review of systems- Review of Systems  Constitutional: Negative for chills, fever, malaise/fatigue and  weight loss.  HENT: Negative for congestion, ear discharge and nosebleeds.   Eyes: Negative for blurred vision.  Respiratory: Negative for cough, hemoptysis, sputum production, shortness of breath and wheezing.   Cardiovascular: Negative for chest pain, palpitations, orthopnea and claudication.  Gastrointestinal: Negative for abdominal pain, blood in stool, constipation, diarrhea, heartburn, melena, nausea and vomiting.  Genitourinary: Negative for dysuria, flank pain, frequency, hematuria and urgency.  Musculoskeletal: Negative for back pain, joint pain and myalgias.  Skin: Negative for rash.  Neurological: Negative for dizziness, tingling, focal weakness, seizures, weakness and headaches.  Endo/Heme/Allergies: Does not bruise/bleed easily.  Psychiatric/Behavioral: Negative for depression and suicidal ideas. The patient does not have insomnia.        No Known Allergies   Past Medical History:  Diagnosis Date  . Melanoma [facility])      Past Surgical History:  Procedure Laterality Date  . CATARACT EXTRACTION Bilateral 2000  . HERNIA REPAIR    . MELANOMA EXCISION    . PORTA CATH INSERTION N/A 06/28/2019   Procedure: PORTA CATH INSERTION;  Surgeon: Algernon Huxley, MD;  Location: [facility];  Service: Cardiovascular;  Laterality: N/A;    Social History   Socioeconomic History  . Marital status: Widowed    Spouse name: Not on file  . Number of children: Not on file  . Years of education: Not on file  . Highest education level: Not on file  Occupational History  . Not on file  Tobacco Use  . Smoking status: Never Smoker  . Smokeless tobacco: Never Used  Vaping Use  . Vaping Use: Never used  Substance and Sexual Activity  . Alcohol use: Never  .  Drug use: Not Currently  . Sexual activity: Not Currently  Other Topics Concern  . Not on file  Social History Narrative   Lives at home by himself    Social Determinants of Health   Financial Resource Strain: Not on  file  Food Insecurity: Not on file  Transportation Needs: Not on file  Physical Activity: Not on file  Stress: Not on file  Social Connections: Not on file  Intimate Partner Violence: Not on file    History reviewed. No pertinent family history.   Current Outpatient Medications:  .  acetaminophen (TYLENOL) 325 MG tablet, Take 650 mg by mouth every 6 (six) hours as needed. , Disp: , Rfl:  .  Loratadine 10 MG CAPS, Take 1 capsule by mouth daily as needed. , Disp: , Rfl:  .  traMADol (ULTRAM) 50 MG tablet, TAKE 1 TABLET (50 MG TOTAL) BY MOUTH EVERY 6 (SIX) HOURS AS NEEDED., Disp: 60 tablet, Rfl: 0 .  lidocaine-prilocaine (EMLA) cream, Apply to affected area once (Patient not taking: No sig reported), Disp: 30 g, Rfl: 3 .  ondansetron (ZOFRAN) 8 MG tablet, Take 1 tablet (8 mg total) by mouth 2 (two) times daily as needed (Nausea or vomiting). (Patient not taking: No sig reported), Disp: 30 tablet, Rfl: 1 .  prochlorperazine (COMPAZINE) 10 MG tablet, Take 1 tablet (10 mg total) by mouth every 6 (six) hours as needed (Nausea or vomiting). (Patient not taking: No sig reported), Disp: 30 tablet, Rfl: 1 No current facility-administered medications for this visit.  Facility-Administered Medications Ordered in Other Visits:  .  heparin lock flush 100 unit/mL, 500 Units, Intracatheter, Once PRN, Sindy Guadeloupe, MD .  sodium chloride flush (NS) 0.9 % injection 10 mL, 10 mL, Intravenous, PRN, Sindy Guadeloupe, MD, 10 mL at 04/21/20 1034  Physical exam:  Vitals:   04/21/20 1121  BP: (!) 140/49  Pulse: 65  Resp: 16  Temp: 97.7 F (36.5 C)  TempSrc: Oral  Weight: 175 lb 6.4 oz (79.6 kg)  Height: '5\' 9"'  (1.753 m)   Physical Exam Constitutional:      General: He is not in acute distress. Cardiovascular:     Rate and Rhythm: Normal rate and regular rhythm.     Heart sounds: Normal heart sounds.  Pulmonary:     Effort: Pulmonary effort is normal.     Breath sounds: Normal breath sounds.   Skin:    General: Skin is warm and dry.  Neurological:     Mental Status: He is alert and oriented to person, place, and time.      CMP Latest Ref Rng & Units 04/21/2020  Glucose 70 - 99 mg/dL 86  BUN 8 - 23 mg/dL 23  Creatinine 0.61 - 1.24 mg/dL 1.42(H)  Sodium 135 - 145 mmol/L 137  Potassium 3.5 - 5.1 mmol/L 4.3  Chloride 98 - 111 mmol/L 106  CO2 22 - 32 mmol/L 25  Calcium 8.9 - 10.3 mg/dL 8.7(L)  Total Protein 6.5 - 8.1 g/dL 6.9  Total Bilirubin 0.3 - 1.2 mg/dL 0.9  Alkaline Phos 38 - 126 U/L 105  AST 15 - 41 U/L 20  ALT 0 - 44 U/L 12   CBC Latest Ref Rng & Units 04/21/2020  WBC 4.0 - 10.5 K/uL 7.5  Hemoglobin 13.0 - 17.0 g/dL 13.8  Hematocrit 39.0 - 52.0 % 42.1  Platelets 150 - 400 K/uL 171      Assessment and plan- Patient is a 87 y.o. male with  metastatic malignant melanoma with liver and lung metastases.  He is here for on treatment assessment prior to next cycle of Keytruda  Counts okay to proceed with cycle 15 of Keytruda today.  I will see him back in 3 weeks for cycle 15.  Plan to repeat CT chest abdomen pelvis with contrast in 2 weeks.  Tolerating treatment well without any significant side effects so far.  Plan is to continue treatment until progression or toxicity   Visit Diagnosis 1. Metastatic melanoma ([place])   2. Encounter for antineoplastic immunotherapy      Dr. Randa Evens, MD, MPH [facility] [phone] 04/21/2020 1:33 PM

## 2020-04-21 NOTE — Progress Notes (Signed)
Pt doing good, no concerns. °

## 2020-05-09 ENCOUNTER — Ambulatory Visit
Admission: RE | Admit: 2020-05-09 | Discharge: 2020-05-09 | Disposition: A | Discharge status: Home / Self Care | Payer: Medicare Other | Source: Ambulatory Visit | Attending: Oncology | Admitting: Oncology

## 2020-05-09 ENCOUNTER — Other Ambulatory Visit: Payer: Self-pay

## 2020-05-09 DIAGNOSIS — N138 Other obstructive and reflux uropathy: Secondary | ICD-10-CM | POA: Insufficient documentation

## 2020-05-09 DIAGNOSIS — I700 Atherosclerosis of aorta: Secondary | ICD-10-CM | POA: Insufficient documentation

## 2020-05-09 DIAGNOSIS — C787 Secondary malignant neoplasm of liver and intrahepatic bile duct: Secondary | ICD-10-CM | POA: Diagnosis not present

## 2020-05-09 DIAGNOSIS — N401 Enlarged prostate with lower urinary tract symptoms: Secondary | ICD-10-CM | POA: Insufficient documentation

## 2020-05-09 DIAGNOSIS — C439 Malignant melanoma of skin, unspecified: Secondary | ICD-10-CM

## 2020-05-09 DIAGNOSIS — C799 Secondary malignant neoplasm of unspecified site: Secondary | ICD-10-CM | POA: Insufficient documentation

## 2020-05-09 MED ORDER — IOHEXOL 300 MG/ML  SOLN
80.0000 mL | Freq: Once | INTRAMUSCULAR | Status: AC | PRN
  Administered 2020-05-09: 80 mL via INTRAVENOUS

## 2020-05-12 ENCOUNTER — Encounter: Payer: Self-pay | Admitting: Oncology

## 2020-05-12 ENCOUNTER — Inpatient Hospital Stay: Payer: Medicare Other | Attending: Oncology

## 2020-05-12 ENCOUNTER — Inpatient Hospital Stay: Payer: Medicare Other

## 2020-05-12 ENCOUNTER — Inpatient Hospital Stay (HOSPITAL_BASED_OUTPATIENT_CLINIC_OR_DEPARTMENT_OTHER): Payer: Medicare Other | Admitting: Oncology

## 2020-05-12 VITALS — BP 118/53 | HR 63 | Temp 97.1°F | Resp 20 | Wt 176.1 lb

## 2020-05-12 DIAGNOSIS — Z5112 Encounter for antineoplastic immunotherapy: Secondary | ICD-10-CM | POA: Insufficient documentation

## 2020-05-12 DIAGNOSIS — N189 Chronic kidney disease, unspecified: Secondary | ICD-10-CM | POA: Diagnosis not present

## 2020-05-12 DIAGNOSIS — I700 Atherosclerosis of aorta: Secondary | ICD-10-CM | POA: Insufficient documentation

## 2020-05-12 DIAGNOSIS — M4317 Spondylolisthesis, lumbosacral region: Secondary | ICD-10-CM | POA: Diagnosis not present

## 2020-05-12 DIAGNOSIS — C787 Secondary malignant neoplasm of liver and intrahepatic bile duct: Secondary | ICD-10-CM | POA: Diagnosis not present

## 2020-05-12 DIAGNOSIS — C799 Secondary malignant neoplasm of unspecified site: Secondary | ICD-10-CM

## 2020-05-12 DIAGNOSIS — C439 Malignant melanoma of skin, unspecified: Secondary | ICD-10-CM

## 2020-05-12 DIAGNOSIS — C7800 Secondary malignant neoplasm of unspecified lung: Secondary | ICD-10-CM | POA: Insufficient documentation

## 2020-05-12 DIAGNOSIS — Z85828 Personal history of other malignant neoplasm of skin: Secondary | ICD-10-CM | POA: Diagnosis not present

## 2020-05-12 DIAGNOSIS — M8580 Other specified disorders of bone density and structure, unspecified site: Secondary | ICD-10-CM | POA: Diagnosis not present

## 2020-05-12 DIAGNOSIS — N400 Enlarged prostate without lower urinary tract symptoms: Secondary | ICD-10-CM | POA: Diagnosis not present

## 2020-05-12 DIAGNOSIS — E785 Hyperlipidemia, unspecified: Secondary | ICD-10-CM | POA: Diagnosis not present

## 2020-05-12 DIAGNOSIS — K5730 Diverticulosis of large intestine without perforation or abscess without bleeding: Secondary | ICD-10-CM | POA: Diagnosis not present

## 2020-05-12 DIAGNOSIS — K219 Gastro-esophageal reflux disease without esophagitis: Secondary | ICD-10-CM | POA: Insufficient documentation

## 2020-05-12 LAB — COMPREHENSIVE METABOLIC PANEL
ALT: 13 U/L (ref 0–44)
AST: 22 U/L (ref 15–41)
Albumin: 3.8 g/dL (ref 3.5–5.0)
Alkaline Phosphatase: 103 U/L (ref 38–126)
Anion gap: 9 (ref 5–15)
BUN: 23 mg/dL (ref 8–23)
CO2: 25 mmol/L (ref 22–32)
Calcium: 8.8 mg/dL — ABNORMAL LOW (ref 8.9–10.3)
Chloride: 105 mmol/L (ref 98–111)
Creatinine, Ser: 1.44 mg/dL — ABNORMAL HIGH (ref 0.61–1.24)
GFR, Estimated: 45 mL/min — ABNORMAL LOW (ref >60)
Glucose, Bld: 85 mg/dL (ref 70–99)
Potassium: 4.4 mmol/L (ref 3.5–5.1)
Sodium: 139 mmol/L (ref 135–145)
Total Bilirubin: 0.6 mg/dL (ref 0.3–1.2)
Total Protein: 6.8 g/dL (ref 6.5–8.1)

## 2020-05-12 LAB — CBC WITH DIFFERENTIAL/PLATELET
Abs Immature Granulocytes: 0.02 10*3/uL (ref 0.00–0.07)
Basophils Absolute: 0 10*3/uL (ref 0.0–0.1)
Basophils Relative: 1 %
Eosinophils Absolute: 0.3 10*3/uL (ref 0.0–0.5)
Eosinophils Relative: 4 %
HCT: 40.4 % (ref 39.0–52.0)
Hemoglobin: 13.2 g/dL (ref 13.0–17.0)
Immature Granulocytes: 0 %
Lymphocytes Relative: 25 %
Lymphs Abs: 2.1 10*3/uL (ref 0.7–4.0)
MCH: 30.6 pg (ref 26.0–34.0)
MCHC: 32.7 g/dL (ref 30.0–36.0)
MCV: 93.7 fL (ref 80.0–100.0)
Monocytes Absolute: 0.6 10*3/uL (ref 0.1–1.0)
Monocytes Relative: 8 %
Neutro Abs: 5.3 10*3/uL (ref 1.7–7.7)
Neutrophils Relative %: 62 %
Platelets: 190 10*3/uL (ref 150–400)
RBC: 4.31 MIL/uL (ref 4.22–5.81)
RDW: 12.5 % (ref 11.5–15.5)
WBC: 8.4 10*3/uL (ref 4.0–10.5)
nRBC: 0 % (ref 0.0–0.2)

## 2020-05-12 MED ORDER — HEPARIN SOD (PORK) LOCK FLUSH 100 UNIT/ML IV SOLN
500.0000 [IU] | Freq: Once | INTRAVENOUS | Status: AC
  Administered 2020-05-12: 500 [IU] via INTRAVENOUS
  Filled 2020-05-12: qty 5

## 2020-05-12 MED ORDER — SODIUM CHLORIDE 0.9 % IV SOLN
200.0000 mg | Freq: Once | INTRAVENOUS | Status: AC
  Administered 2020-05-12: 200 mg via INTRAVENOUS
  Filled 2020-05-12: qty 8

## 2020-05-12 MED ORDER — SODIUM CHLORIDE 0.9% FLUSH
10.0000 mL | Freq: Once | INTRAVENOUS | Status: AC
  Administered 2020-05-12: 10 mL via INTRAVENOUS
  Filled 2020-05-12: qty 10

## 2020-05-12 MED ORDER — SODIUM CHLORIDE 0.9 % IV SOLN
Freq: Once | INTRAVENOUS | Status: AC
Start: 2020-05-12 — End: 2020-05-12
  Filled 2020-05-12: qty 250

## 2020-05-12 NOTE — Progress Notes (Signed)
Patient denies any concerns today.  

## 2020-05-12 NOTE — Patient Instructions (Signed)
[facility] [place] [facility]  Discharge Instructions: Thank you for choosing [facility] to provide your oncology and hematology care.  If you have a lab appointment with the Cancer Center, please go directly to the Cancer Center and check in at the registration area.  Wear comfortable clothing and clothing appropriate for easy access to any Portacath or PICC line.   We strive to give you quality time with your provider. You may need to reschedule your appointment if you arrive late (15 or more minutes).  Arriving late affects you and other patients whose appointments are after yours.  Also, if you miss three or more appointments without notifying the office, you may be dismissed from the clinic at the provider's discretion.      For prescription refill requests, have your pharmacy contact our office and allow 72 hours for refills to be completed.    Today you received the following chemotherapy and/or immunotherapy agents :Keytruda      To help prevent nausea and vomiting after your treatment, we encourage you to take your nausea medication as directed.  BELOW ARE SYMPTOMS THAT SHOULD BE REPORTED IMMEDIATELY: . *FEVER GREATER THAN 100.4 F (38 C) OR HIGHER . *CHILLS OR SWEATING . *NAUSEA AND VOMITING THAT IS NOT CONTROLLED WITH YOUR NAUSEA MEDICATION . *UNUSUAL SHORTNESS OF BREATH . *UNUSUAL BRUISING OR BLEEDING . *URINARY PROBLEMS (pain or burning when urinating, or frequent urination) . *BOWEL PROBLEMS (unusual diarrhea, constipation, pain near the anus) . TENDERNESS IN MOUTH AND THROAT WITH OR WITHOUT PRESENCE OF ULCERS (sore throat, sores in mouth, or a toothache) . UNUSUAL RASH, SWELLING OR PAIN  . UNUSUAL VAGINAL DISCHARGE OR ITCHING   Items with * indicate a potential emergency and should be followed up as soon as possible or go to the Emergency Department if any problems should occur.  Please show the CHEMOTHERAPY ALERT CARD or IMMUNOTHERAPY  ALERT CARD at check-in to the Emergency Department and triage nurse.  Should you have questions after your visit or need to cancel or reschedule your appointment, please contact [facility] [facility]  [phone] and follow the prompts.  Office hours are 8:00 a.m. to 4:30 p.m. Monday - Friday. Please note that voicemails left after 4:00 p.m. may not be returned until the following business day.  We are closed weekends and major holidays. You have access to a nurse at all times for urgent questions. Please call the main number to the clinic [phone] and follow the prompts.  For any non-urgent questions, you may also contact your provider using MyChart. We now offer e-Visits for anyone 18 and older to request care online for non-urgent symptoms. For details visit [email or web address].   Also download the MyChart app! Go to the app store, search "MyChart", open the app, select [facility], and log in with your MyChart username and password.  Due to Covid, a mask is required upon entering the hospital/clinic. If you do not have a mask, one will be given to you upon arrival. For doctor visits, patients may have 1 support person aged 18 or older with them. For treatment visits, patients cannot have anyone with them due to current Covid guidelines and our immunocompromised population.   Pembrolizumab injection What is this medicine? PEMBROLIZUMAB (pem broe liz ue mab) is a monoclonal antibody. It is used to treat certain types of cancer. This medicine may be used for other purposes; ask your health care provider or pharmacist if you have questions. COMMON   BRAND NAME(S): Keytruda What should I tell my health care provider before I take this medicine? They need to know if you have any of these conditions:  autoimmune diseases like Crohn's disease, ulcerative colitis, or lupus  have had or planning to have an allogeneic stem cell transplant (uses someone else's stem  cells)  history of organ transplant  history of chest radiation  nervous system problems like myasthenia gravis or Guillain-Barre syndrome  an unusual or allergic reaction to pembrolizumab, other medicines, foods, dyes, or preservatives  pregnant or trying to get pregnant  breast-feeding How should I use this medicine? This medicine is for infusion into a vein. It is given by a health care professional in a hospital or clinic setting. A special MedGuide will be given to you before each treatment. Be sure to read this information carefully each time. Talk to your pediatrician regarding the use of this medicine in children. While this drug may be prescribed for children as young as 6 months for selected conditions, precautions do apply. Overdosage: If you think you have taken too much of this medicine contact a poison control center or emergency room at once. NOTE: This medicine is only for you. Do not share this medicine with others. What if I miss a dose? It is important not to miss your dose. Call your doctor or health care professional if you are unable to keep an appointment. What may interact with this medicine? Interactions have not been studied. This list may not describe all possible interactions. Give your health care provider a list of all the medicines, herbs, non-prescription drugs, or dietary supplements you use. Also tell them if you smoke, drink alcohol, or use illegal drugs. Some items may interact with your medicine. What should I watch for while using this medicine? Your condition will be monitored carefully while you are receiving this medicine. You may need blood work done while you are taking this medicine. Do not become pregnant while taking this medicine or for 4 months after stopping it. Women should inform their doctor if they wish to become pregnant or think they might be pregnant. There is a potential for serious side effects to an unborn child. Talk to your  health care professional or pharmacist for more information. Do not breast-feed an infant while taking this medicine or for 4 months after the last dose. What side effects may I notice from receiving this medicine? Side effects that you should report to your doctor or health care professional as soon as possible:  allergic reactions like skin rash, itching or hives, swelling of the face, lips, or tongue  bloody or black, tarry  breathing problems  changes in vision  chest pain  chills  confusion  constipation  cough  diarrhea  dizziness or feeling faint or lightheaded  fast or irregular heartbeat  fever  flushing  joint pain  low blood counts - this medicine may decrease the number of white blood cells, red blood cells and platelets. You may be at increased risk for infections and bleeding.  muscle pain  muscle weakness  pain, tingling, numbness in the hands or feet  persistent headache  redness, blistering, peeling or loosening of the skin, including inside the mouth  signs and symptoms of high blood sugar such as dizziness; dry mouth; dry skin; fruity breath; nausea; stomach pain; increased hunger or thirst; increased urination  signs and symptoms of kidney injury like trouble passing urine or change in the amount of urine  signs and   symptoms of liver injury like dark urine, light-colored stools, loss of appetite, nausea, right upper belly pain, yellowing of the eyes or skin  sweating  swollen lymph nodes  weight loss Side effects that usually do not require medical attention (report to your doctor or health care professional if they continue or are bothersome):  decreased appetite  hair loss  tiredness This list may not describe all possible side effects. Call your doctor for medical advice about side effects. You may report side effects to FDA at 1-800-FDA-1088. Where should I keep my medicine? This drug is given in a hospital or clinic and will  not be stored at home. NOTE: This sheet is a summary. It may not cover all possible information. If you have questions about this medicine, talk to your doctor, pharmacist, or health care provider.  2021 Elsevier/Gold Standard (2018-11-29 21:44:53)  

## 2020-05-12 NOTE — Progress Notes (Signed)
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 He has no significant comorbidities other than hyperlipidemia and GERD. He has a history of superficial skin cancer. He presented to the ER with symptoms of right-sided flank pain and had a CT renal stone study which showed a 2.1 x 1.1 cm right lower lobe pulmonary nodule. Also noted to have multiple new ill-defined lesions throughout the right lobe of the liver largest measuring 3.4 x 2.8 cm. Spleen and pancreas was unremarkable. Low-attenuation lesion in the right kidney 2.6 x 1.2 cm likely representing a cyst. No intra-abdominal adenopathy. 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  Single agent Beryle Flock started in June 2021  Interval history-patient reports doing well and denies any specific complaints at this time  ECOG PS- 1 Pain scale- 0   Review of systems- Review of Systems  Constitutional: Negative for chills, fever, malaise/fatigue and weight loss.  HENT: Negative for  congestion, ear discharge and nosebleeds.   Eyes: Negative for blurred vision.  Respiratory: Negative for cough, hemoptysis, sputum production, shortness of breath and wheezing.   Cardiovascular: Negative for chest pain, palpitations, orthopnea and claudication.  Gastrointestinal: Negative for abdominal pain, blood in stool, constipation, diarrhea, heartburn, melena, nausea and vomiting.  Genitourinary: Negative for dysuria, flank pain, frequency, hematuria and urgency.  Musculoskeletal: Negative for back pain, joint pain and myalgias.  Skin: Negative for rash.  Neurological: Negative for dizziness, tingling, focal weakness, seizures, weakness and headaches.  Endo/Heme/Allergies: Does not bruise/bleed easily.  Psychiatric/Behavioral: Negative for depression and suicidal ideas. The patient does not have insomnia.      No Known Allergies   Past Medical History:  Diagnosis Date  . Melanoma [facility])      Past Surgical History:  Procedure Laterality Date  . CATARACT EXTRACTION Bilateral 2000  . HERNIA REPAIR    . MELANOMA EXCISION    . PORTA CATH INSERTION N/A 06/28/2019   Procedure: PORTA CATH INSERTION;  Surgeon: Algernon Huxley, MD;  Location: [place] [facility];  Service: Cardiovascular;  Laterality: N/A;    Social History   Socioeconomic History  . Marital status: Widowed    Spouse name: Not on file  . Number of children: Not on file  . Years of education: Not on file  . Highest education level: Not on file  Occupational History  . Not on file  Tobacco Use  . Smoking status: Never Smoker  . Smokeless tobacco: Never Used  Vaping Use  . Vaping Use: Never used  Substance and Sexual Activity  . Alcohol use: Never  . Drug use: Not Currently  . Sexual activity:  Not Currently  Other Topics Concern  . Not on file  Social History Narrative   Lives at home by himself    Social Determinants of Health   Financial Resource Strain: Not on file  Food Insecurity: Not on file   Transportation Needs: Not on file  Physical Activity: Not on file  Stress: Not on file  Social Connections: Not on file  Intimate Partner Violence: Not on file    History reviewed. No pertinent family history.   Current Outpatient Medications:  .  acetaminophen (TYLENOL) 325 MG tablet, Take 650 mg by mouth every 6 (six) hours as needed. , Disp: , Rfl:  .  Loratadine 10 MG CAPS, Take 1 capsule by mouth daily as needed. , Disp: , Rfl:  .  lidocaine-prilocaine (EMLA) cream, Apply to affected area once (Patient not taking: No sig reported), Disp: 30 g, Rfl: 3 .  ondansetron (ZOFRAN) 8 MG tablet, Take 1 tablet (8 mg total) by mouth 2 (two) times daily as needed (Nausea or vomiting). (Patient not taking: No sig reported), Disp: 30 tablet, Rfl: 1 .  prochlorperazine (COMPAZINE) 10 MG tablet, Take 1 tablet (10 mg total) by mouth every 6 (six) hours as needed (Nausea or vomiting). (Patient not taking: No sig reported), Disp: 30 tablet, Rfl: 1 .  traMADol (ULTRAM) 50 MG tablet, TAKE 1 TABLET (50 MG TOTAL) BY MOUTH EVERY 6 (SIX) HOURS AS NEEDED. (Patient not taking: Reported on 05/12/2020), Disp: 60 tablet, Rfl: 0  Physical exam:  Vitals:   05/12/20 1107  BP: (!) 118/53  Pulse: 63  Resp: 20  Temp: (!) 97.1 F (36.2 C)  TempSrc: Tympanic  Weight: 176 lb 1.6 oz (79.9 kg)   Physical Exam Constitutional:      General: He is not in acute distress. Cardiovascular:     Rate and Rhythm: Normal rate and regular rhythm.     Heart sounds: Normal heart sounds.  Pulmonary:     Effort: Pulmonary effort is normal.     Breath sounds: Normal breath sounds.  Abdominal:     General: Bowel sounds are normal.     Palpations: Abdomen is soft.  Skin:    General: Skin is warm and dry.  Neurological:     Mental Status: He is alert and oriented to person, place, and time.      CMP Latest Ref Rng & Units 05/12/2020  Glucose 70 - 99 mg/dL 85  BUN 8 - 23 mg/dL 23  Creatinine 0.61 - 1.24 mg/dL 1.44(H)   Sodium 135 - 145 mmol/L 139  Potassium 3.5 - 5.1 mmol/L 4.4  Chloride 98 - 111 mmol/L 105  CO2 22 - 32 mmol/L 25  Calcium 8.9 - 10.3 mg/dL 8.8(L)  Total Protein 6.5 - 8.1 g/dL 6.8  Total Bilirubin 0.3 - 1.2 mg/dL 0.6  Alkaline Phos 38 - 126 U/L 103  AST 15 - 41 U/L 22  ALT 0 - 44 U/L 13   CBC Latest Ref Rng & Units 05/12/2020  WBC 4.0 - 10.5 K/uL 8.4  Hemoglobin 13.0 - 17.0 g/dL 13.2  Hematocrit 39.0 - 52.0 % 40.4  Platelets 150 - 400 K/uL 190    No images are attached to the encounter.  CT CHEST ABDOMEN PELVIS W CONTRAST  Result Date: 05/09/2020 CLINICAL DATA:  Stage IV metastatic melanoma with lung and liver metastasis. On immunotherapy. Asymptomatic. EXAM: CT CHEST, ABDOMEN, AND PELVIS WITH CONTRAST TECHNIQUE: Multidetector CT imaging of the chest, abdomen and pelvis was performed following the  standard protocol during bolus administration of intravenous contrast. CONTRAST:  16m OMNIPAQUE IOHEXOL 300 MG/ML  SOLN COMPARISON:  12/31/2019 FINDINGS: CT CHEST FINDINGS Cardiovascular: Right Port-A-Cath tip at low SVC. Normal heart size, without pericardial effusion. Aortic atherosclerosis. No central pulmonary embolism, on this non-dedicated study. Mediastinum/Nodes: No supraclavicular adenopathy. No mediastinal or hilar adenopathy. Lungs/Pleura: No pleural fluid. Similar marked right hemidiaphragm elevation. Volume loss at the right lung base. 2 mm medial right apical pleural-based nodular density is similar on 43/4. Posterior right upper lobe pulmonary nodule is less well-defined today. On the order of 5 mm maximally on 71/4 versus 8 x 4 mm on the prior exam. Medial right lower lobe pulmonary nodule measures 1.3 x 1.1 cm on 108/4 versus 1.6 x 1.1 cm on the prior exam. No new or enlarging pulmonary nodules identified. Musculoskeletal: Thoracic compression deformities, most significant at T9 and T12, similar. CT ABDOMEN PELVIS FINDINGS Hepatobiliary: Index subcapsular right hepatic lobe  hypoattenuating lesion is slightly less well-defined today. On the order of 1.5 x 0.9 cm on 62/2 versus 1.8 x 0.9 cm on the prior. Central and caudal to this is a right hepatic lobe lesion which nearly has resolved. Example at 6 mm on 63/2 versus 11 x 10 mm on the prior exam. No new or enlarging liver lesions. A segment 4A cyst is again identified. Normal gallbladder, without biliary ductal dilatation. Pancreas: Normal, without mass or ductal dilatation. Spleen: Normal in size, without focal abnormality. Adrenals/Urinary Tract: Normal adrenal glands. Expected renal cortical thinning for age. An interpolar right renal 2.2 cm low-density lesion is likely a cyst. No hydronephrosis. Bladder saccules in the setting of prostatomegaly. Stomach/Bowel: Normal stomach, without wall thickening. Extensive colonic diverticulosis. Normal terminal ileum and appendix. Normal small bowel. Vascular/Lymphatic: Aortic atherosclerosis. No abdominopelvic adenopathy. Reproductive: Moderate to marked prostatomegaly. Other: No significant free fluid. No evidence of omental or peritoneal disease. Musculoskeletal: Osteopenia. Bilateral L5 pars defects. Grade 1 L5-S1 anterolisthesis. Lumbar compression deformities are similar, without gross ventral canal encroachment. IMPRESSION: 1. Response to therapy of hepatic and liver metastasis. 2. No new or progressive disease. 3. Prostatomegaly with bladder outlet obstruction. 4. Chronic marked right hemidiaphragm elevation. 5.  Aortic Atherosclerosis (ICD10-I70.0). Electronically Signed   By: KAbigail MiyamotoM.D.   On: 05/09/2020 16:03     Assessment and plan- Patient is a 87y.o. male with metastatic malignant melanoma with liver and lung metastases.  He is here for on treatment assessment prior to next cycle of Keytruda  Counts okay to proceed with cycle 16 of Keytruda today.  I will see him back in 3 weeks for cycle 17.  I have reviewed recent CT chest abdomen and pelvis with contrast which  shows continued response to treatment with interval decrease in the size of his lung lesions as well as liver lesions and no evidence of progressive disease.Tolerating Keytruda well without any significant side effects which we will continue until progression or toxicity.   Visit Diagnosis 1. Encounter for antineoplastic immunotherapy   2. [place]      Dr. ARanda Evens MD, MPH [id number]/02/2020 4:57 PM

## 2020-06-02 ENCOUNTER — Other Ambulatory Visit: Payer: Self-pay

## 2020-06-02 ENCOUNTER — Encounter: Payer: Self-pay | Admitting: Oncology

## 2020-06-02 ENCOUNTER — Inpatient Hospital Stay: Payer: Medicare Other

## 2020-06-02 ENCOUNTER — Inpatient Hospital Stay (HOSPITAL_BASED_OUTPATIENT_CLINIC_OR_DEPARTMENT_OTHER): Payer: Medicare Other | Admitting: Oncology

## 2020-06-02 VITALS — BP 139/66 | HR 103 | Temp 98.0°F | Resp 16 | Ht 69.0 in | Wt 174.8 lb

## 2020-06-02 DIAGNOSIS — C439 Malignant melanoma of skin, unspecified: Secondary | ICD-10-CM

## 2020-06-02 DIAGNOSIS — C799 Secondary malignant neoplasm of unspecified site: Secondary | ICD-10-CM | POA: Diagnosis not present

## 2020-06-02 DIAGNOSIS — Z5112 Encounter for antineoplastic immunotherapy: Secondary | ICD-10-CM

## 2020-06-02 LAB — CBC WITH DIFFERENTIAL/PLATELET
Abs Immature Granulocytes: 0.02 10*3/uL (ref 0.00–0.07)
Basophils Absolute: 0 10*3/uL (ref 0.0–0.1)
Basophils Relative: 1 %
Eosinophils Absolute: 0.3 10*3/uL (ref 0.0–0.5)
Eosinophils Relative: 3 %
HCT: 42.1 % (ref 39.0–52.0)
Hemoglobin: 13.8 g/dL (ref 13.0–17.0)
Immature Granulocytes: 0 %
Lymphocytes Relative: 26 %
Lymphs Abs: 2 10*3/uL (ref 0.7–4.0)
MCH: 30.5 pg (ref 26.0–34.0)
MCHC: 32.8 g/dL (ref 30.0–36.0)
MCV: 92.9 fL (ref 80.0–100.0)
Monocytes Absolute: 0.7 10*3/uL (ref 0.1–1.0)
Monocytes Relative: 8 %
Neutro Abs: 4.8 10*3/uL (ref 1.7–7.7)
Neutrophils Relative %: 62 %
Platelets: 183 10*3/uL (ref 150–400)
RBC: 4.53 MIL/uL (ref 4.22–5.81)
RDW: 12.6 % (ref 11.5–15.5)
WBC: 7.8 10*3/uL (ref 4.0–10.5)
nRBC: 0 % (ref 0.0–0.2)

## 2020-06-02 LAB — COMPREHENSIVE METABOLIC PANEL
ALT: 12 U/L (ref 0–44)
AST: 23 U/L (ref 15–41)
Albumin: 3.8 g/dL (ref 3.5–5.0)
Alkaline Phosphatase: 105 U/L (ref 38–126)
Anion gap: 10 (ref 5–15)
BUN: 21 mg/dL (ref 8–23)
CO2: 23 mmol/L (ref 22–32)
Calcium: 8.7 mg/dL — ABNORMAL LOW (ref 8.9–10.3)
Chloride: 105 mmol/L (ref 98–111)
Creatinine, Ser: 1.6 mg/dL — ABNORMAL HIGH (ref 0.61–1.24)
GFR, Estimated: 40 mL/min — ABNORMAL LOW (ref >60)
Glucose, Bld: 108 mg/dL — ABNORMAL HIGH (ref 70–99)
Potassium: 4.1 mmol/L (ref 3.5–5.1)
Sodium: 138 mmol/L (ref 135–145)
Total Bilirubin: 0.8 mg/dL (ref 0.3–1.2)
Total Protein: 6.9 g/dL (ref 6.5–8.1)

## 2020-06-02 MED ORDER — SODIUM CHLORIDE 0.9 % IV SOLN
Freq: Once | INTRAVENOUS | Status: AC
Start: 2020-06-02 — End: 2020-06-02
  Filled 2020-06-02: qty 250

## 2020-06-02 MED ORDER — HEPARIN SOD (PORK) LOCK FLUSH 100 UNIT/ML IV SOLN
500.0000 [IU] | Freq: Once | INTRAVENOUS | Status: AC | PRN
  Administered 2020-06-02: 500 [IU]
  Filled 2020-06-02: qty 5

## 2020-06-02 MED ORDER — SODIUM CHLORIDE 0.9 % IV SOLN
200.0000 mg | Freq: Once | INTRAVENOUS | Status: AC
  Administered 2020-06-02: 200 mg via INTRAVENOUS
  Filled 2020-06-02: qty 8

## 2020-06-02 MED ORDER — HEPARIN SOD (PORK) LOCK FLUSH 100 UNIT/ML IV SOLN
INTRAVENOUS | Status: AC
  Filled 2020-06-02: qty 5

## 2020-06-02 NOTE — Progress Notes (Signed)
HR and Cr out of parameters.  MD ok to proceed with tx.

## 2020-06-02 NOTE — Progress Notes (Signed)
Hematology/Oncology Consult note [facility]  Telephone:([phone] [phone]  Patient Care Team: [place] Pink, MD as PCP - General (Family Medicine)   Name of the patient: Omar Schroeder  [id number]  Feb 14, 1924   Date of visit: 06/02/20  Diagnosis- stage IV metastatic malignant melanoma with lung and liver metastases   Chief complaint/ Reason for visit-on treatment assessment prior to cycle 57 of palliative Keytruda  Heme/Onc history: Patient is an [age] year old male who lives alone and is independent of his ADLs. He has no significant comorbidities other than hyperlipidemia and GERD. He has a history of superficial skin cancer. He presented to the ER with symptoms of right-sided flank pain and had a CT renal stone study which showed a 2.1 x 1.1 cm right lower lobe pulmonary nodule. Also noted to have multiple new ill-defined lesions throughout the right lobe of the liver largest measuring 3.4 x 2.8 cm. Spleen and pancreas was unremarkable. Low-attenuation lesion in the right kidney 2.6 x 1.2 cm likely representing a cyst. No intra-abdominal adenopathy. 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  Single agent Beryle Flock started in June 2021  Interval history-patient is doing well on Keytruda and reports no significant side effects at this time  ECOG PS- 1 Pain scale- 0   Review of systems- Review of Systems  Constitutional: Negative for chills, fever, malaise/fatigue and weight loss.  HENT:  Negative for congestion, ear discharge and nosebleeds.   Eyes: Negative for blurred vision.  Respiratory: Negative for cough, hemoptysis, sputum production, shortness of breath and wheezing.   Cardiovascular: Negative for chest pain, palpitations, orthopnea and claudication.  Gastrointestinal: Negative for abdominal pain, blood in stool, constipation, diarrhea, heartburn, melena, nausea and vomiting.  Genitourinary: Negative for dysuria, flank pain, frequency, hematuria and urgency.  Musculoskeletal: Negative for back pain, joint pain and myalgias.  Skin: Negative for rash.  Neurological: Negative for dizziness, tingling, focal weakness, seizures, weakness and headaches.  Endo/Heme/Allergies: Does not bruise/bleed easily.  Psychiatric/Behavioral: Negative for depression and suicidal ideas. The patient does not have insomnia.       No Known Allergies   Past Medical History:  Diagnosis Date  . Melanoma [facility])      Past Surgical History:  Procedure Laterality Date  . CATARACT EXTRACTION Bilateral 2000  . HERNIA REPAIR    . MELANOMA EXCISION    . PORTA CATH INSERTION N/A 06/28/2019   Procedure: PORTA CATH INSERTION;  Surgeon: Algernon Huxley, MD;  Location: [place] [facility];  Service: Cardiovascular;  Laterality: N/A;    Social History   Socioeconomic History  . Marital status: Widowed    Spouse name: Not on file  . Number of children: Not on file  . Years of education: Not on file  . Highest education level: Not on file  Occupational History  . Not on file  Tobacco Use  . Smoking status: Never Smoker  . Smokeless tobacco: Never Used  Vaping Use  . Vaping Use: Never used  Substance and Sexual Activity  . Alcohol use: Never  . Drug use: Not Currently  .  Sexual activity: Not Currently  Other Topics Concern  . Not on file  Social History Narrative   Lives at home by himself    Social Determinants of Health   Financial Resource Strain: Not on file  Food Insecurity:  Not on file  Transportation Needs: Not on file  Physical Activity: Not on file  Stress: Not on file  Social Connections: Not on file  Intimate Partner Violence: Not on file    History reviewed. No pertinent family history.   Current Outpatient Medications:  .  acetaminophen (TYLENOL) 325 MG tablet, Take 650 mg by mouth every 6 (six) hours as needed. , Disp: , Rfl:  .  Loratadine 10 MG CAPS, Take 1 capsule by mouth daily as needed. , Disp: , Rfl:  .  lidocaine-prilocaine (EMLA) cream, Apply to affected area once (Patient not taking: No sig reported), Disp: 30 g, Rfl: 3 .  ondansetron (ZOFRAN) 8 MG tablet, Take 1 tablet (8 mg total) by mouth 2 (two) times daily as needed (Nausea or vomiting). (Patient not taking: No sig reported), Disp: 30 tablet, Rfl: 1 .  prochlorperazine (COMPAZINE) 10 MG tablet, Take 1 tablet (10 mg total) by mouth every 6 (six) hours as needed (Nausea or vomiting). (Patient not taking: No sig reported), Disp: 30 tablet, Rfl: 1 .  traMADol (ULTRAM) 50 MG tablet, TAKE 1 TABLET (50 MG TOTAL) BY MOUTH EVERY 6 (SIX) HOURS AS NEEDED. (Patient not taking: No sig reported), Disp: 60 tablet, Rfl: 0  Physical exam:  Vitals:   06/02/20 0907  BP: 139/66  Pulse: (!) 103  Resp: 16  Temp: 98 F (36.7 C)  TempSrc: Oral  Weight: 174 lb 12.8 oz (79.3 kg)  Height: _0  (1.753 m)   Physical Exam Constitutional:      General: He is not in acute distress. Cardiovascular:     Rate and Rhythm: Normal rate and regular rhythm.     Heart sounds: Normal heart sounds.  Pulmonary:     Effort: Pulmonary effort is normal.     Breath sounds: Normal breath sounds.  Abdominal:     General: Bowel sounds are normal.     Palpations: Abdomen is soft.  Skin:    General: Skin is warm and dry.  Neurological:     Mental Status: He is alert and oriented to person, place, and time.      CMP Latest Ref Rng & Units 06/02/2020  Glucose 70 - 99 mg/dL 108(H)  BUN 8 - 23 mg/dL 21  Creatinine  0.61 - 1.24 mg/dL 1.60(H)  Sodium 135 - 145 mmol/L 138  Potassium 3.5 - 5.1 mmol/L 4.1  Chloride 98 - 111 mmol/L 105  CO2 22 - 32 mmol/L 23  Calcium 8.9 - 10.3 mg/dL 8.7(L)  Total Protein 6.5 - 8.1 g/dL 6.9  Total Bilirubin 0.3 - 1.2 mg/dL 0.8  Alkaline Phos 38 - 126 U/L 105  AST 15 - 41 U/L 23  ALT 0 - 44 U/L 12   CBC Latest Ref Rng & Units 06/02/2020  WBC 4.0 - 10.5 K/uL 7.8  Hemoglobin 13.0 - 17.0 g/dL 13.8  Hematocrit 39.0 - 52.0 % 42.1  Platelets 150 - 400 K/uL 183    No images are attached to the encounter.  CT CHEST ABDOMEN PELVIS W CONTRAST  Result Date: 05/09/2020 CLINICAL DATA:  Stage IV metastatic melanoma with lung and liver metastasis. On immunotherapy. Asymptomatic. EXAM: CT CHEST, ABDOMEN, AND PELVIS WITH CONTRAST TECHNIQUE: Multidetector CT imaging of the chest,  abdomen and pelvis was performed following the standard protocol during bolus administration of intravenous contrast. CONTRAST:  46m OMNIPAQUE IOHEXOL 300 MG/ML  SOLN COMPARISON:  12/31/2019 FINDINGS: CT CHEST FINDINGS Cardiovascular: Right Port-A-Cath tip at low SVC. Normal heart size, without pericardial effusion. Aortic atherosclerosis. No central pulmonary embolism, on this non-dedicated study. Mediastinum/Nodes: No supraclavicular adenopathy. No mediastinal or hilar adenopathy. Lungs/Pleura: No pleural fluid. Similar marked right hemidiaphragm elevation. Volume loss at the right lung base. 2 mm medial right apical pleural-based nodular density is similar on 43/4. Posterior right upper lobe pulmonary nodule is less well-defined today. On the order of 5 mm maximally on 71/4 versus 8 x 4 mm on the prior exam. Medial right lower lobe pulmonary nodule measures 1.3 x 1.1 cm on 108/4 versus 1.6 x 1.1 cm on the prior exam. No new or enlarging pulmonary nodules identified. Musculoskeletal: Thoracic compression deformities, most significant at T9 and T12, similar. CT ABDOMEN PELVIS FINDINGS Hepatobiliary: Index subcapsular  right hepatic lobe hypoattenuating lesion is slightly less well-defined today. On the order of 1.5 x 0.9 cm on 62/2 versus 1.8 x 0.9 cm on the prior. Central and caudal to this is a right hepatic lobe lesion which nearly has resolved. Example at 6 mm on 63/2 versus 11 x 10 mm on the prior exam. No new or enlarging liver lesions. A segment 4A cyst is again identified. Normal gallbladder, without biliary ductal dilatation. Pancreas: Normal, without mass or ductal dilatation. Spleen: Normal in size, without focal abnormality. Adrenals/Urinary Tract: Normal adrenal glands. Expected renal cortical thinning for age. An interpolar right renal 2.2 cm low-density lesion is likely a cyst. No hydronephrosis. Bladder saccules in the setting of prostatomegaly. Stomach/Bowel: Normal stomach, without wall thickening. Extensive colonic diverticulosis. Normal terminal ileum and appendix. Normal small bowel. Vascular/Lymphatic: Aortic atherosclerosis. No abdominopelvic adenopathy. Reproductive: Moderate to marked prostatomegaly. Other: No significant free fluid. No evidence of omental or peritoneal disease. Musculoskeletal: Osteopenia. Bilateral L5 pars defects. Grade 1 L5-S1 anterolisthesis. Lumbar compression deformities are similar, without gross ventral canal encroachment. IMPRESSION: 1. Response to therapy of hepatic and liver metastasis. 2. No new or progressive disease. 3. Prostatomegaly with bladder outlet obstruction. 4. Chronic marked right hemidiaphragm elevation. 5.  Aortic Atherosclerosis (ICD10-I70.0). Electronically Signed   By: KAbigail MiyamotoM.D.   On: 05/09/2020 16:03     Assessment and plan- Patient is a 87y.o. male with malignant metastatic melanoma with lung and liver metastases here for on treatment assessment prior to cycle 17 of palliative Keytruda  Counts okay to proceed with cycle 17 of palliative Keytruda today.  I will see him back in 3 weeks for cycle 18.  Plan to repeat scans in July.  Baseline  CKD: Stable continue to monitor   Visit Diagnosis 1. Encounter for antineoplastic immunotherapy   2. [place]astatic [place] ([place]      Dr. ARanda Evens MD, MPH [id number]/23/2022 1:58 PM

## 2020-06-02 NOTE — Progress Notes (Signed)
Ok to tx today per MD : HR/ Creat out of parameters.

## 2020-06-02 NOTE — Progress Notes (Signed)
Pt doing good--no issues

## 2020-06-02 NOTE — Patient Instructions (Signed)
[facility] [place] [facility]  Discharge Instructions: Thank you for choosing [facility] to provide your oncology and hematology care.  If you have a lab appointment with the Cancer Center, please go directly to the Cancer Center and check in at the registration area.  Wear comfortable clothing and clothing appropriate for easy access to any Portacath or PICC line.   We strive to give you quality time with your provider. You may need to reschedule your appointment if you arrive late (15 or more minutes).  Arriving late affects you and other patients whose appointments are after yours.  Also, if you miss three or more appointments without notifying the office, you may be dismissed from the clinic at the provider's discretion.      For prescription refill requests, have your pharmacy contact our office and allow 72 hours for refills to be completed.    Today you received the following chemotherapy and/or immunotherapy agents keytruda   To help prevent nausea and vomiting after your treatment, we encourage you to take your nausea medication as directed.  BELOW ARE SYMPTOMS THAT SHOULD BE REPORTED IMMEDIATELY: *FEVER GREATER THAN 100.4 F (38 C) OR HIGHER *CHILLS OR SWEATING *NAUSEA AND VOMITING THAT IS NOT CONTROLLED WITH YOUR NAUSEA MEDICATION *UNUSUAL SHORTNESS OF BREATH *UNUSUAL BRUISING OR BLEEDING *URINARY PROBLEMS (pain or burning when urinating, or frequent urination) *BOWEL PROBLEMS (unusual diarrhea, constipation, pain near the anus) TENDERNESS IN MOUTH AND THROAT WITH OR WITHOUT PRESENCE OF ULCERS (sore throat, sores in mouth, or a toothache) UNUSUAL RASH, SWELLING OR PAIN  UNUSUAL VAGINAL DISCHARGE OR ITCHING   Items with * indicate a potential emergency and should be followed up as soon as possible or go to the Emergency Department if any problems should occur.  Please show the CHEMOTHERAPY ALERT CARD or IMMUNOTHERAPY ALERT CARD at check-in to  the Emergency Department and triage nurse.  Should you have questions after your visit or need to cancel or reschedule your appointment, please contact [facility] CENTER [place] [facility]  [phone] and follow the prompts.  Office hours are 8:00 a.m. to 4:30 p.m. Monday - Friday. Please note that voicemails left after 4:00 p.m. may not be returned until the following business day.  We are closed weekends and major holidays. You have access to a nurse at all times for urgent questions. Please call the main number to the clinic [phone] and follow the prompts.  For any non-urgent questions, you may also contact your provider using MyChart. We now offer e-Visits for anyone 85 and older to request care online for non-urgent symptoms. For details visit [email or web address].   Also download the MyChart app! Go to the app store, search "MyChart", open the app, select [place], and log in with your MyChart username and password.  Due to Covid, a mask is required upon entering the hospital/clinic. If you do not have a mask, one will be given to you upon arrival. For doctor visits, patients may have 1 support person aged 18 or older with them. For treatment visits, patients cannot have anyone with them due to current Covid guidelines and our immunocompromised population.  

## 2020-06-23 ENCOUNTER — Inpatient Hospital Stay: Payer: Medicare Other | Attending: Oncology

## 2020-06-23 ENCOUNTER — Inpatient Hospital Stay (HOSPITAL_BASED_OUTPATIENT_CLINIC_OR_DEPARTMENT_OTHER): Payer: Medicare Other | Admitting: Oncology

## 2020-06-23 ENCOUNTER — Encounter: Payer: Self-pay | Admitting: Oncology

## 2020-06-23 ENCOUNTER — Inpatient Hospital Stay: Payer: Medicare Other

## 2020-06-23 VITALS — BP 132/61 | HR 51 | Temp 97.9°F | Resp 16 | Ht 69.0 in | Wt 174.1 lb

## 2020-06-23 DIAGNOSIS — C439 Malignant melanoma of skin, unspecified: Secondary | ICD-10-CM

## 2020-06-23 DIAGNOSIS — C799 Secondary malignant neoplasm of unspecified site: Secondary | ICD-10-CM | POA: Diagnosis not present

## 2020-06-23 DIAGNOSIS — C787 Secondary malignant neoplasm of liver and intrahepatic bile duct: Secondary | ICD-10-CM | POA: Diagnosis not present

## 2020-06-23 DIAGNOSIS — Z5112 Encounter for antineoplastic immunotherapy: Secondary | ICD-10-CM

## 2020-06-23 DIAGNOSIS — C7800 Secondary malignant neoplasm of unspecified lung: Secondary | ICD-10-CM | POA: Diagnosis not present

## 2020-06-23 DIAGNOSIS — Z79899 Other long term (current) drug therapy: Secondary | ICD-10-CM | POA: Insufficient documentation

## 2020-06-23 DIAGNOSIS — N183 Chronic kidney disease, stage 3 unspecified: Secondary | ICD-10-CM

## 2020-06-23 LAB — COMPREHENSIVE METABOLIC PANEL
ALT: 12 U/L (ref 0–44)
AST: 22 U/L (ref 15–41)
Albumin: 4 g/dL (ref 3.5–5.0)
Alkaline Phosphatase: 101 U/L (ref 38–126)
Anion gap: 8 (ref 5–15)
BUN: 21 mg/dL (ref 8–23)
CO2: 26 mmol/L (ref 22–32)
Calcium: 8.7 mg/dL — ABNORMAL LOW (ref 8.9–10.3)
Chloride: 103 mmol/L (ref 98–111)
Creatinine, Ser: 1.65 mg/dL — ABNORMAL HIGH (ref 0.61–1.24)
GFR, Estimated: 38 mL/min — ABNORMAL LOW (ref >60)
Glucose, Bld: 93 mg/dL (ref 70–99)
Potassium: 4.2 mmol/L (ref 3.5–5.1)
Sodium: 137 mmol/L (ref 135–145)
Total Bilirubin: 0.6 mg/dL (ref 0.3–1.2)
Total Protein: 6.9 g/dL (ref 6.5–8.1)

## 2020-06-23 LAB — CBC WITH DIFFERENTIAL/PLATELET
Abs Immature Granulocytes: 0.02 10*3/uL (ref 0.00–0.07)
Basophils Absolute: 0.1 10*3/uL (ref 0.0–0.1)
Basophils Relative: 1 %
Eosinophils Absolute: 0.4 10*3/uL (ref 0.0–0.5)
Eosinophils Relative: 5 %
HCT: 41.7 % (ref 39.0–52.0)
Hemoglobin: 13.8 g/dL (ref 13.0–17.0)
Immature Granulocytes: 0 %
Lymphocytes Relative: 30 %
Lymphs Abs: 2.3 10*3/uL (ref 0.7–4.0)
MCH: 30.7 pg (ref 26.0–34.0)
MCHC: 33.1 g/dL (ref 30.0–36.0)
MCV: 92.7 fL (ref 80.0–100.0)
Monocytes Absolute: 0.6 10*3/uL (ref 0.1–1.0)
Monocytes Relative: 8 %
Neutro Abs: 4.3 10*3/uL (ref 1.7–7.7)
Neutrophils Relative %: 56 %
Platelets: 179 10*3/uL (ref 150–400)
RBC: 4.5 MIL/uL (ref 4.22–5.81)
RDW: 12.7 % (ref 11.5–15.5)
WBC: 7.6 10*3/uL (ref 4.0–10.5)
nRBC: 0 % (ref 0.0–0.2)

## 2020-06-23 LAB — TSH: TSH: 4.19 u[IU]/mL (ref 0.350–4.500)

## 2020-06-23 MED ORDER — SODIUM CHLORIDE 0.9 % IV SOLN
200.0000 mg | Freq: Once | INTRAVENOUS | Status: AC
  Administered 2020-06-23: 200 mg via INTRAVENOUS
  Filled 2020-06-23: qty 8

## 2020-06-23 MED ORDER — HEPARIN SOD (PORK) LOCK FLUSH 100 UNIT/ML IV SOLN
INTRAVENOUS | Status: AC
  Filled 2020-06-23: qty 5

## 2020-06-23 MED ORDER — HEPARIN SOD (PORK) LOCK FLUSH 100 UNIT/ML IV SOLN
500.0000 [IU] | Freq: Once | INTRAVENOUS | Status: AC | PRN
  Administered 2020-06-23: 500 [IU]
  Filled 2020-06-23: qty 5

## 2020-06-23 MED ORDER — SODIUM CHLORIDE 0.9 % IV SOLN
Freq: Once | INTRAVENOUS | Status: AC
  Filled 2020-06-23: qty 250

## 2020-06-23 NOTE — Progress Notes (Signed)
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  He has no significant comorbidities other than hyperlipidemia and GERD.  He has a history of superficial skin cancer.  He presented to the ER with symptoms of right-sided flank pain and had a CT renal stone study which showed a 2.1 x 1.1 cm right lower lobe pulmonary nodule.  Also noted to have multiple new ill-defined lesions throughout the right lobe of the liver largest measuring 3.4 x 2.8 cm.  Spleen and pancreas was unremarkable.  Low-attenuation lesion in the right kidney 2.6 x 1.2 cm likely representing a cyst.  No intra-abdominal adenopathy.  Prostate gland is enlarged measuring 6.2 x 6.5 x 5.3 cm.  Patient has some baseline CKD and his creatinine is around 1.3.   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  NGS testing Showed following genomic variants: EPHA3 R136, NF 1R 440, PB RM1 K61, PBR M1 K702 fs, PTPRT R320, RAD54L Y335A.  PD-L1 60%.  Negative for BRAF.  MRI brain was negative for metastases.   Single agent Beryle Flock started in June 2021  Interval history-patient is doing well overall and denies any specific complaints at this time  ECOG PS- 1 Pain scale- 0   Review of systems- Review of Systems  Constitutional:  Negative for chills, fever, malaise/fatigue and weight loss.  HENT:  Negative for  congestion, ear discharge and nosebleeds.   Eyes:  Negative for blurred vision.  Respiratory:  Negative for cough, hemoptysis, sputum production, shortness of breath and wheezing.   Cardiovascular:  Negative for chest pain, palpitations, orthopnea and claudication.  Gastrointestinal:  Negative for abdominal pain, blood in stool, constipation, diarrhea, heartburn, melena, nausea and vomiting.  Genitourinary:  Negative for dysuria, flank pain, frequency, hematuria and urgency.  Musculoskeletal:  Negative for back pain, joint pain and myalgias.  Skin:  Negative for rash.  Neurological:  Negative for dizziness, tingling, focal weakness, seizures, weakness and headaches.  Endo/Heme/Allergies:  Does not bruise/bleed easily.  Psychiatric/Behavioral:  Negative for depression and suicidal ideas. The patient does not have insomnia.      No Known Allergies   Past Medical History:  Diagnosis Date   Melanoma ([place])      Past Surgical History:  Procedure Laterality Date   CATARACT EXTRACTION Bilateral 2000   HERNIA REPAIR     MELANOMA EXCISION     PORTA CATH INSERTION N/A 06/28/2019   Procedure: PORTA CATH INSERTION;  Surgeon: Algernon Huxley, MD;  Location: [place] [facility];  Service: Cardiovascular;  Laterality: N/A;    Social History   Socioeconomic History   Marital status: Widowed    Spouse name: Not on file   Number of children: Not on file   Years of education: Not on file   Highest education level: Not on file  Occupational History   Not on file  Tobacco Use   Smoking status:  Never   Smokeless tobacco: Never  Vaping Use   Vaping Use: Never used  Substance and Sexual Activity   Alcohol use: Never   Drug use: Not Currently   Sexual activity: Not Currently  Other Topics Concern   Not on file  Social History Narrative   Lives at home by himself    Social Determinants of Health   Financial Resource Strain: Not on file  Food Insecurity: Not on file  Transportation Needs:  Not on file  Physical Activity: Not on file  Stress: Not on file  Social Connections: Not on file  Intimate Partner Violence: Not on file    History reviewed. No pertinent family history.   Current Outpatient Medications:    acetaminophen (TYLENOL) 325 MG tablet, Take 650 mg by mouth every 6 (six) hours as needed. , Disp: , Rfl:    Loratadine 10 MG CAPS, Take 1 capsule by mouth daily as needed. , Disp: , Rfl:    lidocaine-prilocaine (EMLA) cream, Apply to affected area once (Patient not taking: No sig reported), Disp: 30 g, Rfl: 3   ondansetron (ZOFRAN) 8 MG tablet, Take 1 tablet (8 mg total) by mouth 2 (two) times daily as needed (Nausea or vomiting). (Patient not taking: No sig reported), Disp: 30 tablet, Rfl: 1   prochlorperazine (COMPAZINE) 10 MG tablet, Take 1 tablet (10 mg total) by mouth every 6 (six) hours as needed (Nausea or vomiting). (Patient not taking: No sig reported), Disp: 30 tablet, Rfl: 1   traMADol (ULTRAM) 50 MG tablet, TAKE 1 TABLET (50 MG TOTAL) BY MOUTH EVERY 6 (SIX) HOURS AS NEEDED. (Patient not taking: No sig reported), Disp: 60 tablet, Rfl: 0  Physical exam:  Vitals:   06/23/20 1110  BP: 132/61  Pulse: (!) 51  Resp: 16  Temp: 97.9 F (36.6 C)  TempSrc: Oral  Weight: 174 lb 1.6 oz (79 kg)  Height: '5\' 9"'  (1.753 m)   Physical Exam Constitutional:      General: He is not in acute distress. Cardiovascular:     Rate and Rhythm: Normal rate and regular rhythm.     Heart sounds: Normal heart sounds.  Pulmonary:     Effort: Pulmonary effort is normal.     Breath sounds: Normal breath sounds.  Skin:    General: Skin is warm and dry.  Neurological:     Mental Status: He is alert and oriented to person, place, and time.     CMP Latest Ref Rng & Units 06/23/2020  Glucose 70 - 99 mg/dL 93  BUN 8 - 23 mg/dL 21  Creatinine 0.61 - 1.24 mg/dL 1.65(H)  Sodium 135 - 145 mmol/L 137  Potassium 3.5 - 5.1 mmol/L 4.2  Chloride 98 - 111 mmol/L 103  CO2 22 - 32  mmol/L 26  Calcium 8.9 - 10.3 mg/dL 8.7(L)  Total Protein 6.5 - 8.1 g/dL 6.9  Total Bilirubin 0.3 - 1.2 mg/dL 0.6  Alkaline Phos 38 - 126 U/L 101  AST 15 - 41 U/L 22  ALT 0 - 44 U/L 12   CBC Latest Ref Rng & Units 06/23/2020  WBC 4.0 - 10.5 K/uL 7.6  Hemoglobin 13.0 - 17.0 g/dL 13.8  Hematocrit 39.0 - 52.0 % 41.7  Platelets 150 - 400 K/uL 179     Assessment and plan- Patient is a 87 y.o. male with metastatic malignant melanoma with liver and lung metastases here for next treatment of Keytruda   Counts okay to proceed with next cycle of  Keytruda today.  I will see him back in 3 weeks for cycle 19.  Scans to be repeated next month.  Overall tolerating Keytruda well without any significant side effects.  TSH is normal. Visit Diagnosis 1. Encounter for antineoplastic immunotherapy   2. Metastatic melanoma ([place])      Dr. Randa Evens, MD, MPH [facility] at [facility] [phone] 06/23/2020 5:10 PM

## 2020-06-23 NOTE — Progress Notes (Signed)
Creatinine: 1.65. MD, Dr. Janese Banks, notified and aware. Per MD order: proceed with scheduled Keytruda treatment today.

## 2020-06-23 NOTE — Patient Instructions (Signed)
CANCER CENTER [place] [facility]  Discharge Instructions: Thank you for choosing [facility] to provide your oncology and hematology care.  If you have a lab appointment with the Cancer Center, please go directly to the Cancer Center and check in at the registration area.  Wear comfortable clothing and clothing appropriate for easy access to any Portacath or PICC line.   We strive to give you quality time with your provider. You may need to reschedule your appointment if you arrive late (15 or more minutes).  Arriving late affects you and other patients whose appointments are after yours.  Also, if you miss three or more appointments without notifying the office, you may be dismissed from the clinic at the provider's discretion.      For prescription refill requests, have your pharmacy contact our office and allow 72 hours for refills to be completed.    Today you received the following chemotherapy and/or immunotherapy agents : Keytruda    To help prevent nausea and vomiting after your treatment, we encourage you to take your nausea medication as directed.  BELOW ARE SYMPTOMS THAT SHOULD BE REPORTED IMMEDIATELY: *FEVER GREATER THAN 100.4 F (38 C) OR HIGHER *CHILLS OR SWEATING *NAUSEA AND VOMITING THAT IS NOT CONTROLLED WITH YOUR NAUSEA MEDICATION *UNUSUAL SHORTNESS OF BREATH *UNUSUAL BRUISING OR BLEEDING *URINARY PROBLEMS (pain or burning when urinating, or frequent urination) *BOWEL PROBLEMS (unusual diarrhea, constipation, pain near the anus) TENDERNESS IN MOUTH AND THROAT WITH OR WITHOUT PRESENCE OF ULCERS (sore throat, sores in mouth, or a toothache) UNUSUAL RASH, SWELLING OR PAIN  UNUSUAL VAGINAL DISCHARGE OR ITCHING   Items with * indicate a potential emergency and should be followed up as soon as possible or go to the Emergency Department if any problems should occur.  Please show the CHEMOTHERAPY ALERT CARD or IMMUNOTHERAPY ALERT CARD at check-in  to the Emergency Department and triage nurse.  Should you have questions after your visit or need to cancel or reschedule your appointment, please contact CANCER CENTER [place] [facility] ONCOLOG[facility]  [phone] and follow the prompts.  Office hours are 8:00 a.m. to 4:30 p.m. Monday - Friday. Please note that voicemails left after 4:00 p.m. may not be returned until the following business day.  We are closed weekends and major holidays. You have access to a nurse at all times for urgent questions. Please call the main number to the clinic [phone] and follow the prompts.  For any non-urgent questions, you may also contact your provider using MyChart. We now offer e-Visits for anyone 18 and older to request care online for non-urgent symptoms. For details visit [email or web address].   Also download the MyChart app! Go to the app store, search "MyChart", open the app, select [place], and log in with your MyChart username and password.  Due to Covid, a mask is required upon entering the hospital/clinic. If you do not have a mask, one will be given to you upon arrival. For doctor visits, patients may have 1 support person aged 18 or older with them. For treatment visits, patients cannot have anyone with them due to current Covid guidelines and our immunocompromised population.  

## 2020-06-23 NOTE — Progress Notes (Signed)
Pt doing good, eating and drinking good, Bowels are good. He has no concerns today

## 2020-07-15 ENCOUNTER — Other Ambulatory Visit: Payer: Self-pay

## 2020-07-15 ENCOUNTER — Inpatient Hospital Stay (HOSPITAL_BASED_OUTPATIENT_CLINIC_OR_DEPARTMENT_OTHER): Payer: Medicare Other | Admitting: Oncology

## 2020-07-15 ENCOUNTER — Inpatient Hospital Stay: Payer: Medicare Other

## 2020-07-15 ENCOUNTER — Inpatient Hospital Stay: Payer: Medicare Other | Attending: Oncology

## 2020-07-15 ENCOUNTER — Encounter: Payer: Self-pay | Admitting: Oncology

## 2020-07-15 VITALS — BP 128/54 | HR 38 | Temp 95.9°F | Resp 18 | Wt 176.2 lb

## 2020-07-15 DIAGNOSIS — C439 Malignant melanoma of skin, unspecified: Secondary | ICD-10-CM

## 2020-07-15 DIAGNOSIS — Z5112 Encounter for antineoplastic immunotherapy: Secondary | ICD-10-CM

## 2020-07-15 DIAGNOSIS — C799 Secondary malignant neoplasm of unspecified site: Secondary | ICD-10-CM | POA: Diagnosis not present

## 2020-07-15 DIAGNOSIS — R001 Bradycardia, unspecified: Secondary | ICD-10-CM | POA: Insufficient documentation

## 2020-07-15 DIAGNOSIS — K219 Gastro-esophageal reflux disease without esophagitis: Secondary | ICD-10-CM | POA: Insufficient documentation

## 2020-07-15 DIAGNOSIS — E785 Hyperlipidemia, unspecified: Secondary | ICD-10-CM | POA: Insufficient documentation

## 2020-07-15 DIAGNOSIS — C787 Secondary malignant neoplasm of liver and intrahepatic bile duct: Secondary | ICD-10-CM | POA: Diagnosis not present

## 2020-07-15 DIAGNOSIS — C7800 Secondary malignant neoplasm of unspecified lung: Secondary | ICD-10-CM | POA: Insufficient documentation

## 2020-07-15 LAB — COMPREHENSIVE METABOLIC PANEL
ALT: 12 U/L (ref 0–44)
AST: 23 U/L (ref 15–41)
Albumin: 3.8 g/dL (ref 3.5–5.0)
Alkaline Phosphatase: 100 U/L (ref 38–126)
Anion gap: 7 (ref 5–15)
BUN: 21 mg/dL (ref 8–23)
CO2: 25 mmol/L (ref 22–32)
Calcium: 8.8 mg/dL — ABNORMAL LOW (ref 8.9–10.3)
Chloride: 107 mmol/L (ref 98–111)
Creatinine, Ser: 1.57 mg/dL — ABNORMAL HIGH (ref 0.61–1.24)
GFR, Estimated: 41 mL/min — ABNORMAL LOW (ref >60)
Glucose, Bld: 118 mg/dL — ABNORMAL HIGH (ref 70–99)
Potassium: 4.3 mmol/L (ref 3.5–5.1)
Sodium: 139 mmol/L (ref 135–145)
Total Bilirubin: 0.6 mg/dL (ref 0.3–1.2)
Total Protein: 6.8 g/dL (ref 6.5–8.1)

## 2020-07-15 LAB — CBC WITH DIFFERENTIAL/PLATELET
Abs Immature Granulocytes: 0.02 10*3/uL (ref 0.00–0.07)
Basophils Absolute: 0 10*3/uL (ref 0.0–0.1)
Basophils Relative: 0 %
Eosinophils Absolute: 0.2 10*3/uL (ref 0.0–0.5)
Eosinophils Relative: 3 %
HCT: 42.6 % (ref 39.0–52.0)
Hemoglobin: 13.6 g/dL (ref 13.0–17.0)
Immature Granulocytes: 0 %
Lymphocytes Relative: 22 %
Lymphs Abs: 1.7 10*3/uL (ref 0.7–4.0)
MCH: 30.1 pg (ref 26.0–34.0)
MCHC: 31.9 g/dL (ref 30.0–36.0)
MCV: 94.2 fL (ref 80.0–100.0)
Monocytes Absolute: 0.5 10*3/uL (ref 0.1–1.0)
Monocytes Relative: 6 %
Neutro Abs: 5.2 10*3/uL (ref 1.7–7.7)
Neutrophils Relative %: 69 %
Platelets: 189 10*3/uL (ref 150–400)
RBC: 4.52 MIL/uL (ref 4.22–5.81)
RDW: 12.6 % (ref 11.5–15.5)
WBC: 7.7 10*3/uL (ref 4.0–10.5)
nRBC: 0 % (ref 0.0–0.2)

## 2020-07-15 MED ORDER — SODIUM CHLORIDE 0.9 % IV SOLN
Freq: Once | INTRAVENOUS | Status: AC
  Filled 2020-07-15: qty 250

## 2020-07-15 MED ORDER — HEPARIN SOD (PORK) LOCK FLUSH 100 UNIT/ML IV SOLN
INTRAVENOUS | Status: AC
  Filled 2020-07-15: qty 5

## 2020-07-15 MED ORDER — SODIUM CHLORIDE 0.9% FLUSH
10.0000 mL | Freq: Once | INTRAVENOUS | Status: AC
  Administered 2020-07-15: 10 mL via INTRAVENOUS
  Filled 2020-07-15: qty 10

## 2020-07-15 MED ORDER — SODIUM CHLORIDE 0.9 % IV SOLN
200.0000 mg | Freq: Once | INTRAVENOUS | Status: AC
  Administered 2020-07-15: 200 mg via INTRAVENOUS
  Filled 2020-07-15: qty 8

## 2020-07-15 MED ORDER — HEPARIN SOD (PORK) LOCK FLUSH 100 UNIT/ML IV SOLN
500.0000 [IU] | Freq: Once | INTRAVENOUS | Status: AC
  Administered 2020-07-15: 500 [IU] via INTRAVENOUS
  Filled 2020-07-15: qty 5

## 2020-07-15 MED ORDER — HEPARIN SOD (PORK) LOCK FLUSH 100 UNIT/ML IV SOLN
500.0000 [IU] | Freq: Once | INTRAVENOUS | Status: DC | PRN
  Filled 2020-07-15: qty 5

## 2020-07-15 NOTE — Progress Notes (Signed)
Hematology/Oncology Consult note [facility]  Telephone:([phone] [phone]  Patient Care Team: [place] Pink, MD as PCP - General (Family Medicine)   Name of the patient: Omar Schroeder  [id number]  1924/11/14   Date of visit: 07/15/20  Diagnosis- stage IV metastatic malignant melanoma with lung and liver metastases  Chief complaint/ Reason for visit-on treatment assessment prior to cycle 64 of palliative Keytruda  Heme/Onc history: Patient is an 87 year-old male who lives alone and is independent of his ADLs.  He has no significant comorbidities other than hyperlipidemia and GERD.  He has a history of superficial skin cancer.  He presented to the ER with symptoms of right-sided flank pain and had a CT renal stone study which showed a 2.1 x 1.1 cm right lower lobe pulmonary nodule.  Also noted to have multiple new ill-defined lesions throughout the right lobe of the liver largest measuring 3.4 x 2.8 cm.  Spleen and pancreas was unremarkable.  Low-attenuation lesion in the right kidney 2.6 x 1.2 cm likely representing a cyst.  No intra-abdominal adenopathy.  Prostate gland is enlarged measuring 6.2 x 6.5 x 5.3 cm.  Patient has some baseline CKD and his creatinine is around 1.3.   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  NGS testing Showed following genomic variants: EPHA3 R136, NF 1R 440, PB RM1 K61, PBR M1 K702 fs, PTPRT C789, RAD54L Y335A.  PD-L1 60%.  Negative for BRAF.  MRI brain was negative for metastases.   Single agent Beryle Flock started in June 2021  Interval history-tolerating chemotherapy well.  Denies any specific complaints at this time  ECOG PS- 1 Pain scale- 0  Review of systems- Review of Systems  Constitutional:  Negative for chills, fever, malaise/fatigue and weight loss.  HENT:  Negative for  congestion, ear discharge and nosebleeds.   Eyes:  Negative for blurred vision.  Respiratory:  Negative for cough, hemoptysis, sputum production, shortness of breath and wheezing.   Cardiovascular:  Negative for chest pain, palpitations, orthopnea and claudication.  Gastrointestinal:  Negative for abdominal pain, blood in stool, constipation, diarrhea, heartburn, melena, nausea and vomiting.  Genitourinary:  Negative for dysuria, flank pain, frequency, hematuria and urgency.  Musculoskeletal:  Negative for back pain, joint pain and myalgias.  Skin:  Negative for rash.  Neurological:  Negative for dizziness, tingling, focal weakness, seizures, weakness and headaches.  Endo/Heme/Allergies:  Does not bruise/bleed easily.  Psychiatric/Behavioral:  Negative for depression and suicidal ideas. The patient does not have insomnia.       No Known Allergies   Past Medical History:  Diagnosis Date   Melanoma ([place])      Past Surgical History:  Procedure Laterality Date   CATARACT EXTRACTION Bilateral 2000   HERNIA REPAIR     MELANOMA EXCISION     PORTA CATH INSERTION N/A 06/28/2019   Procedure: PORTA CATH INSERTION;  Surgeon: Algernon Huxley, MD;  Location: [place] [facility];  Service: Cardiovascular;  Laterality: N/A;    Social History   Socioeconomic History   Marital status: Widowed    Spouse name: Not on file   Number of children: Not on file   Years of education: Not on file   Highest education level: Not on file  Occupational History   Not on file  Tobacco Use   Smoking status: Never  Smokeless tobacco: Never  Vaping Use   Vaping Use: Never used  Substance and Sexual Activity   Alcohol use: Never   Drug use: Not Currently   Sexual activity: Not Currently  Other Topics Concern   Not on file  Social History Narrative   Lives at home by himself    Social Determinants of Health   Financial Resource Strain: Not on file  Food Insecurity: Not on file  Transportation  Needs: Not on file  Physical Activity: Not on file  Stress: Not on file  Social Connections: Not on file  Intimate Partner Violence: Not on file    No family history on file.   Current Outpatient Medications:    acetaminophen (TYLENOL) 325 MG tablet, Take 650 mg by mouth every 6 (six) hours as needed. , Disp: , Rfl:    lidocaine-prilocaine (EMLA) cream, Apply to affected area once (Patient not taking: No sig reported), Disp: 30 g, Rfl: 3   Loratadine 10 MG CAPS, Take 1 capsule by mouth daily as needed. , Disp: , Rfl:    ondansetron (ZOFRAN) 8 MG tablet, Take 1 tablet (8 mg total) by mouth 2 (two) times daily as needed (Nausea or vomiting). (Patient not taking: No sig reported), Disp: 30 tablet, Rfl: 1   prochlorperazine (COMPAZINE) 10 MG tablet, Take 1 tablet (10 mg total) by mouth every 6 (six) hours as needed (Nausea or vomiting). (Patient not taking: No sig reported), Disp: 30 tablet, Rfl: 1   traMADol (ULTRAM) 50 MG tablet, TAKE 1 TABLET (50 MG TOTAL) BY MOUTH EVERY 6 (SIX) HOURS AS NEEDED. (Patient not taking: No sig reported), Disp: 60 tablet, Rfl: 0  Physical exam:  Vitals:   07/15/20 0909  BP: (!) 128/54  Pulse: (!) 38  Resp: 18  Temp: (!) 95.9 F (35.5 C)  TempSrc: Tympanic  SpO2: 98%  Weight: 176 lb 3.2 oz (79.9 kg)   Physical Exam HENT:     Head: Normocephalic and atraumatic.  Eyes:     Pupils: Pupils are equal, round, and reactive to light.  Cardiovascular:     Rate and Rhythm: Normal rate and regular rhythm.     Heart sounds: Normal heart sounds.  Pulmonary:     Effort: Pulmonary effort is normal.     Breath sounds: Normal breath sounds.  Abdominal:     General: Bowel sounds are normal.     Palpations: Abdomen is soft.  Musculoskeletal:     Cervical back: Normal range of motion.  Skin:    General: Skin is warm and dry.  Neurological:     Mental Status: He is alert and oriented to person, place, and time.     CMP Latest Ref Rng & Units 06/23/2020   Glucose 70 - 99 mg/dL 93  BUN 8 - 23 mg/dL 21  Creatinine 0.61 - 1.24 mg/dL 1.65(H)  Sodium 135 - 145 mmol/L 137  Potassium 3.5 - 5.1 mmol/L 4.2  Chloride 98 - 111 mmol/L 103  CO2 22 - 32 mmol/L 26  Calcium 8.9 - 10.3 mg/dL 8.7(L)  Total Protein 6.5 - 8.1 g/dL 6.9  Total Bilirubin 0.3 - 1.2 mg/dL 0.6  Alkaline Phos 38 - 126 U/L 101  AST 15 - 41 U/L 22  ALT 0 - 44 U/L 12   CBC Latest Ref Rng & Units 06/23/2020  WBC 4.0 - 10.5 K/uL 7.6  Hemoglobin 13.0 - 17.0 g/dL 13.8  Hematocrit 39.0 - 52.0 % 41.7  Platelets 150 - 400 K/uL 179  Assessment and plan- Patient is a 87 y.o. male with metastatic malignant melanoma with liver and lung metastases here for on treatment assessment prior to cycle 19 of palliative Keytruda  Counts okay to proceed with cycle 19 of palliative Keytruda today and I will see him back in 3 weeks for cycle 20.  He is tolerating Keytruda well without any significant side effects.  Repeat scans in August 2022  Bradycardia.  I did repeat his heart rate check and it was 52.  This is chronic and he remains asymptomatic.  He will follow-up with Dr. Kary Kos for this   Visit Diagnosis 1. Encounter for antineoplastic immunotherapy   2. Metastatic melanoma ([place])      Dr. Randa Evens, MD, MPH [facility] at [facility] [phone] 07/15/2020 8:36 AM

## 2020-07-15 NOTE — Patient Instructions (Signed)
CANCER CENTER [place] [facility]COLOG[facility]  Discharge Instructions: Thank you for choosing [facility] to provide your oncology and hematology care.  If you have a lab appointment with the Cancer Center, please go directly to the Cancer Center and check in at the registration area.  Wear comfortable clothing and clothing appropriate for easy access to any Portacath or PICC line.   We strive to give you quality time with your provider. You may need to reschedule your appointment if you arrive late (15 or more minutes).  Arriving late affects you and other patients whose appointments are after yours.  Also, if you miss three or more appointments without notifying the office, you may be dismissed from the clinic at the provider's discretion.      For prescription refill requests, have your pharmacy contact our office and allow 72 hours for refills to be completed.    Today you received the following chemotherapy and/or immunotherapy agents : Keytruda    To help prevent nausea and vomiting after your treatment, we encourage you to take your nausea medication as directed.  BELOW ARE SYMPTOMS THAT SHOULD BE REPORTED IMMEDIATELY: *FEVER GREATER THAN 100.4 F (38 C) OR HIGHER *CHILLS OR SWEATING *NAUSEA AND VOMITING THAT IS NOT CONTROLLED WITH YOUR NAUSEA MEDICATION *UNUSUAL SHORTNESS OF BREATH *UNUSUAL BRUISING OR BLEEDING *URINARY PROBLEMS (pain or burning when urinating, or frequent urination) *BOWEL PROBLEMS (unusual diarrhea, constipation, pain near the anus) TENDERNESS IN MOUTH AND THROAT WITH OR WITHOUT PRESENCE OF ULCERS (sore throat, sores in mouth, or a toothache) UNUSUAL RASH, SWELLING OR PAIN  UNUSUAL VAGINAL DISCHARGE OR ITCHING   Items with * indicate a potential emergency and should be followed up as soon as possible or go to the Emergency Department if any problems should occur.  Please show the CHEMOTHERAPY ALERT CARD or IMMUNOTHERAPY ALERT CARD at check-in  to the Emergency Department and triage nurse.  Should you have questions after your visit or need to cancel or reschedule your appointment, please contact CANCER CENTER [place] [facility]  [phone] and follow the prompts.  Office hours are 8:00 a.m. to 4:30 p.m. Monday - Friday. Please note that voicemails left after 4:00 p.m. may not be returned until the following business day.  We are closed weekends and major holidays. You have access to a nurse at all times for urgent questions. Please call the main number to the clinic [phone] and follow the prompts.  For any non-urgent questions, you may also contact your provider using MyChart. We now offer e-Visits for anyone 18 and older to request care online for non-urgent symptoms. For details visit [email or web address].   Also download the MyChart app! Go to the app store, search "MyChart", open the app, select [place], and log in with your MyChart username and password.  Due to Covid, a mask is required upon entering the hospital/clinic. If you do not have a mask, one will be given to you upon arrival. For doctor visits, patients may have 1 support person aged 18 or older with them. For treatment visits, patients cannot have anyone with them due to current Covid guidelines and our immunocompromised population.  

## 2020-08-04 ENCOUNTER — Inpatient Hospital Stay (HOSPITAL_BASED_OUTPATIENT_CLINIC_OR_DEPARTMENT_OTHER): Payer: Medicare Other | Admitting: Oncology

## 2020-08-04 ENCOUNTER — Inpatient Hospital Stay: Payer: Medicare Other

## 2020-08-04 ENCOUNTER — Encounter: Payer: Self-pay | Admitting: Oncology

## 2020-08-04 ENCOUNTER — Other Ambulatory Visit: Payer: Self-pay

## 2020-08-04 VITALS — BP 124/91 | HR 90 | Temp 97.1°F | Resp 16 | Wt 176.0 lb

## 2020-08-04 DIAGNOSIS — C439 Malignant melanoma of skin, unspecified: Secondary | ICD-10-CM

## 2020-08-04 DIAGNOSIS — C799 Secondary malignant neoplasm of unspecified site: Secondary | ICD-10-CM | POA: Diagnosis not present

## 2020-08-04 DIAGNOSIS — Z5112 Encounter for antineoplastic immunotherapy: Secondary | ICD-10-CM | POA: Diagnosis not present

## 2020-08-04 DIAGNOSIS — Z95828 Presence of other vascular implants and grafts: Secondary | ICD-10-CM

## 2020-08-04 DIAGNOSIS — C787 Secondary malignant neoplasm of liver and intrahepatic bile duct: Secondary | ICD-10-CM

## 2020-08-04 LAB — CBC WITH DIFFERENTIAL/PLATELET
Abs Immature Granulocytes: 0.02 10*3/uL (ref 0.00–0.07)
Basophils Absolute: 0 10*3/uL (ref 0.0–0.1)
Basophils Relative: 0 %
Eosinophils Absolute: 0.3 10*3/uL (ref 0.0–0.5)
Eosinophils Relative: 4 %
HCT: 44.1 % (ref 39.0–52.0)
Hemoglobin: 14.3 g/dL (ref 13.0–17.0)
Immature Granulocytes: 0 %
Lymphocytes Relative: 31 %
Lymphs Abs: 2.3 10*3/uL (ref 0.7–4.0)
MCH: 30.9 pg (ref 26.0–34.0)
MCHC: 32.4 g/dL (ref 30.0–36.0)
MCV: 95.2 fL (ref 80.0–100.0)
Monocytes Absolute: 0.5 10*3/uL (ref 0.1–1.0)
Monocytes Relative: 7 %
Neutro Abs: 4.2 10*3/uL (ref 1.7–7.7)
Neutrophils Relative %: 58 %
Platelets: 177 10*3/uL (ref 150–400)
RBC: 4.63 MIL/uL (ref 4.22–5.81)
RDW: 12.4 % (ref 11.5–15.5)
WBC: 7.4 10*3/uL (ref 4.0–10.5)
nRBC: 0 % (ref 0.0–0.2)

## 2020-08-04 LAB — COMPREHENSIVE METABOLIC PANEL
ALT: 14 U/L (ref 0–44)
AST: 25 U/L (ref 15–41)
Albumin: 4.1 g/dL (ref 3.5–5.0)
Alkaline Phosphatase: 98 U/L (ref 38–126)
Anion gap: 7 (ref 5–15)
BUN: 26 mg/dL — ABNORMAL HIGH (ref 8–23)
CO2: 24 mmol/L (ref 22–32)
Calcium: 8.8 mg/dL — ABNORMAL LOW (ref 8.9–10.3)
Chloride: 107 mmol/L (ref 98–111)
Creatinine, Ser: 1.56 mg/dL — ABNORMAL HIGH (ref 0.61–1.24)
GFR, Estimated: 41 mL/min — ABNORMAL LOW (ref >60)
Glucose, Bld: 128 mg/dL — ABNORMAL HIGH (ref 70–99)
Potassium: 4 mmol/L (ref 3.5–5.1)
Sodium: 138 mmol/L (ref 135–145)
Total Bilirubin: 0.7 mg/dL (ref 0.3–1.2)
Total Protein: 7.2 g/dL (ref 6.5–8.1)

## 2020-08-04 MED ORDER — SODIUM CHLORIDE 0.9% FLUSH
10.0000 mL | Freq: Once | INTRAVENOUS | Status: AC
Start: 2020-08-04 — End: 2020-08-04
  Administered 2020-08-04: 10 mL via INTRAVENOUS
  Filled 2020-08-04: qty 10

## 2020-08-04 MED ORDER — SODIUM CHLORIDE 0.9 % IV SOLN
200.0000 mg | Freq: Once | INTRAVENOUS | Status: AC
  Administered 2020-08-04: 200 mg via INTRAVENOUS
  Filled 2020-08-04: qty 8

## 2020-08-04 MED ORDER — HEPARIN SOD (PORK) LOCK FLUSH 100 UNIT/ML IV SOLN
500.0000 [IU] | Freq: Once | INTRAVENOUS | Status: AC | PRN
  Administered 2020-08-04: 500 [IU]
  Filled 2020-08-04: qty 5

## 2020-08-04 MED ORDER — HEPARIN SOD (PORK) LOCK FLUSH 100 UNIT/ML IV SOLN
INTRAVENOUS | Status: AC
  Filled 2020-08-04: qty 5

## 2020-08-04 MED ORDER — SODIUM CHLORIDE 0.9 % IV SOLN
Freq: Once | INTRAVENOUS | Status: AC
  Filled 2020-08-04: qty 250

## 2020-08-04 MED ORDER — HEPARIN SOD (PORK) LOCK FLUSH 100 UNIT/ML IV SOLN
500.0000 [IU] | Freq: Once | INTRAVENOUS | Status: DC
  Filled 2020-08-04: qty 5

## 2020-08-04 NOTE — Progress Notes (Signed)
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  He has no significant comorbidities other than hyperlipidemia and GERD.  He has a history of superficial skin cancer.  He presented to the ER with symptoms of right-sided flank pain and had a CT renal stone study which showed a 2.1 x 1.1 cm right lower lobe pulmonary nodule.  Also noted to have multiple new ill-defined lesions throughout the right lobe of the liver largest measuring 3.4 x 2.8 cm.  Spleen and pancreas was unremarkable.  Low-attenuation lesion in the right kidney 2.6 x 1.2 cm likely representing a cyst.  No intra-abdominal adenopathy.  Prostate gland is enlarged measuring 6.2 x 6.5 x 5.3 cm.  Patient has some baseline CKD and his creatinine is around 1.3.   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  NGS testing Showed following genomic variants: EPHA3 R136, NF 1R 440, PB RM1 K61, PBR M1 K702 fs, PTPRT H675, RAD54L Y335A.  PD-L1 60%.  Negative for BRAF.  MRI brain was negative for metastases.   Single agent Beryle Flock started in June 2021    Interval history-doing well with treatment so far and denies any specific complaints at this time  ECOG PS- 1 Pain scale- 0   Review of systems- Review of Systems  Constitutional:  Negative for chills, fever, malaise/fatigue and weight loss.  HENT:  Negative  for congestion, ear discharge and nosebleeds.   Eyes:  Negative for blurred vision.  Respiratory:  Negative for cough, hemoptysis, sputum production, shortness of breath and wheezing.   Cardiovascular:  Negative for chest pain, palpitations, orthopnea and claudication.  Gastrointestinal:  Negative for abdominal pain, blood in stool, constipation, diarrhea, heartburn, melena, nausea and vomiting.  Genitourinary:  Negative for dysuria, flank pain, frequency, hematuria and urgency.  Musculoskeletal:  Negative for back pain, joint pain and myalgias.  Skin:  Negative for rash.  Neurological:  Negative for dizziness, tingling, focal weakness, seizures, weakness and headaches.  Endo/Heme/Allergies:  Does not bruise/bleed easily.  Psychiatric/Behavioral:  Negative for depression and suicidal ideas. The patient does not have insomnia.     No Known Allergies   Past Medical History:  Diagnosis Date   Melanoma ([place])      Past Surgical History:  Procedure Laterality Date   CATARACT EXTRACTION Bilateral 2000   HERNIA REPAIR     MELANOMA EXCISION     PORTA CATH INSERTION N/A 06/28/2019   Procedure: PORTA CATH INSERTION;  Surgeon: Algernon Huxley, MD;  Location: [facility];  Service: Cardiovascular;  Laterality: N/A;    Social History   Socioeconomic History   Marital status: Widowed    Spouse name: Not on file   Number of children: Not on file   Years of education: Not on file   Highest education level: Not on file  Occupational History   Not on file  Tobacco Use   Smoking  status: Never   Smokeless tobacco: Never  Vaping Use   Vaping Use: Never used  Substance and Sexual Activity   Alcohol use: Never   Drug use: Not Currently   Sexual activity: Not Currently  Other Topics Concern   Not on file  Social History Narrative   Lives at home by himself    Social Determinants of Health   Financial Resource Strain: Not on file  Food Insecurity: Not on file  Transportation  Needs: Not on file  Physical Activity: Not on file  Stress: Not on file  Social Connections: Not on file  Intimate Partner Violence: Not on file    History reviewed. No pertinent family history.   Current Outpatient Medications:    acetaminophen (TYLENOL) 325 MG tablet, Take 650 mg by mouth every 6 (six) hours as needed. , Disp: , Rfl:    Loratadine 10 MG CAPS, Take 1 capsule by mouth daily as needed. , Disp: , Rfl:    lidocaine-prilocaine (EMLA) cream, Apply to affected area once (Patient not taking: No sig reported), Disp: 30 g, Rfl: 3   ondansetron (ZOFRAN) 8 MG tablet, Take 1 tablet (8 mg total) by mouth 2 (two) times daily as needed (Nausea or vomiting). (Patient not taking: No sig reported), Disp: 30 tablet, Rfl: 1   prochlorperazine (COMPAZINE) 10 MG tablet, Take 1 tablet (10 mg total) by mouth every 6 (six) hours as needed (Nausea or vomiting). (Patient not taking: No sig reported), Disp: 30 tablet, Rfl: 1   traMADol (ULTRAM) 50 MG tablet, TAKE 1 TABLET (50 MG TOTAL) BY MOUTH EVERY 6 (SIX) HOURS AS NEEDED. (Patient not taking: No sig reported), Disp: 60 tablet, Rfl: 0  Physical exam:  Vitals:   08/04/20 0847  BP: (!) 124/91  Pulse: 90  Resp: 16  Temp: (!) 97.1 F (36.2 C)  SpO2: 95%  Weight: 176 lb (79.8 kg)   Physical Exam Constitutional:      General: He is not in acute distress. Cardiovascular:     Rate and Rhythm: Normal rate and regular rhythm.     Heart sounds: Normal heart sounds.  Pulmonary:     Effort: Pulmonary effort is normal.     Breath sounds: Normal breath sounds.  Abdominal:     General: Bowel sounds are normal.     Palpations: Abdomen is soft.  Skin:    General: Skin is warm and dry.  Neurological:     Mental Status: He is alert and oriented to person, place, and time.     CMP Latest Ref Rng & Units 08/04/2020  Glucose 70 - 99 mg/dL 128(H)  BUN 8 - 23 mg/dL 26(H)  Creatinine 0.61 - 1.24 mg/dL 1.56(H)  Sodium 135 - 145 mmol/L 138  Potassium  3.5 - 5.1 mmol/L 4.0  Chloride 98 - 111 mmol/L 107  CO2 22 - 32 mmol/L 24  Calcium 8.9 - 10.3 mg/dL 8.8(L)  Total Protein 6.5 - 8.1 g/dL 7.2  Total Bilirubin 0.3 - 1.2 mg/dL 0.7  Alkaline Phos 38 - 126 U/L 98  AST 15 - 41 U/L 25  ALT 0 - 44 U/L 14   CBC Latest Ref Rng & Units 08/04/2020  WBC 4.0 - 10.5 K/uL 7.4  Hemoglobin 13.0 - 17.0 g/dL 14.3  Hematocrit 39.0 - 52.0 % 44.1  Platelets 150 - 400 K/uL 177     Assessment and plan- Patient is a 87 y.o. male with metastatic malignant melanoma with liver and lung metastases.  He is  here for on treatment assessment prior to cycle 20 of palliative Keytruda  Counts okay to proceed with cycle 20 of palliative Keytruda today and I will see him back in 3 weeks for cycle 21.  Repeat CT chest abdomen pelvis without contrast in 2 weeks.  He has been on Keytruda for over a year now and tolerated it very well without any significant side effects and scans continue to show good response.  Recent TSH has also been normal.   Visit Diagnosis 1. Liver metastases ([place])   2. Metastatic melanoma ([place])   3. Encounter for antineoplastic immunotherapy      Dr. Randa Evens, MD, MPH [facility] at [facility] [phone] 08/04/2020 11:03 AM

## 2020-08-19 ENCOUNTER — Ambulatory Visit
Admission: RE | Admit: 2020-08-19 | Discharge: 2020-08-19 | Disposition: A | Discharge status: Home / Self Care | Payer: Medicare Other | Source: Ambulatory Visit | Attending: Oncology | Admitting: Oncology

## 2020-08-19 ENCOUNTER — Other Ambulatory Visit: Payer: Self-pay

## 2020-08-19 DIAGNOSIS — C7800 Secondary malignant neoplasm of unspecified lung: Secondary | ICD-10-CM | POA: Diagnosis not present

## 2020-08-19 DIAGNOSIS — I700 Atherosclerosis of aorta: Secondary | ICD-10-CM | POA: Insufficient documentation

## 2020-08-19 DIAGNOSIS — N400 Enlarged prostate without lower urinary tract symptoms: Secondary | ICD-10-CM | POA: Insufficient documentation

## 2020-08-19 DIAGNOSIS — K5730 Diverticulosis of large intestine without perforation or abscess without bleeding: Secondary | ICD-10-CM | POA: Diagnosis not present

## 2020-08-19 DIAGNOSIS — C787 Secondary malignant neoplasm of liver and intrahepatic bile duct: Secondary | ICD-10-CM | POA: Diagnosis not present

## 2020-08-19 DIAGNOSIS — C799 Secondary malignant neoplasm of unspecified site: Secondary | ICD-10-CM | POA: Insufficient documentation

## 2020-08-19 DIAGNOSIS — C439 Malignant melanoma of skin, unspecified: Secondary | ICD-10-CM

## 2020-08-19 DIAGNOSIS — I2510 Atherosclerotic heart disease of native coronary artery without angina pectoris: Secondary | ICD-10-CM | POA: Diagnosis not present

## 2020-08-25 ENCOUNTER — Encounter: Payer: Self-pay | Admitting: Oncology

## 2020-08-25 ENCOUNTER — Other Ambulatory Visit: Payer: Self-pay

## 2020-08-25 ENCOUNTER — Inpatient Hospital Stay (HOSPITAL_BASED_OUTPATIENT_CLINIC_OR_DEPARTMENT_OTHER): Payer: Medicare Other | Admitting: Oncology

## 2020-08-25 ENCOUNTER — Inpatient Hospital Stay: Payer: Medicare Other

## 2020-08-25 ENCOUNTER — Inpatient Hospital Stay: Payer: Medicare Other | Attending: Oncology

## 2020-08-25 VITALS — BP 109/65 | HR 83 | Temp 96.1°F | Resp 20 | Wt 175.3 lb

## 2020-08-25 DIAGNOSIS — C787 Secondary malignant neoplasm of liver and intrahepatic bile duct: Secondary | ICD-10-CM | POA: Insufficient documentation

## 2020-08-25 DIAGNOSIS — I2510 Atherosclerotic heart disease of native coronary artery without angina pectoris: Secondary | ICD-10-CM | POA: Insufficient documentation

## 2020-08-25 DIAGNOSIS — C439 Malignant melanoma of skin, unspecified: Secondary | ICD-10-CM | POA: Insufficient documentation

## 2020-08-25 DIAGNOSIS — M4317 Spondylolisthesis, lumbosacral region: Secondary | ICD-10-CM | POA: Insufficient documentation

## 2020-08-25 DIAGNOSIS — N400 Enlarged prostate without lower urinary tract symptoms: Secondary | ICD-10-CM | POA: Diagnosis not present

## 2020-08-25 DIAGNOSIS — K5730 Diverticulosis of large intestine without perforation or abscess without bleeding: Secondary | ICD-10-CM | POA: Insufficient documentation

## 2020-08-25 DIAGNOSIS — N189 Chronic kidney disease, unspecified: Secondary | ICD-10-CM | POA: Insufficient documentation

## 2020-08-25 DIAGNOSIS — C799 Secondary malignant neoplasm of unspecified site: Secondary | ICD-10-CM

## 2020-08-25 DIAGNOSIS — K219 Gastro-esophageal reflux disease without esophagitis: Secondary | ICD-10-CM | POA: Diagnosis not present

## 2020-08-25 DIAGNOSIS — C7800 Secondary malignant neoplasm of unspecified lung: Secondary | ICD-10-CM | POA: Diagnosis not present

## 2020-08-25 DIAGNOSIS — Z5112 Encounter for antineoplastic immunotherapy: Secondary | ICD-10-CM | POA: Diagnosis not present

## 2020-08-25 DIAGNOSIS — E785 Hyperlipidemia, unspecified: Secondary | ICD-10-CM | POA: Insufficient documentation

## 2020-08-25 LAB — COMPREHENSIVE METABOLIC PANEL
ALT: 12 U/L (ref 0–44)
AST: 26 U/L (ref 15–41)
Albumin: 4 g/dL (ref 3.5–5.0)
Alkaline Phosphatase: 104 U/L (ref 38–126)
Anion gap: 8 (ref 5–15)
BUN: 22 mg/dL (ref 8–23)
CO2: 24 mmol/L (ref 22–32)
Calcium: 8.6 mg/dL — ABNORMAL LOW (ref 8.9–10.3)
Chloride: 106 mmol/L (ref 98–111)
Creatinine, Ser: 1.56 mg/dL — ABNORMAL HIGH (ref 0.61–1.24)
GFR, Estimated: 41 mL/min — ABNORMAL LOW (ref >60)
Glucose, Bld: 142 mg/dL — ABNORMAL HIGH (ref 70–99)
Potassium: 3.8 mmol/L (ref 3.5–5.1)
Sodium: 138 mmol/L (ref 135–145)
Total Bilirubin: 0.9 mg/dL (ref 0.3–1.2)
Total Protein: 7 g/dL (ref 6.5–8.1)

## 2020-08-25 LAB — CBC WITH DIFFERENTIAL/PLATELET
Abs Immature Granulocytes: 0.01 10*3/uL (ref 0.00–0.07)
Basophils Absolute: 0 10*3/uL (ref 0.0–0.1)
Basophils Relative: 1 %
Eosinophils Absolute: 0.4 10*3/uL (ref 0.0–0.5)
Eosinophils Relative: 6 %
HCT: 43.6 % (ref 39.0–52.0)
Hemoglobin: 14.3 g/dL (ref 13.0–17.0)
Immature Granulocytes: 0 %
Lymphocytes Relative: 33 %
Lymphs Abs: 2.2 10*3/uL (ref 0.7–4.0)
MCH: 31.2 pg (ref 26.0–34.0)
MCHC: 32.8 g/dL (ref 30.0–36.0)
MCV: 95.2 fL (ref 80.0–100.0)
Monocytes Absolute: 0.4 10*3/uL (ref 0.1–1.0)
Monocytes Relative: 5 %
Neutro Abs: 3.6 10*3/uL (ref 1.7–7.7)
Neutrophils Relative %: 55 %
Platelets: 181 10*3/uL (ref 150–400)
RBC: 4.58 MIL/uL (ref 4.22–5.81)
RDW: 12.5 % (ref 11.5–15.5)
WBC: 6.6 10*3/uL (ref 4.0–10.5)
nRBC: 0 % (ref 0.0–0.2)

## 2020-08-25 MED ORDER — SODIUM CHLORIDE 0.9 % IV SOLN
200.0000 mg | Freq: Once | INTRAVENOUS | Status: AC
  Administered 2020-08-25: 200 mg via INTRAVENOUS
  Filled 2020-08-25: qty 8

## 2020-08-25 MED ORDER — SODIUM CHLORIDE 0.9 % IV SOLN
Freq: Once | INTRAVENOUS | Status: AC
  Filled 2020-08-25: qty 250

## 2020-08-25 MED ORDER — HEPARIN SOD (PORK) LOCK FLUSH 100 UNIT/ML IV SOLN
INTRAVENOUS | Status: AC
  Administered 2020-08-25: 500 [IU]
  Filled 2020-08-25: qty 5

## 2020-08-25 NOTE — Patient Instructions (Signed)
[facility] [place] [facility]  Discharge Instructions: Thank you for choosing [facility] to provide your oncology and hematology care.  If you have a lab appointment with the Cancer Center, please go directly to the Cancer Center and check in at the registration area.  Wear comfortable clothing and clothing appropriate for easy access to any Portacath or PICC line.   We strive to give you quality time with your provider. You may need to reschedule your appointment if you arrive late (15 or more minutes).  Arriving late affects you and other patients whose appointments are after yours.  Also, if you miss three or more appointments without notifying the office, you may be dismissed from the clinic at the provider's discretion.      For prescription refill requests, have your pharmacy contact our office and allow 72 hours for refills to be completed.    Today you received the following chemotherapy and/or immunotherapy agents keytruda      To help prevent nausea and vomiting after your treatment, we encourage you to take your nausea medication as directed.  BELOW ARE SYMPTOMS THAT SHOULD BE REPORTED IMMEDIATELY: *FEVER GREATER THAN 100.4 F (38 C) OR HIGHER *CHILLS OR SWEATING *NAUSEA AND VOMITING THAT IS NOT CONTROLLED WITH YOUR NAUSEA MEDICATION *UNUSUAL SHORTNESS OF BREATH *UNUSUAL BRUISING OR BLEEDING *URINARY PROBLEMS (pain or burning when urinating, or frequent urination) *BOWEL PROBLEMS (unusual diarrhea, constipation, pain near the anus) TENDERNESS IN MOUTH AND THROAT WITH OR WITHOUT PRESENCE OF ULCERS (sore throat, sores in mouth, or a toothache) UNUSUAL RASH, SWELLING OR PAIN  UNUSUAL VAGINAL DISCHARGE OR ITCHING   Items with * indicate a potential emergency and should be followed up as soon as possible or go to the Emergency Department if any problems should occur.  Please show the CHEMOTHERAPY ALERT CARD or IMMUNOTHERAPY ALERT CARD at check-in to  the Emergency Department and triage nurse.  Should you have questions after your visit or need to cancel or reschedule your appointment, please contact [facility] [place]  [phone] and follow the prompts.  Office hours are 8:00 a.m. to 4:30 p.m. Monday - Friday. Please note that voicemails left after 4:00 p.m. may not be returned until the following business day.  We are closed weekends and major holidays. You have access to a nurse at all times for urgent questions. Please call the main number to the clinic [phone] and follow the prompts.  For any non-urgent questions, you may also contact your provider using MyChart. We now offer e-Visits for anyone 18 and older to request care online for non-urgent symptoms. For details visit [email or web address].   Also download the MyChart app! Go to the app store, search "MyChart", open the app, select [facility], and log in with your MyChart username and password.  Due to Covid, a mask is required upon entering the hospital/clinic. If you do not have a mask, one will be given to you upon arrival. For doctor visits, patients may have 1 support person aged 18 or older with them. For treatment visits, patients cannot have anyone with them due to current Covid guidelines and our immunocompromised population.   Pembrolizumab injection What is this medication? PEMBROLIZUMAB (pem broe liz ue mab) is a monoclonal antibody. It is used totreat certain types of cancer. This medicine may be used for other purposes; ask your health care provider orpharmacist if you have questions. COMMON BRAND NAME(S): Keytruda What should I tell my care team before I   take this medication? They need to know if you have any of these conditions: autoimmune diseases like Crohn's disease, ulcerative colitis, or lupus have had or planning to have an allogeneic stem cell transplant (uses someone else's stem cells) history of organ transplant history  of chest radiation nervous system problems like myasthenia gravis or Guillain-Barre syndrome an unusual or allergic reaction to pembrolizumab, other medicines, foods, dyes, or preservatives pregnant or trying to get pregnant breast-feeding How should I use this medication? This medicine is for infusion into a vein. It is given by a health careprofessional in a hospital or clinic setting. A special MedGuide will be given to you before each treatment. Be sure to readthis information carefully each time. Talk to your pediatrician regarding the use of this medicine in children. While this drug may be prescribed for children as young as 6 months for selectedconditions, precautions do apply. Overdosage: If you think you have taken too much of this medicine contact apoison control center or emergency room at once. NOTE: This medicine is only for you. Do not share this medicine with others. What if I miss a dose? It is important not to miss your dose. Call your doctor or health careprofessional if you are unable to keep an appointment. What may interact with this medication? Interactions have not been studied. This list may not describe all possible interactions. Give your health care provider a list of all the medicines, herbs, non-prescription drugs, or dietary supplements you use. Also tell them if you smoke, drink alcohol, or use illegaldrugs. Some items may interact with your medicine. What should I watch for while using this medication? Your condition will be monitored carefully while you are receiving thismedicine. You may need blood work done while you are taking this medicine. Do not become pregnant while taking this medicine or for 4 months after stopping it. Women should inform their doctor if they wish to become pregnant or think they might be pregnant. There is a potential for serious side effects to an unborn child. Talk to your health care professional or pharmacist for more information. Do  not breast-feed an infant while taking this medicine orfor 4 months after the last dose. What side effects may I notice from receiving this medication? Side effects that you should report to your doctor or health care professionalas soon as possible: allergic reactions like skin rash, itching or hives, swelling of the face, lips, or tongue bloody or black, tarry breathing problems changes in vision chest pain chills confusion constipation cough diarrhea dizziness or feeling faint or lightheaded fast or irregular heartbeat fever flushing joint pain low blood counts - this medicine may decrease the number of white blood cells, red blood cells and platelets. You may be at increased risk for infections and bleeding. muscle pain muscle weakness pain, tingling, numbness in the hands or feet persistent headache redness, blistering, peeling or loosening of the skin, including inside the mouth signs and symptoms of high blood sugar such as dizziness; dry mouth; dry skin; fruity breath; nausea; stomach pain; increased hunger or thirst; increased urination signs and symptoms of kidney injury like trouble passing urine or change in the amount of urine signs and symptoms of liver injury like dark urine, light-colored stools, loss of appetite, nausea, right upper belly pain, yellowing of the eyes or skin sweating swollen lymph nodes weight loss Side effects that usually do not require medical attention (report to yourdoctor or health care professional if they continue or are bothersome): decreased appetite hair   loss tiredness This list may not describe all possible side effects. Call your doctor for medical advice about side effects. You may report side effects to FDA at1-800-FDA-1088. Where should I keep my medication? This drug is given in a hospital or clinic and will not be stored at home. NOTE: This sheet is a summary. It may not cover all possible information. If you have questions about  this medicine, talk to your doctor, pharmacist, orhealth care provider.  2022 Elsevier/Gold Standard (2018-11-29 21:44:53)  

## 2020-08-25 NOTE — Progress Notes (Signed)
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  He has no significant comorbidities other than hyperlipidemia and GERD.  He has a history of superficial skin cancer.  He presented to the ER with symptoms of right-sided flank pain and had a CT renal stone study which showed a 2.1 x 1.1 cm right lower lobe pulmonary nodule.  Also noted to have multiple new ill-defined lesions throughout the right lobe of the liver largest measuring 3.4 x 2.8 cm.  Spleen and pancreas was unremarkable.  Low-attenuation lesion in the right kidney 2.6 x 1.2 cm likely representing a cyst.  No intra-abdominal adenopathy.  Prostate gland is enlarged measuring 6.2 x 6.5 x 5.3 cm.  Patient has some baseline CKD and his creatinine is around 1.3.   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  NGS testing Showed following genomic variants: EPHA3 R136, NF 1R 440, PB RM1 K61, PBR M1 K702 fs, PTPRT H219, RAD54L Y335A.  PD-L1 60%.  Negative for BRAF.  MRI brain was negative for metastases.   Single agent Beryle Flock started in June 2021    Interval history-patient reports doing well overall and denies any specific complaints at this time.  Tolerating treatment well so far  ECOG PS- 1 Pain scale- 0   Review of systems- Review of Systems  Constitutional:  Negative for chills, fever,  malaise/fatigue and weight loss.  HENT:  Negative for congestion, ear discharge and nosebleeds.   Eyes:  Negative for blurred vision.  Respiratory:  Negative for cough, hemoptysis, sputum production, shortness of breath and wheezing.   Cardiovascular:  Negative for chest pain, palpitations, orthopnea and claudication.  Gastrointestinal:  Negative for abdominal pain, blood in stool, constipation, diarrhea, heartburn, melena, nausea and vomiting.  Genitourinary:  Negative for dysuria, flank pain, frequency, hematuria and urgency.  Musculoskeletal:  Negative for back pain, joint pain and myalgias.  Skin:  Negative for rash.  Neurological:  Negative for dizziness, tingling, focal weakness, seizures, weakness and headaches.  Endo/Heme/Allergies:  Does not bruise/bleed easily.  Psychiatric/Behavioral:  Negative for depression and suicidal ideas. The patient does not have insomnia.       No Known Allergies   Past Medical History:  Diagnosis Date   Melanoma ([place])      Past Surgical History:  Procedure Laterality Date   CATARACT EXTRACTION Bilateral 2000   HERNIA REPAIR     MELANOMA EXCISION     PORTA CATH INSERTION N/A 06/28/2019   Procedure: PORTA CATH INSERTION;  Surgeon: Algernon Huxley, MD;  Location: [place] [facility];  Service: Cardiovascular;  Laterality: N/A;    Social History   Socioeconomic History   Marital status: Widowed    Spouse name: Not on file   Number of children: Not on file   Years of education: Not on file   Highest education level: Not on file  Occupational History  Not on file  Tobacco Use   Smoking status: Never   Smokeless tobacco: Never  Vaping Use   Vaping Use: Never used  Substance and Sexual Activity   Alcohol use: Never   Drug use: Not Currently   Sexual activity: Not Currently  Other Topics Concern   Not on file  Social History Narrative   Lives at home by himself    Social Determinants of Health   Financial Resource Strain: Not on  file  Food Insecurity: Not on file  Transportation Needs: Not on file  Physical Activity: Not on file  Stress: Not on file  Social Connections: Not on file  Intimate Partner Violence: Not on file    History reviewed. No pertinent family history.   Current Outpatient Medications:    acetaminophen (TYLENOL) 325 MG tablet, Take 650 mg by mouth every 6 (six) hours as needed. , Disp: , Rfl:    Loratadine 10 MG CAPS, Take 1 capsule by mouth daily as needed. , Disp: , Rfl:    lidocaine-prilocaine (EMLA) cream, Apply to affected area once (Patient not taking: No sig reported), Disp: 30 g, Rfl: 3   ondansetron (ZOFRAN) 8 MG tablet, Take 1 tablet (8 mg total) by mouth 2 (two) times daily as needed (Nausea or vomiting). (Patient not taking: No sig reported), Disp: 30 tablet, Rfl: 1   prochlorperazine (COMPAZINE) 10 MG tablet, Take 1 tablet (10 mg total) by mouth every 6 (six) hours as needed (Nausea or vomiting). (Patient not taking: No sig reported), Disp: 30 tablet, Rfl: 1   traMADol (ULTRAM) 50 MG tablet, TAKE 1 TABLET (50 MG TOTAL) BY MOUTH EVERY 6 (SIX) HOURS AS NEEDED. (Patient not taking: No sig reported), Disp: 60 tablet, Rfl: 0  Physical exam:  Vitals:   08/25/20 0918  BP: 109/65  Pulse: 83  Resp: 20  Temp: (!) 96.1 F (35.6 C)  TempSrc: Tympanic  SpO2: 97%  Weight: 175 lb 4.8 oz (79.5 kg)   Physical Exam Constitutional:      General: He is not in acute distress. Cardiovascular:     Rate and Rhythm: Normal rate and regular rhythm.     Heart sounds: Normal heart sounds.  Pulmonary:     Effort: Pulmonary effort is normal.     Breath sounds: Normal breath sounds.  Abdominal:     General: Bowel sounds are normal.     Palpations: Abdomen is soft.  Skin:    General: Skin is warm and dry.  Neurological:     Mental Status: He is alert and oriented to person, place, and time.     CMP Latest Ref Rng & Units 08/25/2020  Glucose 70 - 99 mg/dL 142(H)  BUN 8 - 23 mg/dL 22   Creatinine 0.61 - 1.24 mg/dL 1.56(H)  Sodium 135 - 145 mmol/L 138  Potassium 3.5 - 5.1 mmol/L 3.8  Chloride 98 - 111 mmol/L 106  CO2 22 - 32 mmol/L 24  Calcium 8.9 - 10.3 mg/dL 8.6(L)  Total Protein 6.5 - 8.1 g/dL 7.0  Total Bilirubin 0.3 - 1.2 mg/dL 0.9  Alkaline Phos 38 - 126 U/L 104  AST 15 - 41 U/L 26  ALT 0 - 44 U/L 12   CBC Latest Ref Rng & Units 08/25/2020  WBC 4.0 - 10.5 K/uL 6.6  Hemoglobin 13.0 - 17.0 g/dL 14.3  Hematocrit 39.0 - 52.0 % 43.6  Platelets 150 - 400 K/uL 181    No images are attached to the encounter.  CT  CHEST ABDOMEN PELVIS WO CONTRAST  Result Date: 08/20/2020 CLINICAL DATA:  Metastatic melanoma restaging EXAM: CT CHEST, ABDOMEN AND PELVIS WITHOUT CONTRAST TECHNIQUE: Multidetector CT imaging of the chest, abdomen and pelvis was performed following the standard protocol without IV contrast. COMPARISON:  05/09/2020 FINDINGS: CT CHEST FINDINGS Cardiovascular: Right Port-A-Cath tip: SVC. Coronary, aortic arch, and branch vessel atherosclerotic vascular disease. Mediastinum/Nodes: Unremarkable Lungs/Pleura: Stable striking elevation of the right hemidiaphragm. Mild biapical pleuroparenchymal scarring. Chronic volume loss along the elevated right hemidiaphragm. Mild scarring or atelectasis posteriorly in the right upper lobe along the major fissure. This partially extends along a thin region of nodularity corresponding to one of the prior metastatic nodules in the right upper lobe which is about 2 mm in thickness on image 63 series 3, stable. The right apical nodule anteromedially measures 0.2 by 0.3 cm on image 29 of series 3, stable from 05/09/2020 and markedly improved from 05/29/2019. On the exam from 05/29/2019 there were 2 substantial right lower lobe pulmonary nodules. The lesion along the azygoesophageal recess is markedly reduced in size compared to 05/29/2019, with some regional atelectasis obscuring any residuum of that nodule and measuring about 0.6 cm in  thickness on image 99 series 3 (formerly 0.9 cm in thickness on 05/09/2020). The more posterior nodule is not visible today and was not visible on 05/09/2020. Musculoskeletal: No findings of metastatic disease. Deformity from old manubrial fracture. Thoracic compression fractures most striking at T9 and T12, unchanged from prior. No compelling findings of osseous malignancy. CT ABDOMEN PELVIS FINDINGS Hepatobiliary: Left hepatic lobe cyst. Septic several of the hypodense lesions previously shown on the 05/29/2019 exam have resolved on interval exams and remain occult compatible with affectively treated malignancy. Small peripheral hypodensities in the right hepatic lobe correspond to prior lesions, one of these measures 0.9 cm in short active cysts on image 58 series 2, previously the same on 05/09/2020. No new or enlarging lesions identified. The gallbladder appears unremarkable. Pancreas: Unremarkable Spleen: Unremarkable Adrenals/Urinary Tract: Stable right mid kidney cyst. Prostate gland indents the bladder base. Stomach/Bowel: Mobile cecum with the cecum in the right upper quadrant extending along the elevated right hemidiaphragm. Sigmoid colon diverticulosis Vascular/Lymphatic: Aortoiliac atherosclerotic vascular disease. Reproductive: Stable prostatomegaly. Other: No supplemental non-categorized findings. Musculoskeletal: Similar appearance of remote compression fractures at L3 and L4. Chronic bilateral pars defects at L5 with 8 mm anterolisthesis of L5 on S1. Multilevel lumbar impingement. IMPRESSION: 1. Mostly stable appearance at the sites of prior hepatic and pulmonary metastatic lesions, with minimal residual nodularity at sites of prior pulmonary metastatic lesions, and with some faint linear hypodensity at the site of prior hepatic metastatic lesions. The only change from the most recent prior is that the thickening along the right lower lobe in the azygoesophageal recess region formerly corresponding  to a lung metastasis appears further improved. No new or progressive sites of malignancy identified. 2. Other imaging findings of potential clinical significance: Aortic Atherosclerosis (ICD10-I70.0). Coronary atherosclerosis. Elevated right hemidiaphragm. Mobile cecum. Stable thoracolumbar compression fractures. Old healed manubrial fracture. Prostatomegaly. Sigmoid colon diverticulosis. Multilevel lumbar impingement. Chronic pars defects at L5. Electronically Signed   By: Van Clines M.D.   On: 08/20/2020 09:35     Assessment and plan- Patient is a 87 y.o. male with metastatic melanoma with lung and liver metastases on palliative Keytruda.  He is here for on treatment assessment prior to cycle 21 of palliative Keytruda.  Counts okay to proceed with cycle 21 of palliative Keytruda today.  I did review  CT chest abdomen pelvis images independently and discussed findings with the patient.  Overall patient continues to have good response to treatment as evidenced by decreasing size of his liver and lung metastases.  Plan is to continue Keytruda until progression or toxicity.  I will see him back in 3 weeks with CBC with differential CMP and TSH    Visit Diagnosis 1. Encounter for antineoplastic immunotherapy   2. Metastatic melanoma ([place])      Dr. Randa Evens, MD, MPH [facility] at [facility] [phone] 08/25/2020 4:01 PM

## 2020-09-16 ENCOUNTER — Encounter: Payer: Self-pay | Admitting: Oncology

## 2020-09-16 ENCOUNTER — Inpatient Hospital Stay (HOSPITAL_BASED_OUTPATIENT_CLINIC_OR_DEPARTMENT_OTHER): Payer: Medicare Other | Admitting: Oncology

## 2020-09-16 ENCOUNTER — Inpatient Hospital Stay: Payer: Medicare Other

## 2020-09-16 ENCOUNTER — Inpatient Hospital Stay: Payer: Medicare Other | Attending: Oncology

## 2020-09-16 ENCOUNTER — Other Ambulatory Visit: Payer: Self-pay | Admitting: *Deleted

## 2020-09-16 VITALS — BP 112/56 | HR 64 | Temp 96.2°F | Resp 18 | Wt 174.0 lb

## 2020-09-16 DIAGNOSIS — C799 Secondary malignant neoplasm of unspecified site: Secondary | ICD-10-CM

## 2020-09-16 DIAGNOSIS — C439 Malignant melanoma of skin, unspecified: Secondary | ICD-10-CM | POA: Insufficient documentation

## 2020-09-16 DIAGNOSIS — C787 Secondary malignant neoplasm of liver and intrahepatic bile duct: Secondary | ICD-10-CM | POA: Insufficient documentation

## 2020-09-16 DIAGNOSIS — E785 Hyperlipidemia, unspecified: Secondary | ICD-10-CM | POA: Diagnosis not present

## 2020-09-16 DIAGNOSIS — C7800 Secondary malignant neoplasm of unspecified lung: Secondary | ICD-10-CM | POA: Insufficient documentation

## 2020-09-16 DIAGNOSIS — N400 Enlarged prostate without lower urinary tract symptoms: Secondary | ICD-10-CM | POA: Diagnosis not present

## 2020-09-16 DIAGNOSIS — K219 Gastro-esophageal reflux disease without esophagitis: Secondary | ICD-10-CM | POA: Diagnosis not present

## 2020-09-16 DIAGNOSIS — Z5112 Encounter for antineoplastic immunotherapy: Secondary | ICD-10-CM | POA: Insufficient documentation

## 2020-09-16 DIAGNOSIS — R7989 Other specified abnormal findings of blood chemistry: Secondary | ICD-10-CM

## 2020-09-16 DIAGNOSIS — K5730 Diverticulosis of large intestine without perforation or abscess without bleeding: Secondary | ICD-10-CM | POA: Diagnosis not present

## 2020-09-16 LAB — CBC WITH DIFFERENTIAL/PLATELET
Abs Immature Granulocytes: 0.01 10*3/uL (ref 0.00–0.07)
Basophils Absolute: 0 10*3/uL (ref 0.0–0.1)
Basophils Relative: 0 %
Eosinophils Absolute: 0.3 10*3/uL (ref 0.0–0.5)
Eosinophils Relative: 4 %
HCT: 43.5 % (ref 39.0–52.0)
Hemoglobin: 14.1 g/dL (ref 13.0–17.0)
Immature Granulocytes: 0 %
Lymphocytes Relative: 26 %
Lymphs Abs: 1.9 10*3/uL (ref 0.7–4.0)
MCH: 30.8 pg (ref 26.0–34.0)
MCHC: 32.4 g/dL (ref 30.0–36.0)
MCV: 95 fL (ref 80.0–100.0)
Monocytes Absolute: 0.7 10*3/uL (ref 0.1–1.0)
Monocytes Relative: 9 %
Neutro Abs: 4.6 10*3/uL (ref 1.7–7.7)
Neutrophils Relative %: 61 %
Platelets: 186 10*3/uL (ref 150–400)
RBC: 4.58 MIL/uL (ref 4.22–5.81)
RDW: 12.3 % (ref 11.5–15.5)
WBC: 7.5 10*3/uL (ref 4.0–10.5)
nRBC: 0 % (ref 0.0–0.2)

## 2020-09-16 LAB — COMPREHENSIVE METABOLIC PANEL
ALT: 13 U/L (ref 0–44)
AST: 22 U/L (ref 15–41)
Albumin: 3.9 g/dL (ref 3.5–5.0)
Alkaline Phosphatase: 98 U/L (ref 38–126)
Anion gap: 7 (ref 5–15)
BUN: 17 mg/dL (ref 8–23)
CO2: 24 mmol/L (ref 22–32)
Calcium: 8.7 mg/dL — ABNORMAL LOW (ref 8.9–10.3)
Chloride: 107 mmol/L (ref 98–111)
Creatinine, Ser: 1.36 mg/dL — ABNORMAL HIGH (ref 0.61–1.24)
GFR, Estimated: 48 mL/min — ABNORMAL LOW (ref >60)
Glucose, Bld: 107 mg/dL — ABNORMAL HIGH (ref 70–99)
Potassium: 4.2 mmol/L (ref 3.5–5.1)
Sodium: 138 mmol/L (ref 135–145)
Total Bilirubin: 0.8 mg/dL (ref 0.3–1.2)
Total Protein: 7.1 g/dL (ref 6.5–8.1)

## 2020-09-16 LAB — TSH: TSH: 4.694 u[IU]/mL — ABNORMAL HIGH (ref 0.350–4.500)

## 2020-09-16 MED ORDER — SODIUM CHLORIDE 0.9 % IV SOLN
Freq: Once | INTRAVENOUS | Status: AC
Start: 2020-09-16 — End: 2020-09-16
  Filled 2020-09-16: qty 250

## 2020-09-16 MED ORDER — HEPARIN SOD (PORK) LOCK FLUSH 100 UNIT/ML IV SOLN
INTRAVENOUS | Status: AC
  Filled 2020-09-16: qty 5

## 2020-09-16 MED ORDER — SODIUM CHLORIDE 0.9% FLUSH
10.0000 mL | Freq: Once | INTRAVENOUS | Status: AC
  Administered 2020-09-16: 10 mL via INTRAVENOUS
  Filled 2020-09-16: qty 10

## 2020-09-16 MED ORDER — HEPARIN SOD (PORK) LOCK FLUSH 100 UNIT/ML IV SOLN
500.0000 [IU] | Freq: Once | INTRAVENOUS | Status: AC | PRN
  Administered 2020-09-16: 500 [IU]
  Filled 2020-09-16: qty 5

## 2020-09-16 MED ORDER — SODIUM CHLORIDE 0.9 % IV SOLN
200.0000 mg | Freq: Once | INTRAVENOUS | Status: AC
  Administered 2020-09-16: 200 mg via INTRAVENOUS
  Filled 2020-09-16: qty 8

## 2020-09-16 NOTE — Progress Notes (Signed)
Hematology/Oncology Consult note [facility]  [phone] [phone]  Patient Care Team: [place] Pink, MD as PCP - General (Family Medicine)   Name of the patient: Omar Schroeder  [id number]  1924/05/18   Date of visit: 09/16/20  Diagnosis- stage IV metastatic malignant melanoma with lung and liver metastases    Chief complaint/ Reason for visit-on treatment assessment prior to cycle 30 of palliative Keytruda  Heme/Onc history:  Patient is an 87 year-old male who lives alone and is independent of his ADLs.  He has no significant comorbidities other than hyperlipidemia and GERD.  He has a history of superficial skin cancer.  He presented to the ER with symptoms of right-sided flank pain and had a CT renal stone study which showed a 2.1 x 1.1 cm right lower lobe pulmonary nodule.  Also noted to have multiple new ill-defined lesions throughout the right lobe of the liver largest measuring 3.4 x 2.8 cm.  Spleen and pancreas was unremarkable.  Low-attenuation lesion in the right kidney 2.6 x 1.2 cm likely representing a cyst.  No intra-abdominal adenopathy.  Prostate gland is enlarged measuring 6.2 x 6.5 x 5.3 cm.  Patient has some baseline CKD and his creatinine is around 1.3.   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  NGS testing Showed following genomic variants: EPHA3 R136, NF 1R 440, PB RM1 K61, PBR M1 K702 fs, PTPRT P619, RAD54L Y335A.  PD-L1 60%.  Negative for BRAF.  MRI brain was negative for metastases.   Single agent Beryle Flock started in June 2021    Interval history-patient is tolerating Keytruda well without any significant side effects.  Denies any nausea vomiting diarrhea skin rash or difficulty breathing.  ECOG PS- 1 Pain scale- 0  Review of systems- Review of Systems  Constitutional:  Negative for  chills, fever, malaise/fatigue and weight loss.  HENT:  Negative for congestion, ear discharge and nosebleeds.   Eyes:  Negative for blurred vision.  Respiratory:  Negative for cough, hemoptysis, sputum production, shortness of breath and wheezing.   Cardiovascular:  Negative for chest pain, palpitations, orthopnea and claudication.  Gastrointestinal:  Negative for abdominal pain, blood in stool, constipation, diarrhea, heartburn, melena, nausea and vomiting.  Genitourinary:  Negative for dysuria, flank pain, frequency, hematuria and urgency.  Musculoskeletal:  Negative for back pain, joint pain and myalgias.  Skin:  Negative for rash.  Neurological:  Negative for dizziness, tingling, focal weakness, seizures, weakness and headaches.  Endo/Heme/Allergies:  Does not bruise/bleed easily.  Psychiatric/Behavioral:  Negative for depression and suicidal ideas. The patient does not have insomnia.      No Known Allergies   Past Medical History:  Diagnosis Date   Melanoma ([place])      Past Surgical History:  Procedure Laterality Date   CATARACT EXTRACTION Bilateral 2000   HERNIA REPAIR     MELANOMA EXCISION     PORTA CATH INSERTION N/A 06/28/2019   Procedure: PORTA CATH INSERTION;  Surgeon: Algernon Huxley, MD;  Location: [place] [facility];  Service: Cardiovascular;  Laterality: N/A;    Social History   Socioeconomic History   Marital status: Widowed    Spouse name: Not on file   Number of children: Not on file   Years of education: Not on file   Highest education level: Not on file  Occupational History  Not on file  Tobacco Use   Smoking status: Never   Smokeless tobacco: Never  Vaping Use   Vaping Use: Never used  Substance and Sexual Activity   Alcohol use: Never   Drug use: Not Currently   Sexual activity: Not Currently  Other Topics Concern   Not on file  Social History Narrative   Lives at home by himself    Social Determinants of Health   Financial Resource  Strain: Not on file  Food Insecurity: Not on file  Transportation Needs: Not on file  Physical Activity: Not on file  Stress: Not on file  Social Connections: Not on file  Intimate Partner Violence: Not on file    No family history on file.   Current Outpatient Medications:    acetaminophen (TYLENOL) 325 MG tablet, Take 650 mg by mouth every 6 (six) hours as needed. , Disp: , Rfl:    Loratadine 10 MG CAPS, Take 1 capsule by mouth daily as needed. , Disp: , Rfl:    lidocaine-prilocaine (EMLA) cream, Apply to affected area once (Patient not taking: No sig reported), Disp: 30 g, Rfl: 3   ondansetron (ZOFRAN) 8 MG tablet, Take 1 tablet (8 mg total) by mouth 2 (two) times daily as needed (Nausea or vomiting). (Patient not taking: No sig reported), Disp: 30 tablet, Rfl: 1   prochlorperazine (COMPAZINE) 10 MG tablet, Take 1 tablet (10 mg total) by mouth every 6 (six) hours as needed (Nausea or vomiting). (Patient not taking: No sig reported), Disp: 30 tablet, Rfl: 1   traMADol (ULTRAM) 50 MG tablet, TAKE 1 TABLET (50 MG TOTAL) BY MOUTH EVERY 6 (SIX) HOURS AS NEEDED. (Patient not taking: No sig reported), Disp: 60 tablet, Rfl: 0 No current facility-administered medications for this visit.  Facility-Administered Medications Ordered in Other Visits:    heparin lock flush 100 UNIT/ML injection, , , ,   Physical exam:  Vitals:   09/16/20 0938  BP: (!) 112/56  Pulse: 64  Resp: 18  Temp: (!) 96.2 F (35.7 C)  TempSrc: Tympanic  SpO2: 98%  Weight: 174 lb (78.9 kg)   Physical Exam Constitutional:      General: He is not in acute distress. Cardiovascular:     Rate and Rhythm: Normal rate and regular rhythm.     Heart sounds: Normal heart sounds.  Pulmonary:     Effort: Pulmonary effort is normal.     Breath sounds: Normal breath sounds.  Abdominal:     General: Bowel sounds are normal.     Palpations: Abdomen is soft.  Skin:    General: Skin is warm and dry.  Neurological:      Mental Status: He is alert and oriented to person, place, and time.     CMP Latest Ref Rng & Units 09/16/2020  Glucose 70 - 99 mg/dL 107(H)  BUN 8 - 23 mg/dL 17  Creatinine 0.61 - 1.24 mg/dL 1.36(H)  Sodium 135 - 145 mmol/L 138  Potassium 3.5 - 5.1 mmol/L 4.2  Chloride 98 - 111 mmol/L 107  CO2 22 - 32 mmol/L 24  Calcium 8.9 - 10.3 mg/dL 8.7(L)  Total Protein 6.5 - 8.1 g/dL 7.1  Total Bilirubin 0.3 - 1.2 mg/dL 0.8  Alkaline Phos 38 - 126 U/L 98  AST 15 - 41 U/L 22  ALT 0 - 44 U/L 13   CBC Latest Ref Rng & Units 09/16/2020  WBC 4.0 - 10.5 K/uL 7.5  Hemoglobin 13.0 - 17.0 g/dL 14.1  Hematocrit 39.0 - 52.0 % 43.5  Platelets 150 - 400 K/uL 186    No images are attached to the encounter.  CT CHEST ABDOMEN PELVIS WO CONTRAST  Result Date: 08/20/2020 CLINICAL DATA:  Metastatic melanoma restaging EXAM: CT CHEST, ABDOMEN AND PELVIS WITHOUT CONTRAST TECHNIQUE: Multidetector CT imaging of the chest, abdomen and pelvis was performed following the standard protocol without IV contrast. COMPARISON:  05/09/2020 FINDINGS: CT CHEST FINDINGS Cardiovascular: Right Port-A-Cath tip: SVC. Coronary, aortic arch, and branch vessel atherosclerotic vascular disease. Mediastinum/Nodes: Unremarkable Lungs/Pleura: Stable striking elevation of the right hemidiaphragm. Mild biapical pleuroparenchymal scarring. Chronic volume loss along the elevated right hemidiaphragm. Mild scarring or atelectasis posteriorly in the right upper lobe along the major fissure. This partially extends along a thin region of nodularity corresponding to one of the prior metastatic nodules in the right upper lobe which is about 2 mm in thickness on image 63 series 3, stable. The right apical nodule anteromedially measures 0.2 by 0.3 cm on image 29 of series 3, stable from 05/09/2020 and markedly improved from 05/29/2019. On the exam from 05/29/2019 there were 2 substantial right lower lobe pulmonary nodules. The lesion along the azygoesophageal  recess is markedly reduced in size compared to 05/29/2019, with some regional atelectasis obscuring any residuum of that nodule and measuring about 0.6 cm in thickness on image 99 series 3 (formerly 0.9 cm in thickness on 05/09/2020). The more posterior nodule is not visible today and was not visible on 05/09/2020. Musculoskeletal: No findings of metastatic disease. Deformity from old manubrial fracture. Thoracic compression fractures most striking at T9 and T12, unchanged from prior. No compelling findings of osseous malignancy. CT ABDOMEN PELVIS FINDINGS Hepatobiliary: Left hepatic lobe cyst. Septic several of the hypodense lesions previously shown on the 05/29/2019 exam have resolved on interval exams and remain occult compatible with affectively treated malignancy. Small peripheral hypodensities in the right hepatic lobe correspond to prior lesions, one of these measures 0.9 cm in short active cysts on image 58 series 2, previously the same on 05/09/2020. No new or enlarging lesions identified. The gallbladder appears unremarkable. Pancreas: Unremarkable Spleen: Unremarkable Adrenals/Urinary Tract: Stable right mid kidney cyst. Prostate gland indents the bladder base. Stomach/Bowel: Mobile cecum with the cecum in the right upper quadrant extending along the elevated right hemidiaphragm. Sigmoid colon diverticulosis Vascular/Lymphatic: Aortoiliac atherosclerotic vascular disease. Reproductive: Stable prostatomegaly. Other: No supplemental non-categorized findings. Musculoskeletal: Similar appearance of remote compression fractures at L3 and L4. Chronic bilateral pars defects at L5 with 8 mm anterolisthesis of L5 on S1. Multilevel lumbar impingement. IMPRESSION: 1. Mostly stable appearance at the sites of prior hepatic and pulmonary metastatic lesions, with minimal residual nodularity at sites of prior pulmonary metastatic lesions, and with some faint linear hypodensity at the site of prior hepatic metastatic  lesions. The only change from the most recent prior is that the thickening along the right lower lobe in the azygoesophageal recess region formerly corresponding to a lung metastasis appears further improved. No new or progressive sites of malignancy identified. 2. Other imaging findings of potential clinical significance: Aortic Atherosclerosis (ICD10-I70.0). Coronary atherosclerosis. Elevated right hemidiaphragm. Mobile cecum. Stable thoracolumbar compression fractures. Old healed manubrial fracture. Prostatomegaly. Sigmoid colon diverticulosis. Multilevel lumbar impingement. Chronic pars defects at L5. Electronically Signed   By: Van Clines M.D.   On: 08/20/2020 09:35     Assessment and plan- Patient is a 87 y.o. male with metastatic melanoma with lung and liver metastases on palliative Keytruda.  He is here for on  treatment assessment prior to cycle 22 of palliative Keytruda  Counts okay to proceed with cycle 22 of palliative Keytruda today.  I will see him back in 3 weeks for cycle 23.  Plan is to continue Keytruda until progression or toxicity which she is tolerating very well without any significant side effects.  TSH is mildly elevated at 4.6 today.  We will repeat it again in 3 weeks.  If it remains persistently elevated I will add a free T4 level and consider starting him on a low-dose levothyroxine for possible autoimmune hypothyroidism   Visit Diagnosis 1. Encounter for antineoplastic immunotherapy   2. Metastatic melanoma ([place])      Dr. Randa Evens, MD, MPH [facility] at [facility] [phone] 09/16/2020 1:02 PM

## 2020-09-16 NOTE — Progress Notes (Signed)
TSH 4.694. Per Lanetta Inch., RN okay to proceed with treatment.

## 2020-10-07 ENCOUNTER — Inpatient Hospital Stay: Payer: Medicare Other

## 2020-10-07 ENCOUNTER — Inpatient Hospital Stay (HOSPITAL_BASED_OUTPATIENT_CLINIC_OR_DEPARTMENT_OTHER): Payer: Medicare Other | Admitting: Oncology

## 2020-10-07 ENCOUNTER — Other Ambulatory Visit: Payer: Self-pay

## 2020-10-07 ENCOUNTER — Encounter: Payer: Self-pay | Admitting: Oncology

## 2020-10-07 VITALS — BP 132/58 | HR 82 | Temp 97.7°F | Resp 16 | Ht 69.0 in | Wt 174.2 lb

## 2020-10-07 DIAGNOSIS — C799 Secondary malignant neoplasm of unspecified site: Secondary | ICD-10-CM

## 2020-10-07 DIAGNOSIS — C439 Malignant melanoma of skin, unspecified: Secondary | ICD-10-CM | POA: Diagnosis not present

## 2020-10-07 DIAGNOSIS — R7989 Other specified abnormal findings of blood chemistry: Secondary | ICD-10-CM

## 2020-10-07 DIAGNOSIS — Z5112 Encounter for antineoplastic immunotherapy: Secondary | ICD-10-CM

## 2020-10-07 LAB — CBC WITH DIFFERENTIAL/PLATELET
Abs Immature Granulocytes: 0.01 10*3/uL (ref 0.00–0.07)
Basophils Absolute: 0 10*3/uL (ref 0.0–0.1)
Basophils Relative: 1 %
Eosinophils Absolute: 0.3 10*3/uL (ref 0.0–0.5)
Eosinophils Relative: 4 %
HCT: 43.9 % (ref 39.0–52.0)
Hemoglobin: 14.1 g/dL (ref 13.0–17.0)
Immature Granulocytes: 0 %
Lymphocytes Relative: 26 %
Lymphs Abs: 2.2 10*3/uL (ref 0.7–4.0)
MCH: 30.7 pg (ref 26.0–34.0)
MCHC: 32.1 g/dL (ref 30.0–36.0)
MCV: 95.4 fL (ref 80.0–100.0)
Monocytes Absolute: 0.5 10*3/uL (ref 0.1–1.0)
Monocytes Relative: 6 %
Neutro Abs: 5.4 10*3/uL (ref 1.7–7.7)
Neutrophils Relative %: 63 %
Platelets: 197 10*3/uL (ref 150–400)
RBC: 4.6 MIL/uL (ref 4.22–5.81)
RDW: 12.5 % (ref 11.5–15.5)
WBC: 8.4 10*3/uL (ref 4.0–10.5)
nRBC: 0 % (ref 0.0–0.2)

## 2020-10-07 LAB — COMPREHENSIVE METABOLIC PANEL
ALT: 13 U/L (ref 0–44)
AST: 27 U/L (ref 15–41)
Albumin: 3.8 g/dL (ref 3.5–5.0)
Alkaline Phosphatase: 112 U/L (ref 38–126)
Anion gap: 8 (ref 5–15)
BUN: 24 mg/dL — ABNORMAL HIGH (ref 8–23)
CO2: 23 mmol/L (ref 22–32)
Calcium: 8.8 mg/dL — ABNORMAL LOW (ref 8.9–10.3)
Chloride: 105 mmol/L (ref 98–111)
Creatinine, Ser: 1.51 mg/dL — ABNORMAL HIGH (ref 0.61–1.24)
GFR, Estimated: 43 mL/min — ABNORMAL LOW (ref >60)
Glucose, Bld: 134 mg/dL — ABNORMAL HIGH (ref 70–99)
Potassium: 3.9 mmol/L (ref 3.5–5.1)
Sodium: 136 mmol/L (ref 135–145)
Total Bilirubin: 0.8 mg/dL (ref 0.3–1.2)
Total Protein: 7.1 g/dL (ref 6.5–8.1)

## 2020-10-07 LAB — TSH: TSH: 3.753 u[IU]/mL (ref 0.350–4.500)

## 2020-10-07 MED ORDER — HEPARIN SOD (PORK) LOCK FLUSH 100 UNIT/ML IV SOLN
INTRAVENOUS | Status: AC
  Administered 2020-10-07: 500 [IU]
  Filled 2020-10-07: qty 5

## 2020-10-07 MED ORDER — SODIUM CHLORIDE 0.9% FLUSH
10.0000 mL | INTRAVENOUS | Status: DC | PRN
  Administered 2020-10-07: 10 mL via INTRAVENOUS
  Filled 2020-10-07: qty 10

## 2020-10-07 MED ORDER — SODIUM CHLORIDE 0.9 % IV SOLN
200.0000 mg | Freq: Once | INTRAVENOUS | Status: AC
  Administered 2020-10-07: 200 mg via INTRAVENOUS
  Filled 2020-10-07: qty 8

## 2020-10-07 MED ORDER — HEPARIN SOD (PORK) LOCK FLUSH 100 UNIT/ML IV SOLN
500.0000 [IU] | Freq: Once | INTRAVENOUS | Status: DC
  Filled 2020-10-07: qty 5

## 2020-10-07 MED ORDER — SODIUM CHLORIDE 0.9 % IV SOLN
Freq: Once | INTRAVENOUS | Status: AC
  Filled 2020-10-07: qty 250

## 2020-10-07 MED ORDER — HEPARIN SOD (PORK) LOCK FLUSH 100 UNIT/ML IV SOLN
500.0000 [IU] | Freq: Once | INTRAVENOUS | Status: AC | PRN
  Filled 2020-10-07: qty 5

## 2020-10-07 NOTE — Progress Notes (Signed)
Cr 1.51. Per Dr. Janese Banks, proceed with treatment.

## 2020-10-07 NOTE — Patient Instructions (Signed)
CANCER CENTER [place] [facility]  Discharge Instructions: Thank you for choosing [facility] to provide your oncology and hematology care.  If you have a lab appointment with the Cancer Center, please go directly to the Cancer Center and check in at the registration area.  Wear comfortable clothing and clothing appropriate for easy access to any Portacath or PICC line.   We strive to give you quality time with your provider. You may need to reschedule your appointment if you arrive late (15 or more minutes).  Arriving late affects you and other patients whose appointments are after yours.  Also, if you miss three or more appointments without notifying the office, you may be dismissed from the clinic at the provider's discretion.      For prescription refill requests, have your pharmacy contact our office and allow 72 hours for refills to be completed.    Today you received the following chemotherapy and/or immunotherapy agents : Keytruda    To help prevent nausea and vomiting after your treatment, we encourage you to take your nausea medication as directed.  BELOW ARE SYMPTOMS THAT SHOULD BE REPORTED IMMEDIATELY: *FEVER GREATER THAN 100.4 F (38 C) OR HIGHER *CHILLS OR SWEATING *NAUSEA AND VOMITING THAT IS NOT CONTROLLED WITH YOUR NAUSEA MEDICATION *UNUSUAL SHORTNESS OF BREATH *UNUSUAL BRUISING OR BLEEDING *URINARY PROBLEMS (pain or burning when urinating, or frequent urination) *BOWEL PROBLEMS (unusual diarrhea, constipation, pain near the anus) TENDERNESS IN MOUTH AND THROAT WITH OR WITHOUT PRESENCE OF ULCERS (sore throat, sores in mouth, or a toothache) UNUSUAL RASH, SWELLING OR PAIN  UNUSUAL VAGINAL DISCHARGE OR ITCHING   Items with * indicate a potential emergency and should be followed up as soon as possible or go to the Emergency Department if any problems should occur.  Please show the CHEMOTHERAPY ALERT CARD or IMMUNOTHERAPY ALERT CARD at check-in  to the Emergency Department and triage nurse.  Should you have questions after your visit or need to cancel or reschedule your appointment, please contact CANCER CENTER [place] [facility]  [phone] and follow the prompts.  Office hours are 8:00 a.m. to 4:30 p.m. Monday - Friday. Please note that voicemails left after 4:00 p.m. may not be returned until the following business day.  We are closed weekends and major holidays. You have access to a nurse at all times for urgent questions. Please call the main number to the clinic [phone] and follow the prompts.  For any non-urgent questions, you may also contact your provider using MyChart. We now offer e-Visits for anyone 18 and older to request care online for non-urgent symptoms. For details visit [email or web address].   Also download the MyChart app! Go to the app store, search "MyChart", open the app, select [facility], and log in with your MyChart username and password.  Due to Covid, a mask is required upon entering the hospital/clinic. If you do not have a mask, one will be given to you upon arrival. For doctor visits, patients may have 1 support person aged 18 or older with them. For treatment visits, patients cannot have anyone with them due to current Covid guidelines and our immunocompromised population.  

## 2020-10-07 NOTE — Progress Notes (Signed)
Doing good and no concerns

## 2020-10-15 ENCOUNTER — Encounter: Payer: Self-pay | Admitting: Oncology

## 2020-10-15 NOTE — Progress Notes (Signed)
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  He has no significant comorbidities other than hyperlipidemia and GERD.  He has a history of superficial skin cancer.  He presented to the ER with symptoms of right-sided flank pain and had a CT renal stone study which showed a 2.1 x 1.1 cm right lower lobe pulmonary nodule.  Also noted to have multiple new ill-defined lesions throughout the right lobe of the liver largest measuring 3.4 x 2.8 cm.  Spleen and pancreas was unremarkable.  Low-attenuation lesion in the right kidney 2.6 x 1.2 cm likely representing a cyst.  No intra-abdominal adenopathy.  Prostate gland is enlarged measuring 6.2 x 6.5 x 5.3 cm.  Patient has some baseline CKD and his creatinine is around 1.3.   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  NGS testing Showed following genomic variants: EPHA3 R136, NF 1R 440, PB RM1 K61, PBR M1 K702 fs, PTPRT Q825, RAD54L Y335A.  PD-L1 60%.  Negative for BRAF.  MRI brain was negative for metastases.   Single agent Beryle Flock started in June 2021  Interval history-patient is tolerating Keytruda well.  Denies any diarrhea nausea vomiting or shortness of breath.  Appetite and weight have remained stable.  Denies any significant chronic low back pain.  ECOG PS- 1 Pain scale- 0 Opioid associated constipation-  no  Review of systems- Review of Systems  Constitutional:  Negative for chills, fever, malaise/fatigue and weight loss.  HENT:  Negative for congestion, ear discharge and nosebleeds.   Eyes:  Negative for blurred vision.  Respiratory:  Negative for cough, hemoptysis, sputum production, shortness of breath and wheezing.   Cardiovascular:  Negative for chest pain, palpitations, orthopnea and claudication.  Gastrointestinal:  Negative for abdominal pain, blood in stool, constipation, diarrhea, heartburn, melena, nausea and vomiting.  Genitourinary:  Negative for dysuria, flank pain, frequency, hematuria and urgency.  Musculoskeletal:  Negative for back pain, joint pain and myalgias.  Skin:  Negative for rash.  Neurological:  Negative for dizziness, tingling, focal weakness, seizures, weakness and headaches.  Endo/Heme/Allergies:  Does not bruise/bleed easily.  Psychiatric/Behavioral:  Negative for depression and suicidal ideas. The patient does not have insomnia.     No Known Allergies   Past Medical History:  Diagnosis Date   Melanoma ([place])      Past Surgical History:  Procedure Laterality Date   CATARACT EXTRACTION Bilateral 2000   HERNIA REPAIR     MELANOMA EXCISION     PORTA CATH INSERTION N/A 06/28/2019   Procedure: PORTA CATH INSERTION;  Surgeon: Algernon Huxley, MD;  Location: [facility];  Service: Cardiovascular;  Laterality: N/A;    Social History   Socioeconomic History   Marital status: Widowed    Spouse name: Not on file   Number of children: Not on file   Years of education: Not on file  Highest education level: Not on file  Occupational History   Not on file  Tobacco Use   Smoking status: Never   Smokeless tobacco: Never  Vaping Use   Vaping Use: Never used  Substance and Sexual Activity   Alcohol use: Never   Drug use: Not Currently   Sexual activity: Not Currently  Other Topics Concern   Not on file  Social History Narrative   Lives at home  by himself    Social Determinants of Health   Financial Resource Strain: Not on file  Food Insecurity: Not on file  Transportation Needs: Not on file  Physical Activity: Not on file  Stress: Not on file  Social Connections: Not on file  Intimate Partner Violence: Not on file    History reviewed. No pertinent family history.   Current Outpatient Medications:    acetaminophen (TYLENOL) 325 MG tablet, Take 650 mg by mouth every 6 (six) hours as needed. , Disp: , Rfl:    Loratadine 10 MG CAPS, Take 1 capsule by mouth daily as needed. , Disp: , Rfl:    ondansetron (ZOFRAN) 8 MG tablet, Take 1 tablet (8 mg total) by mouth 2 (two) times daily as needed (Nausea or vomiting). (Patient not taking: No sig reported), Disp: 30 tablet, Rfl: 1   prochlorperazine (COMPAZINE) 10 MG tablet, Take 1 tablet (10 mg total) by mouth every 6 (six) hours as needed (Nausea or vomiting). (Patient not taking: No sig reported), Disp: 30 tablet, Rfl: 1   traMADol (ULTRAM) 50 MG tablet, TAKE 1 TABLET (50 MG TOTAL) BY MOUTH EVERY 6 (SIX) HOURS AS NEEDED. (Patient not taking: No sig reported), Disp: 60 tablet, Rfl: 0  Physical exam:  Vitals:   10/07/20 0848  BP: (!) 132/58  Pulse: 82  Resp: 16  Temp: 97.7 F (36.5 C)  TempSrc: Tympanic  Weight: 174 lb 3.2 oz (79 kg)  Height: '5\' 9"'  (1.753 m)   Physical Exam Constitutional:      General: He is not in acute distress. Cardiovascular:     Rate and Rhythm: Normal rate and regular rhythm.     Heart sounds: Normal heart sounds.  Pulmonary:     Effort: Pulmonary effort is normal.     Breath sounds: Normal breath sounds.  Abdominal:     General: Bowel sounds are normal.     Palpations: Abdomen is soft.  Skin:    General: Skin is warm and dry.  Neurological:     Mental Status: He is alert and oriented to person, place, and time.     CMP Latest Ref Rng & Units 10/07/2020  Glucose 70 - 99 mg/dL 134(H)  BUN 8 - 23 mg/dL 24(H)  Creatinine 0.61 - 1.24 mg/dL  1.51(H)  Sodium 135 - 145 mmol/L 136  Potassium 3.5 - 5.1 mmol/L 3.9  Chloride 98 - 111 mmol/L 105  CO2 22 - 32 mmol/L 23  Calcium 8.9 - 10.3 mg/dL 8.8(L)  Total Protein 6.5 - 8.1 g/dL 7.1  Total Bilirubin 0.3 - 1.2 mg/dL 0.8  Alkaline Phos 38 - 126 U/L 112  AST 15 - 41 U/L 27  ALT 0 - 44 U/L 13   CBC Latest Ref Rng & Units 10/07/2020  WBC 4.0 - 10.5 K/uL 8.4  Hemoglobin 13.0 - 17.0 g/dL 14.1  Hematocrit 39.0 - 52.0 % 43.9  Platelets 150 - 400 K/uL 197     Assessment and plan- Patient is a 87 y.o. male with metastatic malignant melanoma with liver  and lung metastases here for on treatment assessment prior to next cycle of palliative Keytruda  Counts okay to proceed with cycle 23 of palliative Keytruda today and I will see her back in 3 weeks for cycle 24.  Plan is to continue Keytruda until progression or toxicity.Is tolerating treatment well without any significant side effects.  TSH is normal.  He will need repeat scans in early November 2022.   Visit Diagnosis 1. Encounter for antineoplastic immunotherapy   2. Metastatic melanoma ([place])      Dr. Randa Evens, MD, MPH [facility] at [facility] [phone] 10/15/2020 8:35 AM

## 2020-10-28 ENCOUNTER — Other Ambulatory Visit: Payer: Self-pay

## 2020-10-28 ENCOUNTER — Inpatient Hospital Stay (HOSPITAL_BASED_OUTPATIENT_CLINIC_OR_DEPARTMENT_OTHER): Payer: Medicare Other | Admitting: Oncology

## 2020-10-28 ENCOUNTER — Inpatient Hospital Stay: Payer: Medicare Other

## 2020-10-28 ENCOUNTER — Encounter: Payer: Self-pay | Admitting: Oncology

## 2020-10-28 ENCOUNTER — Inpatient Hospital Stay: Payer: Medicare Other | Attending: Oncology

## 2020-10-28 VITALS — BP 153/72 | HR 62 | Temp 96.0°F | Resp 17 | Wt 175.0 lb

## 2020-10-28 DIAGNOSIS — I129 Hypertensive chronic kidney disease with stage 1 through stage 4 chronic kidney disease, or unspecified chronic kidney disease: Secondary | ICD-10-CM | POA: Insufficient documentation

## 2020-10-28 DIAGNOSIS — Z5112 Encounter for antineoplastic immunotherapy: Secondary | ICD-10-CM | POA: Insufficient documentation

## 2020-10-28 DIAGNOSIS — N400 Enlarged prostate without lower urinary tract symptoms: Secondary | ICD-10-CM | POA: Diagnosis not present

## 2020-10-28 DIAGNOSIS — C787 Secondary malignant neoplasm of liver and intrahepatic bile duct: Secondary | ICD-10-CM | POA: Insufficient documentation

## 2020-10-28 DIAGNOSIS — E785 Hyperlipidemia, unspecified: Secondary | ICD-10-CM | POA: Insufficient documentation

## 2020-10-28 DIAGNOSIS — C7800 Secondary malignant neoplasm of unspecified lung: Secondary | ICD-10-CM | POA: Insufficient documentation

## 2020-10-28 DIAGNOSIS — C439 Malignant melanoma of skin, unspecified: Secondary | ICD-10-CM

## 2020-10-28 DIAGNOSIS — N189 Chronic kidney disease, unspecified: Secondary | ICD-10-CM | POA: Insufficient documentation

## 2020-10-28 DIAGNOSIS — K219 Gastro-esophageal reflux disease without esophagitis: Secondary | ICD-10-CM | POA: Diagnosis not present

## 2020-10-28 LAB — CBC WITH DIFFERENTIAL/PLATELET
Abs Immature Granulocytes: 0.02 10*3/uL (ref 0.00–0.07)
Basophils Absolute: 0 10*3/uL (ref 0.0–0.1)
Basophils Relative: 0 %
Eosinophils Absolute: 0.3 10*3/uL (ref 0.0–0.5)
Eosinophils Relative: 4 %
HCT: 42.2 % (ref 39.0–52.0)
Hemoglobin: 13.8 g/dL (ref 13.0–17.0)
Immature Granulocytes: 0 %
Lymphocytes Relative: 28 %
Lymphs Abs: 2.4 10*3/uL (ref 0.7–4.0)
MCH: 30.9 pg (ref 26.0–34.0)
MCHC: 32.7 g/dL (ref 30.0–36.0)
MCV: 94.4 fL (ref 80.0–100.0)
Monocytes Absolute: 0.7 10*3/uL (ref 0.1–1.0)
Monocytes Relative: 9 %
Neutro Abs: 5 10*3/uL (ref 1.7–7.7)
Neutrophils Relative %: 59 %
Platelets: 187 10*3/uL (ref 150–400)
RBC: 4.47 MIL/uL (ref 4.22–5.81)
RDW: 12.4 % (ref 11.5–15.5)
WBC: 8.5 10*3/uL (ref 4.0–10.5)
nRBC: 0 % (ref 0.0–0.2)

## 2020-10-28 LAB — COMPREHENSIVE METABOLIC PANEL
ALT: 14 U/L (ref 0–44)
AST: 22 U/L (ref 15–41)
Albumin: 3.8 g/dL (ref 3.5–5.0)
Alkaline Phosphatase: 109 U/L (ref 38–126)
Anion gap: 5 (ref 5–15)
BUN: 21 mg/dL (ref 8–23)
CO2: 25 mmol/L (ref 22–32)
Calcium: 8.7 mg/dL — ABNORMAL LOW (ref 8.9–10.3)
Chloride: 108 mmol/L (ref 98–111)
Creatinine, Ser: 1.38 mg/dL — ABNORMAL HIGH (ref 0.61–1.24)
GFR, Estimated: 47 mL/min — ABNORMAL LOW (ref >60)
Glucose, Bld: 64 mg/dL — ABNORMAL LOW (ref 70–99)
Potassium: 4.1 mmol/L (ref 3.5–5.1)
Sodium: 138 mmol/L (ref 135–145)
Total Bilirubin: 0.7 mg/dL (ref 0.3–1.2)
Total Protein: 7.1 g/dL (ref 6.5–8.1)

## 2020-10-28 MED ORDER — SODIUM CHLORIDE 0.9 % IV SOLN
200.0000 mg | Freq: Once | INTRAVENOUS | Status: AC
  Administered 2020-10-28: 200 mg via INTRAVENOUS
  Filled 2020-10-28: qty 8

## 2020-10-28 MED ORDER — HEPARIN SOD (PORK) LOCK FLUSH 100 UNIT/ML IV SOLN
INTRAVENOUS | Status: AC
  Administered 2020-10-28: 500 [IU]
  Filled 2020-10-28: qty 5

## 2020-10-28 MED ORDER — HEPARIN SOD (PORK) LOCK FLUSH 100 UNIT/ML IV SOLN
500.0000 [IU] | Freq: Once | INTRAVENOUS | Status: AC | PRN
  Filled 2020-10-28: qty 5

## 2020-10-28 MED ORDER — SODIUM CHLORIDE 0.9 % IV SOLN
Freq: Once | INTRAVENOUS | Status: AC
  Filled 2020-10-28: qty 250

## 2020-10-28 NOTE — Progress Notes (Signed)
Patient here for oncology follow-up appointment, expresses no complaints or concerns at this time.    

## 2020-10-28 NOTE — Progress Notes (Signed)
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  He has no significant comorbidities other than hyperlipidemia and GERD.  He has a history of superficial skin cancer.  He presented to the ER with symptoms of right-sided flank pain and had a CT renal stone study which showed a 2.1 x 1.1 cm right lower lobe pulmonary nodule.  Also noted to have multiple new ill-defined lesions throughout the right lobe of the liver largest measuring 3.4 x 2.8 cm.  Spleen and pancreas was unremarkable.  Low-attenuation lesion in the right kidney 2.6 x 1.2 cm likely representing a cyst.  No intra-abdominal adenopathy.  Prostate gland is enlarged measuring 6.2 x 6.5 x 5.3 cm.  Patient has some baseline CKD and his creatinine is around 1.3.   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  NGS testing Showed following genomic variants: EPHA3 R136, NF 1R 440, PB RM1 K61, PBR M1 K702 fs, PTPRT F093, RAD54L Y335A.  PD-L1 60%.  Negative for BRAF.  MRI brain was negative for metastases.   Single agent Beryle Flock started in June 2021    Interval history-tolerating Keytruda well without any significant side effects.  Reports no complaints today.  He is independent of his ADLs and IADLs  ECOG PS- 1 Pain scale- 0 Opioid associated constipation- no  Review of systems- Review of Systems   Constitutional:  Negative for chills, fever, malaise/fatigue and weight loss.  HENT:  Negative for congestion, ear discharge and nosebleeds.   Eyes:  Negative for blurred vision.  Respiratory:  Negative for cough, hemoptysis, sputum production, shortness of breath and wheezing.   Cardiovascular:  Negative for chest pain, palpitations, orthopnea and claudication.  Gastrointestinal:  Negative for abdominal pain, blood in stool, constipation, diarrhea, heartburn, melena, nausea and vomiting.  Genitourinary:  Negative for dysuria, flank pain, frequency, hematuria and urgency.  Musculoskeletal:  Negative for back pain, joint pain and myalgias.  Skin:  Negative for rash.  Neurological:  Negative for dizziness, tingling, focal weakness, seizures, weakness and headaches.  Endo/Heme/Allergies:  Does not bruise/bleed easily.  Psychiatric/Behavioral:  Negative for depression and suicidal ideas. The patient does not have insomnia.      No Known Allergies   Past Medical History:  Diagnosis Date   Melanoma ([place])      Past Surgical History:  Procedure Laterality Date   CATARACT EXTRACTION Bilateral 2000   HERNIA REPAIR     MELANOMA EXCISION     PORTA CATH INSERTION N/A 06/28/2019   Procedure: PORTA CATH INSERTION;  Surgeon: Algernon Huxley, MD;  Location: [facility];  Service: Cardiovascular;  Laterality: N/A;    Social History   Socioeconomic History   Marital status: Widowed    Spouse name: Not on file   Number of children: Not on file   Years of education: Not on file   Highest education level: Not  on file  Occupational History   Not on file  Tobacco Use   Smoking status: Never   Smokeless tobacco: Never  Vaping Use   Vaping Use: Never used  Substance and Sexual Activity   Alcohol use: Never   Drug use: Not Currently   Sexual activity: Not Currently  Other Topics Concern   Not on file  Social History Narrative   Lives at home by himself    Social Determinants of  Health   Financial Resource Strain: Not on file  Food Insecurity: Not on file  Transportation Needs: Not on file  Physical Activity: Not on file  Stress: Not on file  Social Connections: Not on file  Intimate Partner Violence: Not on file    History reviewed. No pertinent family history.   Current Outpatient Medications:    acetaminophen (TYLENOL) 325 MG tablet, Take 650 mg by mouth every 6 (six) hours as needed. , Disp: , Rfl:    Loratadine 10 MG CAPS, Take 1 capsule by mouth daily as needed. , Disp: , Rfl:    ondansetron (ZOFRAN) 8 MG tablet, Take 1 tablet (8 mg total) by mouth 2 (two) times daily as needed (Nausea or vomiting). (Patient not taking: No sig reported), Disp: 30 tablet, Rfl: 1   prochlorperazine (COMPAZINE) 10 MG tablet, Take 1 tablet (10 mg total) by mouth every 6 (six) hours as needed (Nausea or vomiting). (Patient not taking: No sig reported), Disp: 30 tablet, Rfl: 1   traMADol (ULTRAM) 50 MG tablet, TAKE 1 TABLET (50 MG TOTAL) BY MOUTH EVERY 6 (SIX) HOURS AS NEEDED. (Patient not taking: No sig reported), Disp: 60 tablet, Rfl: 0 No current facility-administered medications for this visit.  Facility-Administered Medications Ordered in Other Visits:    heparin lock flush 100 unit/mL, 500 Units, Intracatheter, Once PRN, Sindy Guadeloupe, MD   pembrolizumab [facility]) 200 mg in sodium chloride 0.9 % 50 mL chemo infusion, 200 mg, Intravenous, Once, Sindy Guadeloupe, MD  Physical exam:  Vitals:   10/28/20 0937  BP: (!) 153/72  Pulse: 62  Resp: 17  Temp: (!) 96 F (35.6 C)  TempSrc: Tympanic  SpO2: 99%  Weight: 175 lb (79.4 kg)   Physical Exam Constitutional:      General: He is not in acute distress. Cardiovascular:     Rate and Rhythm: Normal rate and regular rhythm.     Heart sounds: Normal heart sounds.  Pulmonary:     Effort: Pulmonary effort is normal.     Breath sounds: Normal breath sounds.  Abdominal:     General: Bowel sounds are normal.      Palpations: Abdomen is soft.  Skin:    General: Skin is warm and dry.  Neurological:     Mental Status: He is alert and oriented to person, place, and time.     CMP Latest Ref Rng & Units 10/28/2020  Glucose 70 - 99 mg/dL 64(L)  BUN 8 - 23 mg/dL 21  Creatinine 0.61 - 1.24 mg/dL 1.38(H)  Sodium 135 - 145 mmol/L 138  Potassium 3.5 - 5.1 mmol/L 4.1  Chloride 98 - 111 mmol/L 108  CO2 22 - 32 mmol/L 25  Calcium 8.9 - 10.3 mg/dL 8.7(L)  Total Protein 6.5 - 8.1 g/dL 7.1  Total Bilirubin 0.3 - 1.2 mg/dL 0.7  Alkaline Phos 38 - 126 U/L 109  AST 15 - 41 U/L 22  ALT 0 - 44 U/L 14   CBC Latest Ref Rng & Units 10/28/2020  WBC 4.0 - 10.5 K/uL 8.5  Hemoglobin 13.0 - 17.0 g/dL 13.8  Hematocrit 39.0 - 52.0 % 42.2  Platelets 150 - 400 K/uL 187     Assessment and plan- Patient is a 87 y.o. male with metastatic malignant melanoma with liver and lung metastases.  Patient is here for on treatment assessment prior to cycle 24 of Keytruda   Counts okay to proceed with cycle 24 of Keytruda today.  I will see him back in 3 weeks with port labs CBC with differential CMP for cycle 25.  TSH from today is pending.  Repeat CT chest abdomen pelvis without contrast in 5 weeks time.  He has baseline CKD which is overall stable.   Visit Diagnosis 1. Metastatic melanoma ([place])   2. Encounter for antineoplastic immunotherapy      Dr. Randa Evens, MD, MPH [facility] at [facility] [phone] 10/28/2020 10:08 AM

## 2020-10-28 NOTE — Patient Instructions (Signed)
CANCER CENTER [place] [facility]  Discharge Instructions: Thank you for choosing [facility] to provide your oncology and hematology care.  If you have a lab appointment with the Cancer Center, please go directly to the Cancer Center and check in at the registration area.  Wear comfortable clothing and clothing appropriate for easy access to any Portacath or PICC line.   We strive to give you quality time with your provider. You may need to reschedule your appointment if you arrive late (15 or more minutes).  Arriving late affects you and other patients whose appointments are after yours.  Also, if you miss three or more appointments without notifying the office, you may be dismissed from the clinic at the provider's discretion.      For prescription refill requests, have your pharmacy contact our office and allow 72 hours for refills to be completed.    Today you received the following chemotherapy and/or immunotherapy agents : Keytruda    To help prevent nausea and vomiting after your treatment, we encourage you to take your nausea medication as directed.  BELOW ARE SYMPTOMS THAT SHOULD BE REPORTED IMMEDIATELY: *FEVER GREATER THAN 100.4 F (38 C) OR HIGHER *CHILLS OR SWEATING *NAUSEA AND VOMITING THAT IS NOT CONTROLLED WITH YOUR NAUSEA MEDICATION *UNUSUAL SHORTNESS OF BREATH *UNUSUAL BRUISING OR BLEEDING *URINARY PROBLEMS (pain or burning when urinating, or frequent urination) *BOWEL PROBLEMS (unusual diarrhea, constipation, pain near the anus) TENDERNESS IN MOUTH AND THROAT WITH OR WITHOUT PRESENCE OF ULCERS (sore throat, sores in mouth, or a toothache) UNUSUAL RASH, SWELLING OR PAIN  UNUSUAL VAGINAL DISCHARGE OR ITCHING   Items with * indicate a potential emergency and should be followed up as soon as possible or go to the Emergency Department if any problems should occur.  Please show the CHEMOTHERAPY ALERT CARD or IMMUNOTHERAPY ALERT CARD at check-in  to the Emergency Department and triage nurse.  Should you have questions after your visit or need to cancel or reschedule your appointment, please contact CANCER CENTER [place] [facility]  [phone] and follow the prompts.  Office hours are 8:00 a.m. to 4:30 p.m. Monday - Friday. Please note that voicemails left after 4:00 p.m. may not be returned until the following business day.  We are closed weekends and major holidays. You have access to a nurse at all times for urgent questions. Please call the main number to the clinic [phone] and follow the prompts.  For any non-urgent questions, you may also contact your provider using MyChart. We now offer e-Visits for anyone 18 and older to request care online for non-urgent symptoms. For details visit [email or web address].   Also download the MyChart app! Go to the app store, search "MyChart", open the app, select [facility], and log in with your MyChart username and password.  Due to Covid, a mask is required upon entering the hospital/clinic. If you do not have a mask, one will be given to you upon arrival. For doctor visits, patients may have 1 support person aged 18 or older with them. For treatment visits, patients cannot have anyone with them due to current Covid guidelines and our immunocompromised population.  

## 2020-11-18 ENCOUNTER — Other Ambulatory Visit: Payer: Self-pay

## 2020-11-18 ENCOUNTER — Inpatient Hospital Stay (HOSPITAL_BASED_OUTPATIENT_CLINIC_OR_DEPARTMENT_OTHER): Payer: Medicare Other | Admitting: Oncology

## 2020-11-18 ENCOUNTER — Inpatient Hospital Stay: Payer: Medicare Other

## 2020-11-18 ENCOUNTER — Encounter: Payer: Self-pay | Admitting: Oncology

## 2020-11-18 ENCOUNTER — Inpatient Hospital Stay: Payer: Medicare Other | Attending: Oncology

## 2020-11-18 VITALS — HR 51

## 2020-11-18 VITALS — BP 128/50 | HR 35 | Temp 96.8°F | Resp 16 | Wt 176.2 lb

## 2020-11-18 DIAGNOSIS — Z5112 Encounter for antineoplastic immunotherapy: Secondary | ICD-10-CM | POA: Diagnosis not present

## 2020-11-18 DIAGNOSIS — M8580 Other specified disorders of bone density and structure, unspecified site: Secondary | ICD-10-CM | POA: Diagnosis not present

## 2020-11-18 DIAGNOSIS — C439 Malignant melanoma of skin, unspecified: Secondary | ICD-10-CM

## 2020-11-18 DIAGNOSIS — N189 Chronic kidney disease, unspecified: Secondary | ICD-10-CM | POA: Insufficient documentation

## 2020-11-18 DIAGNOSIS — R001 Bradycardia, unspecified: Secondary | ICD-10-CM | POA: Insufficient documentation

## 2020-11-18 DIAGNOSIS — Z79899 Other long term (current) drug therapy: Secondary | ICD-10-CM | POA: Diagnosis not present

## 2020-11-18 DIAGNOSIS — N400 Enlarged prostate without lower urinary tract symptoms: Secondary | ICD-10-CM | POA: Diagnosis not present

## 2020-11-18 DIAGNOSIS — C787 Secondary malignant neoplasm of liver and intrahepatic bile duct: Secondary | ICD-10-CM | POA: Insufficient documentation

## 2020-11-18 DIAGNOSIS — K219 Gastro-esophageal reflux disease without esophagitis: Secondary | ICD-10-CM | POA: Insufficient documentation

## 2020-11-18 DIAGNOSIS — E785 Hyperlipidemia, unspecified: Secondary | ICD-10-CM | POA: Insufficient documentation

## 2020-11-18 DIAGNOSIS — I700 Atherosclerosis of aorta: Secondary | ICD-10-CM | POA: Insufficient documentation

## 2020-11-18 DIAGNOSIS — C7800 Secondary malignant neoplasm of unspecified lung: Secondary | ICD-10-CM | POA: Insufficient documentation

## 2020-11-18 DIAGNOSIS — Z85820 Personal history of malignant melanoma of skin: Secondary | ICD-10-CM | POA: Diagnosis not present

## 2020-11-18 LAB — COMPREHENSIVE METABOLIC PANEL
ALT: 14 U/L (ref 0–44)
AST: 21 U/L (ref 15–41)
Albumin: 4 g/dL (ref 3.5–5.0)
Alkaline Phosphatase: 113 U/L (ref 38–126)
Anion gap: 4 — ABNORMAL LOW (ref 5–15)
BUN: 23 mg/dL (ref 8–23)
CO2: 27 mmol/L (ref 22–32)
Calcium: 8.8 mg/dL — ABNORMAL LOW (ref 8.9–10.3)
Chloride: 106 mmol/L (ref 98–111)
Creatinine, Ser: 1.47 mg/dL — ABNORMAL HIGH (ref 0.61–1.24)
GFR, Estimated: 44 mL/min — ABNORMAL LOW (ref >60)
Glucose, Bld: 73 mg/dL (ref 70–99)
Potassium: 4.2 mmol/L (ref 3.5–5.1)
Sodium: 137 mmol/L (ref 135–145)
Total Bilirubin: 0.6 mg/dL (ref 0.3–1.2)
Total Protein: 6.8 g/dL (ref 6.5–8.1)

## 2020-11-18 LAB — CBC WITH DIFFERENTIAL/PLATELET
Abs Immature Granulocytes: 0.01 10*3/uL (ref 0.00–0.07)
Basophils Absolute: 0 10*3/uL (ref 0.0–0.1)
Basophils Relative: 0 %
Eosinophils Absolute: 0.4 10*3/uL (ref 0.0–0.5)
Eosinophils Relative: 5 %
HCT: 40.8 % (ref 39.0–52.0)
Hemoglobin: 13.4 g/dL (ref 13.0–17.0)
Immature Granulocytes: 0 %
Lymphocytes Relative: 24 %
Lymphs Abs: 2.1 10*3/uL (ref 0.7–4.0)
MCH: 30.7 pg (ref 26.0–34.0)
MCHC: 32.8 g/dL (ref 30.0–36.0)
MCV: 93.6 fL (ref 80.0–100.0)
Monocytes Absolute: 0.7 10*3/uL (ref 0.1–1.0)
Monocytes Relative: 8 %
Neutro Abs: 5.5 10*3/uL (ref 1.7–7.7)
Neutrophils Relative %: 63 %
Platelets: 192 10*3/uL (ref 150–400)
RBC: 4.36 MIL/uL (ref 4.22–5.81)
RDW: 12.3 % (ref 11.5–15.5)
WBC: 8.8 10*3/uL (ref 4.0–10.5)
nRBC: 0 % (ref 0.0–0.2)

## 2020-11-18 MED ORDER — HEPARIN SOD (PORK) LOCK FLUSH 100 UNIT/ML IV SOLN
500.0000 [IU] | Freq: Once | INTRAVENOUS | Status: AC | PRN
  Filled 2020-11-18: qty 5

## 2020-11-18 MED ORDER — SODIUM CHLORIDE 0.9 % IV SOLN
Freq: Once | INTRAVENOUS | Status: AC
  Filled 2020-11-18: qty 250

## 2020-11-18 MED ORDER — HEPARIN SOD (PORK) LOCK FLUSH 100 UNIT/ML IV SOLN
INTRAVENOUS | Status: AC
  Administered 2020-11-18: 500 [IU]
  Filled 2020-11-18: qty 5

## 2020-11-18 MED ORDER — SODIUM CHLORIDE 0.9 % IV SOLN
200.0000 mg | Freq: Once | INTRAVENOUS | Status: AC
  Administered 2020-11-18: 200 mg via INTRAVENOUS
  Filled 2020-11-18: qty 8

## 2020-11-18 NOTE — Progress Notes (Signed)
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  He has no significant comorbidities other than hyperlipidemia and GERD.  He has a history of superficial skin cancer.  He presented to the ER with symptoms of right-sided flank pain and had a CT renal stone study which showed a 2.1 x 1.1 cm right lower lobe pulmonary nodule.  Also noted to have multiple new ill-defined lesions throughout the right lobe of the liver largest measuring 3.4 x 2.8 cm.  Spleen and pancreas was unremarkable.  Low-attenuation lesion in the right kidney 2.6 x 1.2 cm likely representing a cyst.  No intra-abdominal adenopathy.  Prostate gland is enlarged measuring 6.2 x 6.5 x 5.3 cm.  Patient has some baseline CKD and his creatinine is around 1.3.   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  NGS testing Showed following genomic variants: EPHA3 R136, NF 1R 440, PB RM1 K61, PBR M1 K702 fs, PTPRT T622, RAD54L Y335A.  PD-L1 60%.  Negative for BRAF.  MRI brain was negative for metastases.   Single agent Beryle Flock started in June 2021      Interval history-patient is doing well and denies any specific complaints at this time.  Denies any nausea vomiting diarrhea.  ECOG PS- 1 Pain scale- 0   Review of systems- Review of Systems  Constitutional:  Negative for chills, fever, malaise/fatigue and  weight loss.  HENT:  Negative for congestion, ear discharge and nosebleeds.   Eyes:  Negative for blurred vision.  Respiratory:  Negative for cough, hemoptysis, sputum production, shortness of breath and wheezing.   Cardiovascular:  Negative for chest pain, palpitations, orthopnea and claudication.  Gastrointestinal:  Negative for abdominal pain, blood in stool, constipation, diarrhea, heartburn, melena, nausea and vomiting.  Genitourinary:  Negative for dysuria, flank pain, frequency, hematuria and urgency.  Musculoskeletal:  Negative for back pain, joint pain and myalgias.  Skin:  Negative for rash.  Neurological:  Negative for dizziness, tingling, focal weakness, seizures, weakness and headaches.  Endo/Heme/Allergies:  Does not bruise/bleed easily.  Psychiatric/Behavioral:  Negative for depression and suicidal ideas. The patient does not have insomnia.      No Known Allergies   Past Medical History:  Diagnosis Date   Melanoma ([place])      Past Surgical History:  Procedure Laterality Date   CATARACT EXTRACTION Bilateral 2000   HERNIA REPAIR     MELANOMA EXCISION     PORTA CATH INSERTION N/A 06/28/2019   Procedure: PORTA CATH INSERTION;  Surgeon: Algernon Huxley, MD;  Location: [place] [facility];  Service: Cardiovascular;  Laterality: N/A;    Social History   Socioeconomic History   Marital status: Widowed    Spouse name: Not on file   Number of children: Not on file   Years of education: Not on file   Highest education level: Not on file  Occupational History   Not on  file  Tobacco Use   Smoking status: Never   Smokeless tobacco: Never  Vaping Use   Vaping Use: Never used  Substance and Sexual Activity   Alcohol use: Never   Drug use: Not Currently   Sexual activity: Not Currently  Other Topics Concern   Not on file  Social History Narrative   Lives at home by himself    Social Determinants of Health   Financial Resource Strain: Not on file  Food Insecurity:  Not on file  Transportation Needs: Not on file  Physical Activity: Not on file  Stress: Not on file  Social Connections: Not on file  Intimate Partner Violence: Not on file    History reviewed. No pertinent family history.   Current Outpatient Medications:    acetaminophen (TYLENOL) 325 MG tablet, Take 650 mg by mouth every 6 (six) hours as needed. , Disp: , Rfl:    Loratadine 10 MG CAPS, Take 1 capsule by mouth daily as needed. , Disp: , Rfl:    ondansetron (ZOFRAN) 8 MG tablet, Take 1 tablet (8 mg total) by mouth 2 (two) times daily as needed (Nausea or vomiting). (Patient not taking: No sig reported), Disp: 30 tablet, Rfl: 1   prochlorperazine (COMPAZINE) 10 MG tablet, Take 1 tablet (10 mg total) by mouth every 6 (six) hours as needed (Nausea or vomiting). (Patient not taking: No sig reported), Disp: 30 tablet, Rfl: 1   traMADol (ULTRAM) 50 MG tablet, TAKE 1 TABLET (50 MG TOTAL) BY MOUTH EVERY 6 (SIX) HOURS AS NEEDED. (Patient not taking: No sig reported), Disp: 60 tablet, Rfl: 0  Physical exam:  Vitals:   11/18/20 0952  BP: (!) 128/50  Pulse: (!) 35  Resp: 16  Temp: (!) 96.8 F (36 C)  SpO2: 97%  Weight: 176 lb 3.2 oz (79.9 kg)   Physical Exam Cardiovascular:     Rate and Rhythm: Bradycardia present. Rhythm irregular.     Heart sounds: Normal heart sounds.  Pulmonary:     Effort: Pulmonary effort is normal.     Breath sounds: Normal breath sounds.  Abdominal:     General: Bowel sounds are normal.     Palpations: Abdomen is soft.  Skin:    General: Skin is warm and dry.  Neurological:     Mental Status: He is alert and oriented to person, place, and time.     CMP Latest Ref Rng & Units 11/18/2020  Glucose 70 - 99 mg/dL 73  BUN 8 - 23 mg/dL 23  Creatinine 0.61 - 1.24 mg/dL 1.47(H)  Sodium 135 - 145 mmol/L 137  Potassium 3.5 - 5.1 mmol/L 4.2  Chloride 98 - 111 mmol/L 106  CO2 22 - 32 mmol/L 27  Calcium 8.9 - 10.3 mg/dL 8.8(L)  Total Protein 6.5 - 8.1 g/dL 6.8   Total Bilirubin 0.3 - 1.2 mg/dL 0.6  Alkaline Phos 38 - 126 U/L 113  AST 15 - 41 U/L 21  ALT 0 - 44 U/L 14   CBC Latest Ref Rng & Units 11/18/2020  WBC 4.0 - 10.5 K/uL 8.8  Hemoglobin 13.0 - 17.0 g/dL 13.4  Hematocrit 39.0 - 52.0 % 40.8  Platelets 150 - 400 K/uL 192     Assessment and plan- Patient is a 87 y.o. male with metastatic malignant melanoma with liver and lung metastases.  He is here for on treatment assessment prior to cycle 25 of Keytruda  Counseling to proceed with cycle 25 of Keytruda today and I will see  him back in 3 weeks for cycle 26.  He will be getting scans prior  CKD stable: Creatinine around 1.5.  Continue to monitor  Asymptomatic bradycardia: Overall stable and patient reports no syncopal episodes.  Continue to monitor   Visit Diagnosis 1. Metastatic melanoma ([place])      Dr. Randa Evens, MD, MPH [facility] [phone] 11/18/2020 12:04 PM

## 2020-11-18 NOTE — Progress Notes (Signed)
8- MD, Dr. Janese Banks, aware of pulse rate: 36 and pulse irregular. Per MD order: proceed with scheduled Keytruda treatment today; recheck pulse post-Keytruda treatment, prior to patient discharge.  1150- Pulse rate: 51 and pulse remains irregular post-Keytruda treatment. Patient is asymptomatic and states, "felling fine." MD, Dr. Janese Banks, notified and aware. Per MD order: patient may be discharged to home at this time.

## 2020-11-18 NOTE — Progress Notes (Signed)
HR 35 with MD visit. Rechecked HR when patient presented to infusion, HR 36. Pulse irregular. Patient asymptomatic. Dr. Janese Banks and team made aware.

## 2020-11-18 NOTE — Patient Instructions (Signed)
[facility] [place] [facility]  Discharge Instructions: Thank you for choosing [facility] to provide your oncology and hematology care.  If you have a lab appointment with the Cancer Center, please go directly to the Cancer Center and check in at the registration area.  Wear comfortable clothing and clothing appropriate for easy access to any Portacath or PICC line.   We strive to give you quality time with your provider. You may need to reschedule your appointment if you arrive late (15 or more minutes).  Arriving late affects you and other patients whose appointments are after yours.  Also, if you miss three or more appointments without notifying the office, you may be dismissed from the clinic at the provider's discretion.      For prescription refill requests, have your pharmacy contact our office and allow 72 hours for refills to be completed.    Today you received the following chemotherapy and/or immunotherapy agents : Keytruda    To help prevent nausea and vomiting after your treatment, we encourage you to take your nausea medication as directed.  BELOW ARE SYMPTOMS THAT SHOULD BE REPORTED IMMEDIATELY: *FEVER GREATER THAN 100.4 F (38 C) OR HIGHER *CHILLS OR SWEATING *NAUSEA AND VOMITING THAT IS NOT CONTROLLED WITH YOUR NAUSEA MEDICATION *UNUSUAL SHORTNESS OF BREATH *UNUSUAL BRUISING OR BLEEDING *URINARY PROBLEMS (pain or burning when urinating, or frequent urination) *BOWEL PROBLEMS (unusual diarrhea, constipation, pain near the anus) TENDERNESS IN MOUTH AND THROAT WITH OR WITHOUT PRESENCE OF ULCERS (sore throat, sores in mouth, or a toothache) UNUSUAL RASH, SWELLING OR PAIN  UNUSUAL VAGINAL DISCHARGE OR ITCHING   Items with * indicate a potential emergency and should be followed up as soon as possible or go to the Emergency Department if any problems should occur.  Please show the CHEMOTHERAPY ALERT CARD or IMMUNOTHERAPY ALERT CARD at check-in  to the Emergency Department and triage nurse.  Should you have questions after your visit or need to cancel or reschedule your appointment, please contact [facility] [place] [facility]  [phone] and follow the prompts.  Office hours are 8:00 a.m. to 4:30 p.m. Monday - Friday. Please note that voicemails left after 4:00 p.m. may not be returned until the following business day.  We are closed weekends and major holidays. You have access to a nurse at all times for urgent questions. Please call the main number to the clinic [phone] and follow the prompts.  For any non-urgent questions, you may also contact your provider using MyChart. We now offer e-Visits for anyone 18 and older to request care online for non-urgent symptoms. For details visit [email or web address].   Also download the MyChart app! Go to the app store, search "MyChart", open the app, select , and log in with your MyChart username and password.  Due to Covid, a mask is required upon entering the hospital/clinic. If you do not have a mask, one will be given to you upon arrival. For doctor visits, patients may have 1 support person aged 18 or older with them. For treatment visits, patients cannot have anyone with them due to current Covid guidelines and our immunocompromised population.  

## 2020-12-02 ENCOUNTER — Ambulatory Visit
Admission: RE | Admit: 2020-12-02 | Discharge: 2020-12-02 | Disposition: A | Discharge status: Home / Self Care | Payer: Medicare Other | Source: Ambulatory Visit | Attending: Oncology | Admitting: Oncology

## 2020-12-02 ENCOUNTER — Other Ambulatory Visit: Payer: Self-pay

## 2020-12-02 ENCOUNTER — Ambulatory Visit: Admission: RE | Admit: 2020-12-02 | Payer: Medicare Other | Source: Ambulatory Visit

## 2020-12-02 DIAGNOSIS — C439 Malignant melanoma of skin, unspecified: Secondary | ICD-10-CM | POA: Insufficient documentation

## 2020-12-09 ENCOUNTER — Inpatient Hospital Stay (HOSPITAL_BASED_OUTPATIENT_CLINIC_OR_DEPARTMENT_OTHER): Payer: Medicare Other | Admitting: Oncology

## 2020-12-09 ENCOUNTER — Inpatient Hospital Stay: Payer: Medicare Other

## 2020-12-09 ENCOUNTER — Encounter: Payer: Self-pay | Admitting: Oncology

## 2020-12-09 ENCOUNTER — Other Ambulatory Visit: Payer: Self-pay

## 2020-12-09 VITALS — BP 107/49 | HR 39 | Temp 97.6°F | Resp 18 | Wt 175.5 lb

## 2020-12-09 VITALS — HR 52

## 2020-12-09 DIAGNOSIS — C439 Malignant melanoma of skin, unspecified: Secondary | ICD-10-CM

## 2020-12-09 DIAGNOSIS — R001 Bradycardia, unspecified: Secondary | ICD-10-CM | POA: Diagnosis not present

## 2020-12-09 DIAGNOSIS — Z5112 Encounter for antineoplastic immunotherapy: Secondary | ICD-10-CM

## 2020-12-09 LAB — CBC WITH DIFFERENTIAL/PLATELET
Abs Immature Granulocytes: 0.01 10*3/uL (ref 0.00–0.07)
Basophils Absolute: 0 10*3/uL (ref 0.0–0.1)
Basophils Relative: 1 %
Eosinophils Absolute: 0.3 10*3/uL (ref 0.0–0.5)
Eosinophils Relative: 4 %
HCT: 40.9 % (ref 39.0–52.0)
Hemoglobin: 13.4 g/dL (ref 13.0–17.0)
Immature Granulocytes: 0 %
Lymphocytes Relative: 21 %
Lymphs Abs: 1.8 10*3/uL (ref 0.7–4.0)
MCH: 31 pg (ref 26.0–34.0)
MCHC: 32.8 g/dL (ref 30.0–36.0)
MCV: 94.7 fL (ref 80.0–100.0)
Monocytes Absolute: 0.7 10*3/uL (ref 0.1–1.0)
Monocytes Relative: 8 %
Neutro Abs: 5.6 10*3/uL (ref 1.7–7.7)
Neutrophils Relative %: 66 %
Platelets: 208 10*3/uL (ref 150–400)
RBC: 4.32 MIL/uL (ref 4.22–5.81)
RDW: 12.2 % (ref 11.5–15.5)
WBC: 8.4 10*3/uL (ref 4.0–10.5)
nRBC: 0 % (ref 0.0–0.2)

## 2020-12-09 LAB — COMPREHENSIVE METABOLIC PANEL
ALT: 15 U/L (ref 0–44)
AST: 22 U/L (ref 15–41)
Albumin: 3.7 g/dL (ref 3.5–5.0)
Alkaline Phosphatase: 108 U/L (ref 38–126)
Anion gap: 10 (ref 5–15)
BUN: 28 mg/dL — ABNORMAL HIGH (ref 8–23)
CO2: 22 mmol/L (ref 22–32)
Calcium: 8.7 mg/dL — ABNORMAL LOW (ref 8.9–10.3)
Chloride: 106 mmol/L (ref 98–111)
Creatinine, Ser: 1.56 mg/dL — ABNORMAL HIGH (ref 0.61–1.24)
GFR, Estimated: 41 mL/min — ABNORMAL LOW (ref >60)
Glucose, Bld: 84 mg/dL (ref 70–99)
Potassium: 4.4 mmol/L (ref 3.5–5.1)
Sodium: 138 mmol/L (ref 135–145)
Total Bilirubin: 0.5 mg/dL (ref 0.3–1.2)
Total Protein: 6.6 g/dL (ref 6.5–8.1)

## 2020-12-09 LAB — TSH: TSH: 3.557 u[IU]/mL (ref 0.350–4.500)

## 2020-12-09 MED ORDER — HEPARIN SOD (PORK) LOCK FLUSH 100 UNIT/ML IV SOLN
500.0000 [IU] | Freq: Once | INTRAVENOUS | Status: AC | PRN
  Administered 2020-12-09: 500 [IU]
  Filled 2020-12-09: qty 5

## 2020-12-09 MED ORDER — SODIUM CHLORIDE 0.9 % IV SOLN
Freq: Once | INTRAVENOUS | Status: AC
  Filled 2020-12-09: qty 250

## 2020-12-09 MED ORDER — SODIUM CHLORIDE 0.9 % IV SOLN
200.0000 mg | Freq: Once | INTRAVENOUS | Status: AC
  Administered 2020-12-09: 200 mg via INTRAVENOUS
  Filled 2020-12-09: qty 8

## 2020-12-09 MED ORDER — SODIUM CHLORIDE 0.9% FLUSH
10.0000 mL | Freq: Once | INTRAVENOUS | Status: AC
  Administered 2020-12-09: 10 mL via INTRAVENOUS
  Filled 2020-12-09: qty 10

## 2020-12-09 NOTE — Progress Notes (Signed)
Hematology/Oncology Consult note [facility]  Telephone:([phone] Fax:(336) [phone]  Patient Care Team: [place] Pink, MD as PCP - General (Family Medicine)   Name of the patient: Omar Schroeder  [id number]  02/23/1924   Date of visit: 12/09/20  Diagnosis- stage IV metastatic malignant melanoma with lung and liver metastases  Chief complaint/ Reason for visit-on treatment assessment prior to cycle 44 of palliative Keytruda  Heme/Onc history: Patient is an 87 year-old male who lives alone and is independent of his ADLs.  He has no significant comorbidities other than hyperlipidemia and GERD.  He has a history of superficial skin cancer.  He presented to the ER with symptoms of right-sided flank pain and had a CT renal stone study which showed a 2.1 x 1.1 cm right lower lobe pulmonary nodule.  Also noted to have multiple new ill-defined lesions throughout the right lobe of the liver largest measuring 3.4 x 2.8 cm.  Spleen and pancreas was unremarkable.  Low-attenuation lesion in the right kidney 2.6 x 1.2 cm likely representing a cyst.  No intra-abdominal adenopathy.  Prostate gland is enlarged measuring 6.2 x 6.5 x 5.3 cm.  Patient has some baseline CKD and his creatinine is around 1.3.   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  NGS testing Showed following genomic variants: EPHA3 R136, NF 1R 440, PB RM1 K61, PBR M1 K702 fs, PTPRT G254, RAD54L Y335A.  PD-L1 60%.  Negative for BRAF.  MRI brain was negative for metastases.   Single agent Beryle Flock started in June 2021  Interval history-patient reports doing well overall.  Denies any specific complaints at this time.  Denies any chest pain cough or syncopal episodes.  He lives alone and independently  ECOG PS- 1 Pain scale- 0   Review of systems- Review of Systems  Constitutional:   Negative for chills, fever, malaise/fatigue and weight loss.  HENT:  Negative for congestion, ear discharge and nosebleeds.   Eyes:  Negative for blurred vision.  Respiratory:  Negative for cough, hemoptysis, sputum production, shortness of breath and wheezing.   Cardiovascular:  Negative for chest pain, palpitations, orthopnea and claudication.  Gastrointestinal:  Negative for abdominal pain, blood in stool, constipation, diarrhea, heartburn, melena, nausea and vomiting.  Genitourinary:  Negative for dysuria, flank pain, frequency, hematuria and urgency.  Musculoskeletal:  Negative for back pain, joint pain and myalgias.  Skin:  Negative for rash.  Neurological:  Negative for dizziness, tingling, focal weakness, seizures, weakness and headaches.  Endo/Heme/Allergies:  Does not bruise/bleed easily.  Psychiatric/Behavioral:  Negative for depression and suicidal ideas. The patient does not have insomnia.     No Known Allergies   Past Medical History:  Diagnosis Date   Melanoma ([place])      Past Surgical History:  Procedure Laterality Date   CATARACT EXTRACTION Bilateral 2000   HERNIA REPAIR     MELANOMA EXCISION     PORTA CATH INSERTION N/A 06/28/2019   Procedure: PORTA CATH INSERTION;  Surgeon: Algernon Huxley, MD;  Location: [place] [facility];  Service: Cardiovascular;  Laterality: N/A;    Social History   Socioeconomic History   Marital status: Widowed    Spouse name: Not on file   Number of children: Not on file   Years of education: Not on file   Highest education level: Not on file  Occupational  History   Not on file  Tobacco Use   Smoking status: Never   Smokeless tobacco: Never  Vaping Use   Vaping Use: Never used  Substance and Sexual Activity   Alcohol use: Never   Drug use: Not Currently   Sexual activity: Not Currently  Other Topics Concern   Not on file  Social History Narrative   Lives at home by himself    Social Determinants of Health   Financial  Resource Strain: Not on file  Food Insecurity: Not on file  Transportation Needs: Not on file  Physical Activity: Not on file  Stress: Not on file  Social Connections: Not on file  Intimate Partner Violence: Not on file    History reviewed. No pertinent family history.   Current Outpatient Medications:    acetaminophen (TYLENOL) 325 MG tablet, Take 650 mg by mouth every 6 (six) hours as needed. , Disp: , Rfl:    Loratadine 10 MG CAPS, Take 1 capsule by mouth daily as needed. , Disp: , Rfl:    ondansetron (ZOFRAN) 8 MG tablet, Take 1 tablet (8 mg total) by mouth 2 (two) times daily as needed (Nausea or vomiting). (Patient not taking: Reported on 11/26/2019), Disp: 30 tablet, Rfl: 1   prochlorperazine (COMPAZINE) 10 MG tablet, Take 1 tablet (10 mg total) by mouth every 6 (six) hours as needed (Nausea or vomiting). (Patient not taking: Reported on 06/28/2019), Disp: 30 tablet, Rfl: 1   traMADol (ULTRAM) 50 MG tablet, TAKE 1 TABLET (50 MG TOTAL) BY MOUTH EVERY 6 (SIX) HOURS AS NEEDED. (Patient not taking: Reported on 05/12/2020), Disp: 60 tablet, Rfl: 0  Physical exam:  Vitals:   12/09/20 1023  BP: (!) 107/49  Pulse: (!) 39  Resp: 18  Temp: 97.6 F (36.4 C)  SpO2: 97%  Weight: 175 lb 8 oz (79.6 kg)   Physical Exam Constitutional:      General: He is not in acute distress. Cardiovascular:     Rate and Rhythm: Regular rhythm. Bradycardia present.     Heart sounds: Normal heart sounds.  Pulmonary:     Effort: Pulmonary effort is normal.     Breath sounds: Normal breath sounds.  Abdominal:     General: Bowel sounds are normal.     Palpations: Abdomen is soft.  Skin:    General: Skin is warm and dry.  Neurological:     Mental Status: He is alert and oriented to person, place, and time.     CMP Latest Ref Rng & Units 12/09/2020  Glucose 70 - 99 mg/dL 84  BUN 8 - 23 mg/dL 28(H)  Creatinine 0.61 - 1.24 mg/dL 1.56(H)  Sodium 135 - 145 mmol/L 138  Potassium 3.5 - 5.1 mmol/L 4.4   Chloride 98 - 111 mmol/L 106  CO2 22 - 32 mmol/L 22  Calcium 8.9 - 10.3 mg/dL 8.7(L)  Total Protein 6.5 - 8.1 g/dL 6.6  Total Bilirubin 0.3 - 1.2 mg/dL 0.5  Alkaline Phos 38 - 126 U/L 108  AST 15 - 41 U/L 22  ALT 0 - 44 U/L 15   CBC Latest Ref Rng & Units 12/09/2020  WBC 4.0 - 10.5 K/uL 8.4  Hemoglobin 13.0 - 17.0 g/dL 13.4  Hematocrit 39.0 - 52.0 % 40.9  Platelets 150 - 400 K/uL 208    No images are attached to the encounter.  CT CHEST ABDOMEN PELVIS WO CONTRAST  Result Date: 12/03/2020 CLINICAL DATA:  History of metastatic melanoma in this 87 year old male. EXAM:  CT CHEST, ABDOMEN AND PELVIS WITHOUT CONTRAST TECHNIQUE: Multidetector CT imaging of the chest, abdomen and pelvis was performed following the standard protocol without IV contrast. COMPARISON:  Comparison is made with August 19, 2020. FINDINGS: CT CHEST FINDINGS Cardiovascular: Calcified atheromatous plaque of the thoracic aorta as before. No aneurysmal dilation. Normal heart size with signs of calcified coronary artery disease as before. Normal caliber central pulmonary vessels. RIGHT-sided Port-A-Cath terminates at the caval to atrial junction. Limited assessment of cardiovascular structures given lack of intravenous contrast. Mediastinum/Nodes: No thoracic inlet, axillary, mediastinal or hilar adenopathy. Esophagus grossly normal. Lungs/Pleura: Elevated RIGHT hemidiaphragm as before with mild juxta diaphragmatic atelectatic changes and scarring at the RIGHT lung base. Airways are patent. Minimal LEFT lower lobe atelectasis.  No effusion. Minimal parenchymal distortion in the RIGHT upper lobe at the site of previous nodule measuring 2-3 mm thickness (image 60/6) Apical nodule (image 28/6) unchanged approximately 3 mm. No signs of RIGHT lower lobe nodularity currently. No new pulmonary nodules. Musculoskeletal: See below for full musculoskeletal details CT ABDOMEN PELVIS FINDINGS Hepatobiliary: Tiny hypodensity in the RIGHT hepatic  lobe is stable (image 58/4) 5 mm. Cyst in the medial segment of the LEFT hepatic lobe also unchanged. Deficient anterior liver with congenital or remote acquired loss of parenchyma along the anterior RIGHT liver and medial LEFT liver is stable and there is no signs of pericholecystic stranding. Pancreas: Pancreatic contour is unchanged without signs of inflammation. Spleen: Normal in size and contour. Adrenals/Urinary Tract: Adrenal glands are normal. Small RIGHT renal cyst is unchanged. Urinary bladder with mild trabeculation without substantial distension with similar appearance to prior imaging. Cortical scarring of the bilateral kidneys is similar as well to previous imaging. Stomach/Bowel: Colonic diverticulosis.: Extends into the RIGHT upper abdomen beneath the elevated RIGHT hemidiaphragm and anterior to the liver as on previous imaging. No discrete colonic lesion is visualized. No acute gastrointestinal process. Appendix is normal. Vascular/Lymphatic: Aortic atherosclerosis. No sign of aneurysm. Smooth contour of the IVC. There is no gastrohepatic or hepatoduodenal ligament lymphadenopathy. No retroperitoneal or mesenteric lymphadenopathy. No pelvic sidewall lymphadenopathy. Limited assessment of vascular structures in the setting of noncontrast evaluation. Reproductive: Prostatomegaly as before. Other: No ascites. Musculoskeletal: Osteopenia. No acute bone finding. No destructive bone process. Spinal degenerative changes. Signs of compression fractures in the lower lumbar spine with similar appearance also compression fractures in mid and lower thoracic spine without change. IMPRESSION: No new or progressive findings with stable small pulmonary nodules and sequela of prior hepatic metastases. Elevated RIGHT hemidiaphragm with mild juxta diaphragmatic atelectatic changes and scarring at the RIGHT lung base as before. Colonic diverticulosis without evidence of acute gastrointestinal process. Prostatomegaly as  before. Aortic Atherosclerosis (ICD10-I70.0). Electronically Signed   By: Zetta Bills M.D.   On: 12/03/2020 15:14     Assessment and plan- Patient is a 87 y.o. male with metastatic malignant melanoma with liver and lung metastases.  He is here for on treatment assessment prior to cycle 26 of Keytruda  Counts okay to proceed with cycle 26 of Keytruda today.  He will directly proceed for cycle 627 in 3 weeks and I will see him back in 6 weeks for cycle 28.Reviewed recent CT chest abdomen pelvis with contrast findings with the patient which continues to show overall stable pulmonary nodules and hepatic metastases but no concern for new or progressive disease.  Plan is to continue Keytruda until progression or toxicity.  Bradycardia: On manual check patient's heart rate was in the 50s.  His heart  rate has been fluctuating between 30s to 50s but patient has remained asymptomatic with no syncopal episodes.  Possibly secondary to sick sinus syndrome given his age.  Continue to monitor   Visit Diagnosis 1. Encounter for antineoplastic immunotherapy   2. Bradycardia   3. Metastatic melanoma ([place])      Dr. Randa Evens, MD, MPH [facility] at [facility] [phone] 12/09/2020 12:46 PM

## 2020-12-09 NOTE — Progress Notes (Signed)
Labs and vitals reviewed with MD. Per MD to proceed with treatment.   Omar Schroeder [employer]

## 2020-12-09 NOTE — Patient Instructions (Signed)
[facility]  Discharge Instructions: Thank you for choosing [facility] to provide your oncology and hematology care.  If you have a lab appointment with the Cancer Center, please go directly to the Cancer Center and check in at the registration area.  Wear comfortable clothing and clothing appropriate for easy access to any Portacath or PICC line.   We strive to give you quality time with your provider. You may need to reschedule your appointment if you arrive late (15 or more minutes).  Arriving late affects you and other patients whose appointments are after yours.  Also, if you miss three or more appointments without notifying the office, you may be dismissed from the clinic at the provider's discretion.      For prescription refill requests, have your pharmacy contact our office and allow 72 hours for refills to be completed.    Today you received the following chemotherapy and/or immunotherapy agents keytruda   To help prevent nausea and vomiting after your treatment, we encourage you to take your nausea medication as directed.  BELOW ARE SYMPTOMS THAT SHOULD BE REPORTED IMMEDIATELY: *FEVER GREATER THAN 100.4 F (38 C) OR HIGHER *CHILLS OR SWEATING *NAUSEA AND VOMITING THAT IS NOT CONTROLLED WITH YOUR NAUSEA MEDICATION *UNUSUAL SHORTNESS OF BREATH *UNUSUAL BRUISING OR BLEEDING *URINARY PROBLEMS (pain or burning when urinating, or frequent urination) *BOWEL PROBLEMS (unusual diarrhea, constipation, pain near the anus) TENDERNESS IN MOUTH AND THROAT WITH OR WITHOUT PRESENCE OF ULCERS (sore throat, sores in mouth, or a toothache) UNUSUAL RASH, SWELLING OR PAIN  UNUSUAL VAGINAL DISCHARGE OR ITCHING   Items with * indicate a potential emergency and should be followed up as soon as possible or go to the Emergency Department if any problems should occur.  Please show the CHEMOTHERAPY ALERT CARD or IMMUNOTHERAPY ALERT CARD at check-in to  the Emergency Department and triage nurse.  Should you have questions after your visit or need to cancel or reschedule your appointment, please contact [facility]  [phone] and follow the prompts.  Office hours are 8:00 a.m. to 4:30 p.m. Monday - Friday. Please note that voicemails left after 4:00 p.m. may not be returned until the following business day.  We are closed weekends and major holidays. You have access to a nurse at all times for urgent questions. Please call the main number to the clinic [phone] and follow the prompts.  For any non-urgent questions, you may also contact your provider using MyChart. We now offer e-Visits for anyone 18 and older to request care online for non-urgent symptoms. For details visit [email or web address].   Also download the MyChart app! Go to the app store, search "MyChart", open the app, select [facility], and log in with your MyChart username and password.  Due to Covid, a mask is required upon entering the hospital/clinic. If you do not have a mask, one will be given to you upon arrival. For doctor visits, patients may have 1 support person aged 18 or older with them. For treatment visits, patients cannot have anyone with them due to current Covid guidelines and our immunocompromised population.  

## 2020-12-27 ENCOUNTER — Other Ambulatory Visit: Payer: Self-pay

## 2020-12-30 ENCOUNTER — Inpatient Hospital Stay: Payer: Medicare Other | Attending: Oncology

## 2020-12-30 ENCOUNTER — Inpatient Hospital Stay: Payer: Medicare Other

## 2020-12-30 ENCOUNTER — Other Ambulatory Visit: Payer: Self-pay

## 2020-12-30 VITALS — BP 139/56 | HR 56 | Temp 96.1°F | Resp 20 | Wt 176.0 lb

## 2020-12-30 DIAGNOSIS — C439 Malignant melanoma of skin, unspecified: Secondary | ICD-10-CM | POA: Insufficient documentation

## 2020-12-30 DIAGNOSIS — Z5112 Encounter for antineoplastic immunotherapy: Secondary | ICD-10-CM | POA: Insufficient documentation

## 2020-12-30 DIAGNOSIS — C7800 Secondary malignant neoplasm of unspecified lung: Secondary | ICD-10-CM | POA: Diagnosis present

## 2020-12-30 DIAGNOSIS — C787 Secondary malignant neoplasm of liver and intrahepatic bile duct: Secondary | ICD-10-CM | POA: Diagnosis not present

## 2020-12-30 LAB — COMPREHENSIVE METABOLIC PANEL
ALT: 13 U/L (ref 0–44)
AST: 26 U/L (ref 15–41)
Albumin: 3.9 g/dL (ref 3.5–5.0)
Alkaline Phosphatase: 130 U/L — ABNORMAL HIGH (ref 38–126)
Anion gap: 6 (ref 5–15)
BUN: 22 mg/dL (ref 8–23)
CO2: 25 mmol/L (ref 22–32)
Calcium: 8.5 mg/dL — ABNORMAL LOW (ref 8.9–10.3)
Chloride: 105 mmol/L (ref 98–111)
Creatinine, Ser: 1.32 mg/dL — ABNORMAL HIGH (ref 0.61–1.24)
GFR, Estimated: 50 mL/min — ABNORMAL LOW (ref >60)
Glucose, Bld: 156 mg/dL — ABNORMAL HIGH (ref 70–99)
Potassium: 4.4 mmol/L (ref 3.5–5.1)
Sodium: 136 mmol/L (ref 135–145)
Total Bilirubin: 0.5 mg/dL (ref 0.3–1.2)
Total Protein: 6.9 g/dL (ref 6.5–8.1)

## 2020-12-30 LAB — CBC WITH DIFFERENTIAL/PLATELET
Abs Immature Granulocytes: 0.02 10*3/uL (ref 0.00–0.07)
Basophils Absolute: 0 10*3/uL (ref 0.0–0.1)
Basophils Relative: 0 %
Eosinophils Absolute: 0.4 10*3/uL (ref 0.0–0.5)
Eosinophils Relative: 4 %
HCT: 41.5 % (ref 39.0–52.0)
Hemoglobin: 13.4 g/dL (ref 13.0–17.0)
Immature Granulocytes: 0 %
Lymphocytes Relative: 21 %
Lymphs Abs: 1.7 10*3/uL (ref 0.7–4.0)
MCH: 30.5 pg (ref 26.0–34.0)
MCHC: 32.3 g/dL (ref 30.0–36.0)
MCV: 94.3 fL (ref 80.0–100.0)
Monocytes Absolute: 0.4 10*3/uL (ref 0.1–1.0)
Monocytes Relative: 5 %
Neutro Abs: 5.6 10*3/uL (ref 1.7–7.7)
Neutrophils Relative %: 70 %
Platelets: 181 10*3/uL (ref 150–400)
RBC: 4.4 MIL/uL (ref 4.22–5.81)
RDW: 12.3 % (ref 11.5–15.5)
WBC: 8 10*3/uL (ref 4.0–10.5)
nRBC: 0 % (ref 0.0–0.2)

## 2020-12-30 MED ORDER — SODIUM CHLORIDE 0.9 % IV SOLN
200.0000 mg | Freq: Once | INTRAVENOUS | Status: AC
  Administered 2020-12-30: 14:00:00 200 mg via INTRAVENOUS
  Filled 2020-12-30: qty 8

## 2020-12-30 MED ORDER — SODIUM CHLORIDE 0.9 % IV SOLN
Freq: Once | INTRAVENOUS | Status: AC
  Filled 2020-12-30: qty 250

## 2020-12-30 MED ORDER — SODIUM CHLORIDE 0.9% FLUSH
10.0000 mL | INTRAVENOUS | Status: DC | PRN
  Administered 2020-12-30: 13:00:00 10 mL via INTRAVENOUS
  Filled 2020-12-30: qty 10

## 2020-12-30 MED ORDER — HEPARIN SOD (PORK) LOCK FLUSH 100 UNIT/ML IV SOLN
500.0000 [IU] | Freq: Once | INTRAVENOUS | Status: AC
  Filled 2020-12-30: qty 5

## 2020-12-30 MED ORDER — HEPARIN SOD (PORK) LOCK FLUSH 100 UNIT/ML IV SOLN
INTRAVENOUS | Status: AC
  Administered 2020-12-30: 15:00:00 500 [IU] via INTRAVENOUS
  Filled 2020-12-30: qty 5

## 2020-12-30 NOTE — Patient Instructions (Signed)
[facility]   ?Discharge Instructions: ?Thank you for choosing [facility] to provide your oncology and hematology care.  ?If you have a lab appointment with the Cancer Center, please go directly to the Cancer Center and check in at the registration area. ?  ?Wear comfortable clothing and clothing appropriate for easy access to any Portacath or PICC line.  ? ?We strive to give you quality time with your provider. You may need to reschedule your appointment if you arrive late (15 or more minutes).  Arriving late affects you and other patients whose appointments are after yours.  Also, if you miss three or more appointments without notifying the office, you may be dismissed from the clinic at the provider?s discretion.    ?  ?For prescription refill requests, have your pharmacy contact our office and allow 72 hours for refills to be completed.   ? ?Today you received the following chemotherapy and/or immunotherapy agents: Keytruda.    ?  ?To help prevent nausea and vomiting after your treatment, we encourage you to take your nausea medication as directed. ? ?BELOW ARE SYMPTOMS THAT SHOULD BE REPORTED IMMEDIATELY: ?*FEVER GREATER THAN 100.4 F (38 ?C) OR HIGHER ?*CHILLS OR SWEATING ?*NAUSEA AND VOMITING THAT IS NOT CONTROLLED WITH YOUR NAUSEA MEDICATION ?*UNUSUAL SHORTNESS OF BREATH ?*UNUSUAL BRUISING OR BLEEDING ?*URINARY PROBLEMS (pain or burning when urinating, or frequent urination) ?*BOWEL PROBLEMS (unusual diarrhea, constipation, pain near the anus) ?TENDERNESS IN MOUTH AND THROAT WITH OR WITHOUT PRESENCE OF ULCERS (sore throat, sores in mouth, or a toothache) ?UNUSUAL RASH, SWELLING OR PAIN  ?UNUSUAL VAGINAL DISCHARGE OR ITCHING  ? ?Items with * indicate a potential emergency and should be followed up as soon as possible or go to the Emergency Department if any problems should occur. ? ?Please show the CHEMOTHERAPY ALERT CARD or IMMUNOTHERAPY ALERT CARD at check-in  to the Emergency Department and triage nurse. ? ?Should you have questions after your visit or need to cancel or reschedule your appointment, please contact [facility]  Dept: [phone]  and follow the prompts.  Office hours are 8:00 a.m. to 4:30 p.m. Monday - Friday. Please note that voicemails left after 4:00 p.m. may not be returned until the following business day.  We are closed weekends and major holidays. You have access to a nurse at all times for urgent questions. Please call the main number to the clinic Dept: [phone] and follow the prompts. ? ?For any non-urgent questions, you may also contact your provider using MyChart. We now offer e-Visits for anyone 18 and older to request care online for non-urgent symptoms. For details visit [email or web address]. ?  ?Also download the MyChart app! Go to the app store, search "MyChart", open the app, select [facility], and log in with your MyChart username and password. ? ?Due to Covid, a mask is required upon entering the hospital/clinic. If you do not have a mask, one will be given to you upon arrival. For doctor visits, patients may have 1 support person aged 18 or older with them. For treatment visits, patients cannot have anyone with them due to current Covid guidelines and our immunocompromised population.  ?

## 2021-01-19 ENCOUNTER — Other Ambulatory Visit: Payer: Self-pay | Admitting: *Deleted

## 2021-01-19 DIAGNOSIS — C439 Malignant melanoma of skin, unspecified: Secondary | ICD-10-CM

## 2021-01-19 DIAGNOSIS — R7989 Other specified abnormal findings of blood chemistry: Secondary | ICD-10-CM

## 2021-01-20 ENCOUNTER — Inpatient Hospital Stay: Payer: Medicare Other

## 2021-01-20 ENCOUNTER — Inpatient Hospital Stay: Payer: Medicare Other | Attending: Oncology

## 2021-01-20 ENCOUNTER — Inpatient Hospital Stay (HOSPITAL_BASED_OUTPATIENT_CLINIC_OR_DEPARTMENT_OTHER): Payer: Medicare Other | Admitting: Oncology

## 2021-01-20 ENCOUNTER — Encounter: Payer: Self-pay | Admitting: Oncology

## 2021-01-20 ENCOUNTER — Other Ambulatory Visit: Payer: Self-pay

## 2021-01-20 VITALS — BP 131/63 | HR 40 | Temp 95.8°F | Resp 16 | Ht 69.0 in | Wt 177.0 lb

## 2021-01-20 DIAGNOSIS — T451X5A Adverse effect of antineoplastic and immunosuppressive drugs, initial encounter: Secondary | ICD-10-CM | POA: Insufficient documentation

## 2021-01-20 DIAGNOSIS — R7989 Other specified abnormal findings of blood chemistry: Secondary | ICD-10-CM

## 2021-01-20 DIAGNOSIS — C787 Secondary malignant neoplasm of liver and intrahepatic bile duct: Secondary | ICD-10-CM | POA: Diagnosis present

## 2021-01-20 DIAGNOSIS — C439 Malignant melanoma of skin, unspecified: Secondary | ICD-10-CM | POA: Insufficient documentation

## 2021-01-20 DIAGNOSIS — L270 Generalized skin eruption due to drugs and medicaments taken internally: Secondary | ICD-10-CM

## 2021-01-20 DIAGNOSIS — Z79899 Other long term (current) drug therapy: Secondary | ICD-10-CM | POA: Insufficient documentation

## 2021-01-20 DIAGNOSIS — C7800 Secondary malignant neoplasm of unspecified lung: Secondary | ICD-10-CM | POA: Insufficient documentation

## 2021-01-20 LAB — CBC WITH DIFFERENTIAL/PLATELET
Abs Immature Granulocytes: 0.01 10*3/uL (ref 0.00–0.07)
Basophils Absolute: 0 10*3/uL (ref 0.0–0.1)
Basophils Relative: 1 %
Eosinophils Absolute: 0.5 10*3/uL (ref 0.0–0.5)
Eosinophils Relative: 7 %
HCT: 39.4 % (ref 39.0–52.0)
Hemoglobin: 12.7 g/dL — ABNORMAL LOW (ref 13.0–17.0)
Immature Granulocytes: 0 %
Lymphocytes Relative: 20 %
Lymphs Abs: 1.5 10*3/uL (ref 0.7–4.0)
MCH: 30.8 pg (ref 26.0–34.0)
MCHC: 32.2 g/dL (ref 30.0–36.0)
MCV: 95.6 fL (ref 80.0–100.0)
Monocytes Absolute: 0.5 10*3/uL (ref 0.1–1.0)
Monocytes Relative: 6 %
Neutro Abs: 4.7 10*3/uL (ref 1.7–7.7)
Neutrophils Relative %: 66 %
Platelets: 209 10*3/uL (ref 150–400)
RBC: 4.12 MIL/uL — ABNORMAL LOW (ref 4.22–5.81)
RDW: 12.5 % (ref 11.5–15.5)
WBC: 7.2 10*3/uL (ref 4.0–10.5)
nRBC: 0 % (ref 0.0–0.2)

## 2021-01-20 LAB — COMPREHENSIVE METABOLIC PANEL
ALT: 14 U/L (ref 0–44)
AST: 23 U/L (ref 15–41)
Albumin: 3.6 g/dL (ref 3.5–5.0)
Alkaline Phosphatase: 119 U/L (ref 38–126)
Anion gap: 3 — ABNORMAL LOW (ref 5–15)
BUN: 31 mg/dL — ABNORMAL HIGH (ref 8–23)
CO2: 25 mmol/L (ref 22–32)
Calcium: 8.3 mg/dL — ABNORMAL LOW (ref 8.9–10.3)
Chloride: 109 mmol/L (ref 98–111)
Creatinine, Ser: 1.3 mg/dL — ABNORMAL HIGH (ref 0.61–1.24)
GFR, Estimated: 51 mL/min — ABNORMAL LOW (ref >60)
Glucose, Bld: 123 mg/dL — ABNORMAL HIGH (ref 70–99)
Potassium: 4 mmol/L (ref 3.5–5.1)
Sodium: 137 mmol/L (ref 135–145)
Total Bilirubin: 0.7 mg/dL (ref 0.3–1.2)
Total Protein: 6.5 g/dL (ref 6.5–8.1)

## 2021-01-20 LAB — TSH: TSH: 3.086 u[IU]/mL (ref 0.350–4.500)

## 2021-01-20 MED ORDER — PREDNISONE 10 MG PO TABS: 10.0000 mg | ORAL_TABLET | Freq: Every day | ORAL | 0 refills | Status: DC

## 2021-01-20 MED ORDER — HEPARIN SOD (PORK) LOCK FLUSH 100 UNIT/ML IV SOLN
500.0000 [IU] | Freq: Once | INTRAVENOUS | Status: AC
  Administered 2021-01-20: 500 [IU] via INTRAVENOUS
  Filled 2021-01-20: qty 5

## 2021-01-20 NOTE — Progress Notes (Signed)
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  He has no significant comorbidities other than hyperlipidemia and GERD.  He has a history of superficial skin cancer.  He presented to the ER with symptoms of right-sided flank pain and had a CT renal stone study which showed a 2.1 x 1.1 cm right lower lobe pulmonary nodule.  Also noted to have multiple new ill-defined lesions throughout the right lobe of the liver largest measuring 3.4 x 2.8 cm.  Spleen and pancreas was unremarkable.  Low-attenuation lesion in the right kidney 2.6 x 1.2 cm likely representing a cyst.  No intra-abdominal adenopathy.  Prostate gland is enlarged measuring 6.2 x 6.5 x 5.3 cm.  Patient has some baseline CKD and his creatinine is around 1.3.   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  NGS testing Showed following genomic variants: EPHA3 R136, NF 1R 440, PB RM1 K61, PBR M1 K702 fs, PTPRT L893, RAD54L Y335A.  PD-L1 60%.  Negative for BRAF.  MRI brain was negative for metastases.   Single agent Beryle Flock started in June 2021  Interval history-patient reports that he developed generalized pruritic skin rash about 2 weeks ago and is gradually getting worse.  Denies other complaints at this time.  Denies any exposure to new  medications or allergens.  Denies any use of new soaps or lotions.  ECOG PS- 1 Pain scale- 0   Review of systems- Review of Systems  Constitutional:  Negative for chills, fever, malaise/fatigue and weight loss.  HENT:  Negative for congestion, ear discharge and nosebleeds.   Eyes:  Negative for blurred vision.  Respiratory:  Negative for cough, hemoptysis, sputum production, shortness of breath and wheezing.   Cardiovascular:  Negative for chest pain, palpitations, orthopnea and claudication.  Gastrointestinal:  Negative for abdominal pain, blood in stool, constipation, diarrhea, heartburn, melena, nausea and vomiting.  Genitourinary:  Negative for dysuria, flank pain, frequency, hematuria and urgency.  Musculoskeletal:  Negative for back pain, joint pain and myalgias.  Skin:  Positive for itching and rash.  Neurological:  Negative for dizziness, tingling, focal weakness, seizures, weakness and headaches.  Endo/Heme/Allergies:  Does not bruise/bleed easily.  Psychiatric/Behavioral:  Negative for depression and suicidal ideas. The patient does not have insomnia.      No Known Allergies   Past Medical History:  Diagnosis Date   Melanoma ([place])      Past Surgical History:  Procedure Laterality Date   CATARACT EXTRACTION Bilateral 2000   HERNIA REPAIR     MELANOMA EXCISION     PORTA CATH INSERTION N/A 06/28/2019   Procedure: PORTA CATH INSERTION;  Surgeon: Algernon Huxley, MD;  Location: [place] [place] [facility];  Service: Cardiovascular;  Laterality: N/A;    Social History   Socioeconomic History   Marital status: Widowed    Spouse name: Not  on file   Number of children: Not on file   Years of education: Not on file   Highest education level: Not on file  Occupational History   Not on file  Tobacco Use   Smoking status: Never   Smokeless tobacco: Never  Vaping Use   Vaping Use: Never used  Substance and Sexual Activity   Alcohol use: Never   Drug use: Not Currently    Sexual activity: Not Currently  Other Topics Concern   Not on file  Social History Narrative   Lives at home by himself    Social Determinants of Health   Financial Resource Strain: Not on file  Food Insecurity: Not on file  Transportation Needs: Not on file  Physical Activity: Not on file  Stress: Not on file  Social Connections: Not on file  Intimate Partner Violence: Not on file    History reviewed. No pertinent family history.   Current Outpatient Medications:    acetaminophen (TYLENOL) 325 MG tablet, Take 650 mg by mouth every 6 (six) hours as needed. , Disp: , Rfl:    Loratadine 10 MG CAPS, Take 1 capsule by mouth daily as needed. , Disp: , Rfl:    predniSONE (DELTASONE) 10 MG tablet, Take 1 tablet (10 mg total) by mouth daily with breakfast. 4 tablets daily x 4 days, 3 tablets daily x 4 day, 2 tablets daily  x 4 days, then 1 tablet x 4 days and with food each dose, Disp: 40 tablet, Rfl: 0   ondansetron (ZOFRAN) 8 MG tablet, Take 1 tablet (8 mg total) by mouth 2 (two) times daily as needed (Nausea or vomiting). (Patient not taking: Reported on 11/26/2019), Disp: 30 tablet, Rfl: 1   prochlorperazine (COMPAZINE) 10 MG tablet, Take 1 tablet (10 mg total) by mouth every 6 (six) hours as needed (Nausea or vomiting). (Patient not taking: Reported on 06/28/2019), Disp: 30 tablet, Rfl: 1   traMADol (ULTRAM) 50 MG tablet, TAKE 1 TABLET (50 MG TOTAL) BY MOUTH EVERY 6 (SIX) HOURS AS NEEDED. (Patient not taking: Reported on 05/12/2020), Disp: 60 tablet, Rfl: 0  Physical exam:  Vitals:   01/20/21 0926  BP: 131/63  Pulse: (!) 40  Resp: 16  Temp: (!) 95.8 F (35.4 C)  TempSrc: Tympanic  SpO2: 99%  Weight: 177 lb (80.3 kg)  Height: _0  (1.753 m)   Physical Exam Constitutional:      General: He is not in acute distress. Cardiovascular:     Rate and Rhythm: Normal rate and regular rhythm.     Heart sounds: Normal heart sounds.  Pulmonary:     Effort: Pulmonary effort is normal.      Breath sounds: Normal breath sounds.  Abdominal:     General: Bowel sounds are normal.     Palpations: Abdomen is soft.  Skin:    Comments: Generalized excoriating skin rash noted over bilateral upper and lower extremities as well as trunk  Neurological:     Mental Status: He is alert and oriented to person, place, and time.     CMP Latest Ref Rng & Units 01/20/2021  Glucose 70 - 99 mg/dL 123(H)  BUN 8 - 23 mg/dL 31(H)  Creatinine 0.61 - 1.24 mg/dL 1.30(H)  Sodium 135 - 145 mmol/L 137  Potassium 3.5 - 5.1 mmol/L 4.0  Chloride 98 - 111 mmol/L 109  CO2 22 - 32 mmol/L 25  Calcium 8.9 - 10.3 mg/dL 8.3(L)  Total Protein 6.5 - 8.1 g/dL 6.5  Total Bilirubin 0.3 - 1.2 mg/dL 0.7  Alkaline Phos 38 - 126 U/L 119  AST 15 - 41 U/L 23  ALT 0 - 44 U/L 14   CBC Latest Ref Rng & Units 01/20/2021  WBC 4.0 - 10.5 K/uL 7.2  Hemoglobin 13.0 - 17.0 g/dL 12.7(L)  Hematocrit 39.0 - 52.0 % 39.4  Platelets 150 - 400 K/uL 209      No results found.   Assessment and plan- Patient is a 88 y.o. male with malignant melanoma metastatic to liver and lung.  He is here for on treatment assessment prior to cycle 28 of Keytruda  Patient has significant excoriating skin rash which is involving his entire body.  No new medications or over-the-counter supplements.  This could be secondary to [facility].  I will hold off on giving him Keytruda today and start him on a tapering dose of prednisone starting at 50 mg which she will taper by 10 mg every 4 days.  I will see him back in 4 weeks time with labs.  If his skin rash resolves completely I will consider rechallenge him with Beryle Flock but if it recurs we may have to hold Keytruda altogether.  Reassess at next visit   Visit Diagnosis 1. Drug-induced skin rash   2. Metastatic melanoma ([place])      Dr. Randa Evens, MD, MPH [facility] at [facility] [phone] 01/20/2021 1:05 PM

## 2021-02-12 ENCOUNTER — Telehealth: Payer: Self-pay | Admitting: Oncology

## 2021-02-12 NOTE — Telephone Encounter (Signed)
Pt called to reschedule his appt for 2-7. Call back at [phone]

## 2021-02-12 NOTE — Telephone Encounter (Signed)
Pt called to reschedule his appt for 2-7. Call back at [phone]

## 2021-02-17 ENCOUNTER — Ambulatory Visit: Payer: Medicare Other

## 2021-02-17 ENCOUNTER — Inpatient Hospital Stay (HOSPITAL_BASED_OUTPATIENT_CLINIC_OR_DEPARTMENT_OTHER): Payer: Medicare Other | Admitting: Oncology

## 2021-02-17 ENCOUNTER — Inpatient Hospital Stay: Payer: Medicare Other | Attending: Oncology

## 2021-02-17 ENCOUNTER — Ambulatory Visit: Payer: Medicare Other | Admitting: Oncology

## 2021-02-17 ENCOUNTER — Other Ambulatory Visit: Payer: Medicare Other

## 2021-02-17 ENCOUNTER — Other Ambulatory Visit: Payer: Self-pay

## 2021-02-17 ENCOUNTER — Inpatient Hospital Stay: Payer: Medicare Other

## 2021-02-17 ENCOUNTER — Encounter: Payer: Self-pay | Admitting: Oncology

## 2021-02-17 VITALS — BP 117/60 | HR 80 | Temp 95.2°F | Resp 16 | Ht 69.0 in | Wt 174.5 lb

## 2021-02-17 DIAGNOSIS — Z79899 Other long term (current) drug therapy: Secondary | ICD-10-CM | POA: Insufficient documentation

## 2021-02-17 DIAGNOSIS — Z7952 Long term (current) use of systemic steroids: Secondary | ICD-10-CM | POA: Diagnosis not present

## 2021-02-17 DIAGNOSIS — E785 Hyperlipidemia, unspecified: Secondary | ICD-10-CM | POA: Insufficient documentation

## 2021-02-17 DIAGNOSIS — R21 Rash and other nonspecific skin eruption: Secondary | ICD-10-CM | POA: Diagnosis not present

## 2021-02-17 DIAGNOSIS — C7800 Secondary malignant neoplasm of unspecified lung: Secondary | ICD-10-CM | POA: Insufficient documentation

## 2021-02-17 DIAGNOSIS — L270 Generalized skin eruption due to drugs and medicaments taken internally: Secondary | ICD-10-CM

## 2021-02-17 DIAGNOSIS — R7989 Other specified abnormal findings of blood chemistry: Secondary | ICD-10-CM

## 2021-02-17 DIAGNOSIS — R946 Abnormal results of thyroid function studies: Secondary | ICD-10-CM | POA: Insufficient documentation

## 2021-02-17 DIAGNOSIS — C787 Secondary malignant neoplasm of liver and intrahepatic bile duct: Secondary | ICD-10-CM | POA: Diagnosis not present

## 2021-02-17 DIAGNOSIS — C439 Malignant melanoma of skin, unspecified: Secondary | ICD-10-CM | POA: Diagnosis not present

## 2021-02-17 DIAGNOSIS — K219 Gastro-esophageal reflux disease without esophagitis: Secondary | ICD-10-CM | POA: Insufficient documentation

## 2021-02-17 LAB — CBC WITH DIFFERENTIAL/PLATELET
Abs Immature Granulocytes: 0.02 10*3/uL (ref 0.00–0.07)
Basophils Absolute: 0 10*3/uL (ref 0.0–0.1)
Basophils Relative: 0 %
Eosinophils Absolute: 0.4 10*3/uL (ref 0.0–0.5)
Eosinophils Relative: 6 %
HCT: 40.9 % (ref 39.0–52.0)
Hemoglobin: 13.1 g/dL (ref 13.0–17.0)
Immature Granulocytes: 0 %
Lymphocytes Relative: 27 %
Lymphs Abs: 1.7 10*3/uL (ref 0.7–4.0)
MCH: 30.5 pg (ref 26.0–34.0)
MCHC: 32 g/dL (ref 30.0–36.0)
MCV: 95.1 fL (ref 80.0–100.0)
Monocytes Absolute: 0.5 10*3/uL (ref 0.1–1.0)
Monocytes Relative: 7 %
Neutro Abs: 3.7 10*3/uL (ref 1.7–7.7)
Neutrophils Relative %: 60 %
Platelets: 216 10*3/uL (ref 150–400)
RBC: 4.3 MIL/uL (ref 4.22–5.81)
RDW: 12.5 % (ref 11.5–15.5)
WBC: 6.3 10*3/uL (ref 4.0–10.5)
nRBC: 0 % (ref 0.0–0.2)

## 2021-02-17 LAB — COMPREHENSIVE METABOLIC PANEL
ALT: 13 U/L (ref 0–44)
AST: 22 U/L (ref 15–41)
Albumin: 3.7 g/dL (ref 3.5–5.0)
Alkaline Phosphatase: 114 U/L (ref 38–126)
Anion gap: 8 (ref 5–15)
BUN: 20 mg/dL (ref 8–23)
CO2: 23 mmol/L (ref 22–32)
Calcium: 8.6 mg/dL — ABNORMAL LOW (ref 8.9–10.3)
Chloride: 105 mmol/L (ref 98–111)
Creatinine, Ser: 1.37 mg/dL — ABNORMAL HIGH (ref 0.61–1.24)
GFR, Estimated: 48 mL/min — ABNORMAL LOW (ref >60)
Glucose, Bld: 124 mg/dL — ABNORMAL HIGH (ref 70–99)
Potassium: 4 mmol/L (ref 3.5–5.1)
Sodium: 136 mmol/L (ref 135–145)
Total Bilirubin: 0.5 mg/dL (ref 0.3–1.2)
Total Protein: 6.7 g/dL (ref 6.5–8.1)

## 2021-02-17 MED ORDER — HEPARIN SOD (PORK) LOCK FLUSH 100 UNIT/ML IV SOLN
500.0000 [IU] | Freq: Once | INTRAVENOUS | Status: AC
  Administered 2021-02-17: 500 [IU] via INTRAVENOUS
  Filled 2021-02-17: qty 5

## 2021-02-17 NOTE — Progress Notes (Signed)
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  He has no significant comorbidities other than hyperlipidemia and GERD.  He has a history of superficial skin cancer.  He presented to the ER with symptoms of right-sided flank pain and had a CT renal stone study which showed a 2.1 x 1.1 cm right lower lobe pulmonary nodule.  Also noted to have multiple new ill-defined lesions throughout the right lobe of the liver largest measuring 3.4 x 2.8 cm.  Spleen and pancreas was unremarkable.  Low-attenuation lesion in the right kidney 2.6 x 1.2 cm likely representing a cyst.  No intra-abdominal adenopathy.  Prostate gland is enlarged measuring 6.2 x 6.5 x 5.3 cm.  Patient has some baseline CKD and his creatinine is around 1.3.   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  NGS testing Showed following genomic variants: EPHA3 R136, NF 1R 440, PB RM1 K61, PBR M1 K702 fs, PTPRT N407, RAD54L Y335A.  PD-L1 60%.  Negative for BRAF.  MRI brain was negative for metastases.   Single agent keytruda started in June 2021.  Treatment on hold since December 2022 due to development of skin rash    Interval history-patient is about to be done with his prednisone taper.  Rash has improved somewhat but patient still has some ongoing  pruritus  ECOG PS- 1 Pain scale- 0   Review of systems- Review of Systems  Constitutional:  Negative for chills, fever, malaise/fatigue and weight loss.  HENT:  Negative for congestion, ear discharge and nosebleeds.   Eyes:  Negative for blurred vision.  Respiratory:  Negative for cough, hemoptysis, sputum production, shortness of breath and wheezing.   Cardiovascular:  Negative for chest pain, palpitations, orthopnea and claudication.  Gastrointestinal:  Negative for abdominal pain, blood in stool, constipation, diarrhea, heartburn, melena, nausea and vomiting.  Genitourinary:  Negative for dysuria, flank pain, frequency, hematuria and urgency.  Musculoskeletal:  Negative for back pain, joint pain and myalgias.  Skin:  Positive for rash.  Neurological:  Negative for dizziness, tingling, focal weakness, seizures, weakness and headaches.  Endo/Heme/Allergies:  Does not bruise/bleed easily.  Psychiatric/Behavioral:  Negative for depression and suicidal ideas. The patient does not have insomnia.      No Known Allergies   Past Medical History:  Diagnosis Date   Melanoma ([place])      Past Surgical History:  Procedure Laterality Date   CATARACT EXTRACTION Bilateral 2000   HERNIA REPAIR     MELANOMA EXCISION     PORTA CATH INSERTION N/A 06/28/2019   Procedure: PORTA CATH INSERTION;  Surgeon: Algernon Huxley, MD;  Location: [facility];  Service: Cardiovascular;  Laterality: N/A;    Social History   Socioeconomic History   Marital status: Widowed    Spouse name: Not on file   Number of children: Not  on file   Years of education: Not on file   Highest education level: Not on file  Occupational History   Not on file  Tobacco Use   Smoking status: Never   Smokeless tobacco: Never  Vaping Use   Vaping Use: Never used  Substance and Sexual Activity   Alcohol use: Never   Drug use: Not Currently   Sexual activity: Not Currently  Other Topics Concern   Not on file   Social History Narrative   Lives at home by himself    Social Determinants of Health   Financial Resource Strain: Not on file  Food Insecurity: Not on file  Transportation Needs: Not on file  Physical Activity: Not on file  Stress: Not on file  Social Connections: Not on file  Intimate Partner Violence: Not on file    History reviewed. No pertinent family history.   Current Outpatient Medications:    acetaminophen (TYLENOL) 325 MG tablet, Take 650 mg by mouth every 6 (six) hours as needed. , Disp: , Rfl:    Loratadine 10 MG CAPS, Take 1 capsule by mouth daily as needed. , Disp: , Rfl:    predniSONE (DELTASONE) 10 MG tablet, Take 1 tablet (10 mg total) by mouth daily with breakfast. 4 tablets daily x 4 days, 3 tablets daily x 4 day, 2 tablets daily  x 4 days, then 1 tablet x 4 days and with food each dose, Disp: 40 tablet, Rfl: 0   ondansetron (ZOFRAN) 8 MG tablet, Take 1 tablet (8 mg total) by mouth 2 (two) times daily as needed (Nausea or vomiting). (Patient not taking: Reported on 11/26/2019), Disp: 30 tablet, Rfl: 1   prochlorperazine (COMPAZINE) 10 MG tablet, Take 1 tablet (10 mg total) by mouth every 6 (six) hours as needed (Nausea or vomiting). (Patient not taking: Reported on 06/28/2019), Disp: 30 tablet, Rfl: 1   traMADol (ULTRAM) 50 MG tablet, TAKE 1 TABLET (50 MG TOTAL) BY MOUTH EVERY 6 (SIX) HOURS AS NEEDED. (Patient not taking: Reported on 05/12/2020), Disp: 60 tablet, Rfl: 0  Physical exam:  Vitals:   02/17/21 0922  BP: 117/60  Pulse: 80  Resp: 16  Temp: (!) 95.2 F (35.1 C)  TempSrc: Tympanic  SpO2: 100%  Weight: 174 lb 8 oz (79.2 kg)  Height: '5\' 9"'  (1.753 m)   Physical Exam Constitutional:      General: He is not in acute distress. Cardiovascular:     Rate and Rhythm: Normal rate and regular rhythm.     Heart sounds: Normal heart sounds.  Pulmonary:     Effort: Pulmonary effort is normal.     Breath sounds: Normal breath sounds.  Abdominal:     General:  Bowel sounds are normal.     Palpations: Abdomen is soft.  Skin:    General: Skin is warm and dry.     Comments: Scattered maculopapular rash noted over bilateral arms, legs as well as back.  Some areas have a flaky appearance with superimposed marks from scratching and pruritus  Neurological:     Mental Status: He is alert and oriented to person, place, and time.     CMP Latest Ref Rng & Units 02/17/2021  Glucose 70 - 99 mg/dL 124(H)  BUN 8 - 23 mg/dL 20  Creatinine 0.61 - 1.24 mg/dL 1.37(H)  Sodium 135 - 145 mmol/L 136  Potassium 3.5 - 5.1 mmol/L 4.0  Chloride 98 - 111 mmol/L 105  CO2 22 - 32 mmol/L 23  Calcium  8.9 - 10.3 mg/dL 8.6(L)  Total Protein 6.5 - 8.1 g/dL 6.7  Total Bilirubin 0.3 - 1.2 mg/dL 0.5  Alkaline Phos 38 - 126 U/L 114  AST 15 - 41 U/L 22  ALT 0 - 44 U/L 13   CBC Latest Ref Rng & Units 02/17/2021  WBC 4.0 - 10.5 K/uL 6.3  Hemoglobin 13.0 - 17.0 g/dL 13.1  Hematocrit 39.0 - 52.0 % 40.9  Platelets 150 - 400 K/uL 216     Assessment and plan- Patient is a 88 y.o. male with malignant melanoma metastatic to liver and lung.  He is here for routine follow-up of skin rash  As compared to a month ago his rash seems to have evolved.  There are less areas of erythema and presently we see more areas of excoriation and skin flaking.  He is about to finish his prednisone taper.  I will hold off on restarting Keytruda at this time.  I have asked him to use cerave lotion to keep his body moisturized.  I will be repeating CT chest abdomen pelvis with contrast sometime end of March 2023 and see him thereafter.  If scans continue to show good response with stable disease I will hold off on rechallenging him with Keytruda at this time.  If the rash worsens after coming off prednisone he will let us know and I will refer him to dermatology   Visit Diagnosis 1. Metastatic melanoma ([facility])   2. Elevated TSH   3. Drug-induced skin rash      Dr. Randa Evens, MD, MPH [facility] [phone] 02/17/2021 9:46 AM

## 2021-04-08 ENCOUNTER — Ambulatory Visit
Admission: RE | Admit: 2021-04-08 | Discharge: 2021-04-08 | Disposition: A | Discharge status: Home / Self Care | Payer: Medicare Other | Source: Ambulatory Visit | Attending: Oncology | Admitting: Oncology

## 2021-04-08 ENCOUNTER — Other Ambulatory Visit: Payer: Self-pay

## 2021-04-08 DIAGNOSIS — C439 Malignant melanoma of skin, unspecified: Secondary | ICD-10-CM | POA: Diagnosis present

## 2021-04-08 LAB — POCT I-STAT CREATININE: Creatinine, Ser: 1.5 mg/dL — ABNORMAL HIGH (ref 0.61–1.24)

## 2021-04-08 MED ORDER — IOHEXOL 300 MG/ML  SOLN
100.0000 mL | Freq: Once | INTRAMUSCULAR | Status: AC | PRN
  Administered 2021-04-08: 80 mL via INTRAVENOUS

## 2021-04-10 ENCOUNTER — Inpatient Hospital Stay (HOSPITAL_BASED_OUTPATIENT_CLINIC_OR_DEPARTMENT_OTHER): Payer: Medicare Other | Admitting: Oncology

## 2021-04-10 ENCOUNTER — Encounter: Payer: Self-pay | Admitting: Oncology

## 2021-04-10 ENCOUNTER — Inpatient Hospital Stay: Payer: Medicare Other | Attending: Oncology

## 2021-04-10 VITALS — BP 130/67 | HR 57 | Temp 97.5°F | Resp 18 | Wt 174.5 lb

## 2021-04-10 DIAGNOSIS — C439 Malignant melanoma of skin, unspecified: Secondary | ICD-10-CM | POA: Diagnosis present

## 2021-04-10 DIAGNOSIS — R21 Rash and other nonspecific skin eruption: Secondary | ICD-10-CM | POA: Diagnosis not present

## 2021-04-10 DIAGNOSIS — C7800 Secondary malignant neoplasm of unspecified lung: Secondary | ICD-10-CM | POA: Diagnosis present

## 2021-04-10 DIAGNOSIS — N400 Enlarged prostate without lower urinary tract symptoms: Secondary | ICD-10-CM | POA: Diagnosis not present

## 2021-04-10 DIAGNOSIS — K219 Gastro-esophageal reflux disease without esophagitis: Secondary | ICD-10-CM | POA: Diagnosis not present

## 2021-04-10 DIAGNOSIS — Z7189 Other specified counseling: Secondary | ICD-10-CM

## 2021-04-10 DIAGNOSIS — C787 Secondary malignant neoplasm of liver and intrahepatic bile duct: Secondary | ICD-10-CM | POA: Insufficient documentation

## 2021-04-10 DIAGNOSIS — N189 Chronic kidney disease, unspecified: Secondary | ICD-10-CM | POA: Diagnosis not present

## 2021-04-10 DIAGNOSIS — M8580 Other specified disorders of bone density and structure, unspecified site: Secondary | ICD-10-CM | POA: Insufficient documentation

## 2021-04-10 DIAGNOSIS — I700 Atherosclerosis of aorta: Secondary | ICD-10-CM | POA: Diagnosis not present

## 2021-04-10 DIAGNOSIS — Z95828 Presence of other vascular implants and grafts: Secondary | ICD-10-CM

## 2021-04-10 DIAGNOSIS — E785 Hyperlipidemia, unspecified: Secondary | ICD-10-CM | POA: Diagnosis not present

## 2021-04-10 DIAGNOSIS — R7989 Other specified abnormal findings of blood chemistry: Secondary | ICD-10-CM

## 2021-04-10 LAB — CBC WITH DIFFERENTIAL/PLATELET
Abs Immature Granulocytes: 0.03 10*3/uL (ref 0.00–0.07)
Basophils Absolute: 0 10*3/uL (ref 0.0–0.1)
Basophils Relative: 0 %
Eosinophils Absolute: 0.3 10*3/uL (ref 0.0–0.5)
Eosinophils Relative: 4 %
HCT: 39.8 % (ref 39.0–52.0)
Hemoglobin: 12.9 g/dL — ABNORMAL LOW (ref 13.0–17.0)
Immature Granulocytes: 1 %
Lymphocytes Relative: 20 %
Lymphs Abs: 1.3 10*3/uL (ref 0.7–4.0)
MCH: 30.4 pg (ref 26.0–34.0)
MCHC: 32.4 g/dL (ref 30.0–36.0)
MCV: 93.9 fL (ref 80.0–100.0)
Monocytes Absolute: 0.5 10*3/uL (ref 0.1–1.0)
Monocytes Relative: 8 %
Neutro Abs: 4.5 10*3/uL (ref 1.7–7.7)
Neutrophils Relative %: 67 %
Platelets: 162 10*3/uL (ref 150–400)
RBC: 4.24 MIL/uL (ref 4.22–5.81)
RDW: 12.7 % (ref 11.5–15.5)
WBC: 6.6 10*3/uL (ref 4.0–10.5)
nRBC: 0 % (ref 0.0–0.2)

## 2021-04-10 LAB — COMPREHENSIVE METABOLIC PANEL
ALT: 14 U/L (ref 0–44)
AST: 28 U/L (ref 15–41)
Albumin: 3.6 g/dL (ref 3.5–5.0)
Alkaline Phosphatase: 98 U/L (ref 38–126)
Anion gap: 5 (ref 5–15)
BUN: 20 mg/dL (ref 8–23)
CO2: 24 mmol/L (ref 22–32)
Calcium: 8.5 mg/dL — ABNORMAL LOW (ref 8.9–10.3)
Chloride: 107 mmol/L (ref 98–111)
Creatinine, Ser: 1.28 mg/dL — ABNORMAL HIGH (ref 0.61–1.24)
GFR, Estimated: 52 mL/min — ABNORMAL LOW (ref >60)
Glucose, Bld: 135 mg/dL — ABNORMAL HIGH (ref 70–99)
Potassium: 4.1 mmol/L (ref 3.5–5.1)
Sodium: 136 mmol/L (ref 135–145)
Total Bilirubin: 0.6 mg/dL (ref 0.3–1.2)
Total Protein: 6.1 g/dL — ABNORMAL LOW (ref 6.5–8.1)

## 2021-04-10 LAB — TSH: TSH: 2.626 u[IU]/mL (ref 0.350–4.500)

## 2021-04-10 MED ORDER — HEPARIN SOD (PORK) LOCK FLUSH 100 UNIT/ML IV SOLN
500.0000 [IU] | Freq: Once | INTRAVENOUS | Status: AC
  Administered 2021-04-10: 500 [IU] via INTRAVENOUS
  Filled 2021-04-10: qty 5

## 2021-04-10 MED ORDER — SODIUM CHLORIDE 0.9% FLUSH
10.0000 mL | Freq: Once | INTRAVENOUS | Status: AC
  Administered 2021-04-10: 10 mL via INTRAVENOUS
  Filled 2021-04-10: qty 10

## 2021-04-12 ENCOUNTER — Encounter: Payer: Self-pay | Admitting: Oncology

## 2021-04-12 NOTE — Progress Notes (Signed)
? ? ? ?Hematology/Oncology Consult note ?[place]  ?[phone]) [phone] [phone] ? ?Patient Care Team: ?Maryland Pink, MD as PCP - General (Family Medicine) ?Sindy Guadeloupe, MD as Consulting Physician (Hematology and Oncology)  ? ?Name of the patient: Omar Schroeder  ?[id number]  ?1924/09/03  ? ?Date of visit: 04/12/21 ? 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 ?Chief complaint/ Reason for visit-scan results and further management ? ?Heme/Onc history: Patient is an 88 year-old male who lives alone and is independent of his ADLs.  He has no significant comorbidities other than hyperlipidemia and GERD.  He has a history of superficial skin cancer.  He presented to the ER with symptoms of right-sided flank pain and had a CT renal stone study which showed a 2.1 x 1.1 cm right lower lobe pulmonary nodule.  Also noted to have multiple new ill-defined lesions throughout the right lobe of the liver largest measuring 3.4 x 2.8 cm.  Spleen and pancreas was unremarkable.  Low-attenuation lesion in the right kidney 2.6 x 1.2 cm likely representing a cyst.  No intra-abdominal adenopathy.  Prostate gland is enlarged measuring 6.2 x 6.5 x 5.3 cm.  Patient has some baseline CKD and his creatinine is around 1.3.   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  ?Liver biopsy was consistent with malignant melanoma.  NGS testing Showed following genomic variants: EPHA3 R136, NF 1R 440, PB RM1 K61, PBR M1 K702 fs, PTPRT Z662, RAD54L Y335A.  PD-L1 60%.  Negative for BRAF.  MRI brain was negative for metastases. ?  ?Single agent [place] started in June 2021.  Treatment on hold since December 2022 due to development of skin rash ? ?Interval history-skin rash has remained stable now.  He is off prednisone.  Does not report any significant pruritus from his skin rash which has not been  bothering him as much.  Denies other complaints at this time and is overall doing well for his age ? ?ECOG PS- 1 ?Pain scale- 0 ? ? ?Review of systems- Review of Systems  ?Constitutional:  Negative for chills, fever, malaise/fatigue and weight loss.  ?HENT:  Negative for congestion, ear discharge and nosebleeds.   ?Eyes:  Negative for blurred vision.  ?Respiratory:  Negative for cough, hemoptysis, sputum production, shortness of breath and wheezing.   ?Cardiovascular:  Negative for chest pain, palpitations, orthopnea and claudication.  ?Gastrointestinal:  Negative for abdominal pain, blood in stool, constipation, diarrhea, heartburn, melena, nausea and vomiting.  ?Genitourinary:  Negative for dysuria, flank pain, frequency, hematuria and urgency.  ?Musculoskeletal:  Negative for back pain, joint pain and myalgias.  ?Skin:  Negative for rash.  ?Neurological:  Negative for dizziness, tingling, focal weakness, seizures, weakness and headaches.  ?Endo/Heme/Allergies:  Does not bruise/bleed easily.  ?Psychiatric/Behavioral:  Negative for depression and suicidal ideas. The patient does not have insomnia.    ? ? ? ?No Known Allergies ? ? ?Past Medical History:  ?Diagnosis Date  ? Melanoma ([place])   ? ? ? ?Past Surgical History:  ?Procedure Laterality Date  ? CATARACT EXTRACTION Bilateral 2000  ? HERNIA REPAIR    ? MELANOMA EXCISION    ? PORTA CATH INSERTION N/A 06/28/2019  ? Procedure: PORTA CATH INSERTION;  Surgeon: Algernon Huxley, MD;  Location: [facility];  Service: Cardiovascular;  Laterality: N/A;  ? ? ?Social History  ? ?Socioeconomic History  ?  Marital status: Widowed  ?  Spouse name: Not on file  ? Number of children: Not on file  ? Years of education: Not on file  ? Highest education level: Not on file  ?Occupational History  ? Not on file  ?Tobacco Use  ? Smoking status: Never  ? Smokeless tobacco: Never  ?Vaping Use  ? Vaping Use: Never used  ?Substance and Sexual Activity  ? Alcohol use: Never  ? Drug use:  Not Currently  ? Sexual activity: Not Currently  ?Other Topics Concern  ? Not on file  ?Social History Narrative  ? Lives at home by himself   ? ?Social Determinants of Health  ? ?Financial Resource Strain: Not on file  ?Food Insecurity: Not on file  ?Transportation Needs: Not on file  ?Physical Activity: Not on file  ?Stress: Not on file  ?Social Connections: Not on file  ?Intimate Partner Violence: Not on file  ? ? ?History reviewed. No pertinent family history. ? ? ?Current Outpatient Medications:  ?  acetaminophen (TYLENOL) 325 MG tablet, Take 650 mg by mouth every 6 (six) hours as needed. , Disp: , Rfl:  ?  Loratadine 10 MG CAPS, Take 1 capsule by mouth daily as needed. , Disp: , Rfl:  ?  ondansetron (ZOFRAN) 8 MG tablet, Take 1 tablet (8 mg total) by mouth 2 (two) times daily as needed (Nausea or vomiting). (Patient not taking: Reported on 11/26/2019), Disp: 30 tablet, Rfl: 1 ?  prochlorperazine (COMPAZINE) 10 MG tablet, Take 1 tablet (10 mg total) by mouth every 6 (six) hours as needed (Nausea or vomiting). (Patient not taking: Reported on 06/28/2019), Disp: 30 tablet, Rfl: 1 ?  traMADol (ULTRAM) 50 MG tablet, TAKE 1 TABLET (50 MG TOTAL) BY MOUTH EVERY 6 (SIX) HOURS AS NEEDED. (Patient not taking: Reported on 05/12/2020), Disp: 60 tablet, Rfl: 0 ? ?Physical exam:  ?Vitals:  ? 04/10/21 1004  ?BP: 130/67  ?Pulse: (!) 57  ?Resp: 18  ?Temp: (!) 97.5 ?F (36.4 ?C)  ?SpO2: 97%  ?Weight: 174 lb 8 oz (79.2 kg)  ? ?Physical Exam ?Constitutional:   ?   General: He is not in acute distress. ?Cardiovascular:  ?   Rate and Rhythm: Normal rate and regular rhythm.  ?   Heart sounds: Normal heart sounds.  ?Pulmonary:  ?   Effort: Pulmonary effort is normal.  ?   Breath sounds: Normal breath sounds.  ?Abdominal:  ?   General: Bowel sounds are normal.  ?   Palpations: Abdomen is soft.  ?Skin: ?   General: Skin is warm and dry.  ?Neurological:  ?   Mental Status: He is alert and oriented to person, place, and time.  ?  ? ? ?   Latest Ref Rng & Units 04/10/2021  ?  9:44 AM  ?CMP  ?Glucose 70 - 99 mg/dL 135    ?BUN 8 - 23 mg/dL 20    ?Creatinine 0.61 - 1.24 mg/dL 1.28    ?Sodium 135 - 145 mmol/L 136    ?Potassium 3.5 - 5.1 mmol/L 4.1    ?Chloride 98 - 111 mmol/L 107    ?CO2 22 - 32 mmol/L 24    ?Calcium 8.9 - 10.3 mg/dL 8.5    ?Total Protein 6.5 - 8.1 g/dL 6.1    ?Total Bilirubin 0.3 - 1.2 mg/dL 0.6    ?Alkaline Phos 38 - 126 U/L 98    ?AST 15 - 41 U/L 28    ?ALT 0 - 44 U/L  14    ? ? ?  Latest Ref Rng & Units 04/10/2021  ?  9:44 AM  ?CBC  ?WBC 4.0 - 10.5 K/uL 6.6    ?Hemoglobin 13.0 - 17.0 g/dL 12.9    ?Hematocrit 39.0 - 52.0 % 39.8    ?Platelets 150 - 400 K/uL 162    ? ? ?No images are attached to the encounter. ? ?CT CHEST ABDOMEN PELVIS W CONTRAST ? ?Result Date: 04/09/2021 ?CLINICAL DATA:  Metastatic melanoma. Asymptomatic. Currently on therapy. Known liver metastasis. Known lung metastasis. * Tracking Code: BO * EXAM: CT CHEST, ABDOMEN, AND PELVIS WITH CONTRAST TECHNIQUE: Multidetector CT imaging of the chest, abdomen and pelvis was performed following the standard protocol during bolus administration of intravenous contrast. RADIATION DOSE REDUCTION: This exam was performed according to the departmental dose-optimization program which includes automated exposure control, adjustment of the mA and/or kV according to patient size and/or use of iterative reconstruction technique. CONTRAST:  50m OMNIPAQUE IOHEXOL 300 MG/ML  SOLN COMPARISON:  12/02/2020 FINDINGS: CT CHEST FINDINGS Cardiovascular: Right Port-A-Cath tip low SVC. Aortic atherosclerosis. Normal heart size, without pericardial effusion. No central pulmonary embolism, on this non-dedicated study. Mediastinum/Nodes: No supraclavicular adenopathy. No mediastinal or hilar adenopathy. Lungs/Pleura: Right hemidiaphragm elevation. No pleural fluid. Volume loss at the right lung base adjacent to the elevated diaphragm. No residual or recurrent pulmonary nodules. Musculoskeletal: No acute  osseous abnormality. Compression deformities, including most significant at T12 and T9 are not significantly changed. S shaped thoracolumbar spine curvature. CT ABDOMEN PELVIS FINDINGS Hepatobiliary: Likely c

## 2021-06-05 ENCOUNTER — Inpatient Hospital Stay: Payer: Medicare Other | Attending: Oncology

## 2021-06-05 DIAGNOSIS — N189 Chronic kidney disease, unspecified: Secondary | ICD-10-CM | POA: Insufficient documentation

## 2021-06-05 DIAGNOSIS — C439 Malignant melanoma of skin, unspecified: Secondary | ICD-10-CM | POA: Diagnosis present

## 2021-06-05 DIAGNOSIS — Z95828 Presence of other vascular implants and grafts: Secondary | ICD-10-CM

## 2021-06-05 DIAGNOSIS — C7801 Secondary malignant neoplasm of right lung: Secondary | ICD-10-CM | POA: Insufficient documentation

## 2021-06-05 DIAGNOSIS — C787 Secondary malignant neoplasm of liver and intrahepatic bile duct: Secondary | ICD-10-CM | POA: Diagnosis not present

## 2021-06-05 MED ORDER — SODIUM CHLORIDE 0.9% FLUSH
10.0000 mL | INTRAVENOUS | Status: DC | PRN
  Administered 2021-06-05: 10 mL via INTRAVENOUS
  Filled 2021-06-05: qty 10

## 2021-06-05 MED ORDER — HEPARIN SOD (PORK) LOCK FLUSH 100 UNIT/ML IV SOLN
500.0000 [IU] | Freq: Once | INTRAVENOUS | Status: AC
  Administered 2021-06-05: 500 [IU] via INTRAVENOUS
  Filled 2021-06-05: qty 5

## 2021-06-18 ENCOUNTER — Telehealth: Payer: Self-pay | Admitting: *Deleted

## 2021-06-18 NOTE — Telephone Encounter (Signed)
Spoke to granddaughter [place]. She is agreeable to [facility] and will inform her grandfather of appointment. Appointment scheduled.

## 2021-06-18 NOTE — Telephone Encounter (Signed)
Patient Omar Schroeder daughter called reporting that patient still has the rash he developed while on Keytruda and she is asking what can be done for it. Lease return her call

## 2021-06-19 ENCOUNTER — Inpatient Hospital Stay: Payer: Medicare Other | Attending: Oncology | Admitting: Medical Oncology

## 2021-06-19 VITALS — BP 114/48 | HR 80 | Temp 97.8°F | Resp 16 | Wt 170.0 lb

## 2021-06-19 DIAGNOSIS — Z9221 Personal history of antineoplastic chemotherapy: Secondary | ICD-10-CM | POA: Insufficient documentation

## 2021-06-19 DIAGNOSIS — R21 Rash and other nonspecific skin eruption: Secondary | ICD-10-CM | POA: Insufficient documentation

## 2021-06-19 DIAGNOSIS — L282 Other prurigo: Secondary | ICD-10-CM | POA: Diagnosis not present

## 2021-06-19 DIAGNOSIS — C439 Malignant melanoma of skin, unspecified: Secondary | ICD-10-CM | POA: Insufficient documentation

## 2021-06-19 DIAGNOSIS — Z85828 Personal history of other malignant neoplasm of skin: Secondary | ICD-10-CM | POA: Diagnosis not present

## 2021-06-19 MED ORDER — TRIAMCINOLONE ACETONIDE 0.5 % EX CREA: 1.0000 "application " | TOPICAL_CREAM | Freq: Two times a day (BID) | CUTANEOUS | 0 refills | Status: DC

## 2021-06-19 NOTE — Progress Notes (Signed)
Symptom Management [place] at [facility] [phone] Fax:(336) [phone]  Patient Care Team: Maryland Pink, MD as PCP - General (Family Medicine) Sindy Guadeloupe, MD as Consulting Physician (Hematology and Oncology)   Name of the patient: Omar Schroeder  [id number]  [birth year]   Date of visit: 06/19/21  Reason for Consult: CRYSTIAN FRITH is a 88 y.o. male who presents today by himself as well as his granddaughter Marzetta Board via telephone for:  Rash: Patient presents today for a slightly itchy rash that he has had for the past few months.  Rash was originally thought to be secondary to [place] which she started in June 2021.  He developed a rash a few months ago and his Beryle Flock was held as of December 2022.  He was started on prednisone for this rash which did help with the rash.  He reports that over the past few weeks the rash has reappeared.  Rash is slightly itchy and only located on his arms and legs.  The rash spares his hands, feet and trunk.  He denies any pain of the rash, discharge from the rash, bleeding from the rash, fevers, headaches.  He has not put anything on the rash.  He is known to pick at the rash often.   Denies any neurologic complaints. Denies recent fevers or illnesses. Denies any easy bleeding or bruising. Reports good appetite and denies weight loss. Denies chest pain. Denies any nausea, vomiting, constipation, or diarrhea. Denies urinary complaints. Patient offers no further specific complaints today.    PAST MEDICAL HISTORY: Past Medical History:  Diagnosis Date   Melanoma ([place])     PAST SURGICAL HISTORY:  Past Surgical History:  Procedure Laterality Date   CATARACT EXTRACTION Bilateral 2000   HERNIA REPAIR     MELANOMA EXCISION     PORTA CATH INSERTION N/A 06/28/2019   Procedure: PORTA CATH INSERTION;  Surgeon: Algernon Huxley, MD;  Location: [place];  Service: Cardiovascular;   Laterality: N/A;    HEMATOLOGY/ONCOLOGY HISTORY:  Oncology History  Metastatic melanoma ([place])  06/18/2019 Initial Diagnosis   Metastatic melanoma ([place])   07/02/2019 -  Chemotherapy   Patient is on Treatment Plan : MELANOMA Pembrolizumab q21d     08/20/2019 Cancer Staging   Staging form: Melanoma of the Skin, AJCC 8th Edition - Clinical stage from 08/20/2019: Stage IV (cTX, cNX, pM1c) - Signed by Sindy Guadeloupe, MD on 09/16/2020     ALLERGIES:  has No Known Allergies.  MEDICATIONS:  Current Outpatient Medications  Medication Sig Dispense Refill   acetaminophen (TYLENOL) 325 MG tablet Take 650 mg by mouth every 6 (six) hours as needed.      Loratadine 10 MG CAPS Take 1 capsule by mouth daily as needed.      triamcinolone cream (KENALOG) 0.5 % Apply 1 application  topically 2 (two) times daily. 454 g 0   ondansetron (ZOFRAN) 8 MG tablet Take 1 tablet (8 mg total) by mouth 2 (two) times daily as needed (Nausea or vomiting). (Patient not taking: Reported on 11/26/2019) 30 tablet 1   prochlorperazine (COMPAZINE) 10 MG tablet Take 1 tablet (10 mg total) by mouth every 6 (six) hours as needed (Nausea or vomiting). (Patient not taking: Reported on 06/28/2019) 30 tablet 1   traMADol (ULTRAM) 50 MG tablet TAKE 1 TABLET (50 MG TOTAL) BY MOUTH EVERY 6 (SIX) HOURS AS NEEDED. (Patient not taking: Reported on 05/12/2020) 60 tablet 0   No  current facility-administered medications for this visit.    VITAL SIGNS: BP (!) 114/48   Pulse 80   Temp 97.8 F (36.6 C) (Oral)   Resp 16   Wt 170 lb (77.1 kg)   SpO2 99%   BMI 25.10 kg/m  Filed Weights   06/19/21 1007  Weight: 170 lb (77.1 kg)    Estimated body mass index is 25.1 kg/m as calculated from the following:   Height as of 02/17/21: '5\' 9"'$  (1.753 m).   Weight as of this encounter: 170 lb (77.1 kg).  LABS: CBC:    Component Value Date/Time   WBC 6.6 04/10/2021 0944   HGB 12.9 (L) 04/10/2021 0944   HCT 39.8 04/10/2021 0944   PLT 162 04/10/2021  0944   MCV 93.9 04/10/2021 0944   NEUTROABS 4.5 04/10/2021 0944   LYMPHSABS 1.3 04/10/2021 0944   MONOABS 0.5 04/10/2021 0944   EOSABS 0.3 04/10/2021 0944   BASOSABS 0.0 04/10/2021 0944   Comprehensive Metabolic Panel:    Component Value Date/Time   NA 136 04/10/2021 0944   K 4.1 04/10/2021 0944   CL 107 04/10/2021 0944   CO2 24 04/10/2021 0944   BUN 20 04/10/2021 0944   CREATININE 1.28 (H) 04/10/2021 0944   GLUCOSE 135 (H) 04/10/2021 0944   CALCIUM 8.5 (L) 04/10/2021 0944   AST 28 04/10/2021 0944   ALT 14 04/10/2021 0944   ALKPHOS 98 04/10/2021 0944   BILITOT 0.6 04/10/2021 0944   PROT 6.1 (L) 04/10/2021 0944   ALBUMIN 3.6 04/10/2021 0944    RADIOGRAPHIC STUDIES: No results found.  PERFORMANCE STATUS (ECOG) : 1 - Symptomatic but completely ambulatory  Review of Systems Unless otherwise noted, a complete review of systems is negative.  Physical Exam General: NAD Cardiovascular: regular rate and rhythm Pulmonary: clear ant fields Abdomen: soft, nontender, + bowel sounds GU: no suprapubic tenderness Extremities: no edema, no joint deformities Skin: See below Neurological: Weakness but otherwise nonfocal         Assessment and Plan- Patient is a 88 y.o. male  Encounter Diagnoses  Name Primary?   History of nonmelanoma skin cancer Yes   Pruritic rash    Acute on chronic.  Patient is actively picking at the rash in the office which I think is a component of his symptoms in addition to the dry skin.  His last TSH was obtained on 04/10/2021 along with a CBC both of which do not show any significant sign of cause for the rash.  What I would like to do is start him on a topical steroid cream that he uses twice a day which will help resolve the rash as well as improve his dry skin which I think is a component in the itchy rash.  I have also placed a referral to dermatology as inferiorly since he has been off of his Beryle Flock since December 2022 he should not be having  increased trouble with this rash at this time.  Patient is agreeable.  Follow-up as needed.    Patient expressed understanding and was in agreement with this plan. He also understands that He can call clinic at any time with any questions, concerns, or complaints.   Thank you for allowing me to participate in the care of this very pleasant patient.   Time Total: 25  Visit consisted of counseling and education dealing with the complex and emotionally intense issues of symptom management in the setting of serious illness.Greater than 50%  of this time was  spent counseling and coordinating care related to the above assessment and plan.  Signed by: Nelwyn Salisbury, PA-C

## 2021-06-19 NOTE — Progress Notes (Signed)
Pt reports persistent rash to bilateral upper and lower extremities. Rash began while on Keytruda. Last dose of Keytruda was Dec 2022. States that the prednisone improved the rash when he took it, but symptoms have returned.

## 2021-07-31 ENCOUNTER — Inpatient Hospital Stay: Payer: Medicare Other | Attending: Oncology

## 2021-07-31 DIAGNOSIS — Z452 Encounter for adjustment and management of vascular access device: Secondary | ICD-10-CM | POA: Insufficient documentation

## 2021-07-31 DIAGNOSIS — Z95828 Presence of other vascular implants and grafts: Secondary | ICD-10-CM

## 2021-07-31 DIAGNOSIS — R21 Rash and other nonspecific skin eruption: Secondary | ICD-10-CM | POA: Diagnosis present

## 2021-07-31 MED ORDER — SODIUM CHLORIDE 0.9% FLUSH
10.0000 mL | Freq: Once | INTRAVENOUS | Status: AC
  Administered 2021-07-31: 10 mL via INTRAVENOUS
  Filled 2021-07-31: qty 10

## 2021-07-31 MED ORDER — HEPARIN SOD (PORK) LOCK FLUSH 100 UNIT/ML IV SOLN
500.0000 [IU] | Freq: Once | INTRAVENOUS | Status: AC
  Administered 2021-07-31: 500 [IU] via INTRAVENOUS
  Filled 2021-07-31: qty 5

## 2021-08-03 ENCOUNTER — Other Ambulatory Visit: Payer: Self-pay

## 2021-08-10 ENCOUNTER — Ambulatory Visit
Admission: RE | Admit: 2021-08-10 | Discharge: 2021-08-10 | Disposition: A | Discharge status: Home / Self Care | Payer: Medicare Other | Source: Ambulatory Visit | Attending: Oncology | Admitting: Oncology

## 2021-08-10 DIAGNOSIS — C439 Malignant melanoma of skin, unspecified: Secondary | ICD-10-CM | POA: Insufficient documentation

## 2021-08-10 MED ORDER — GADOBUTROL 1 MMOL/ML IV SOLN
8.0000 mL | Freq: Once | INTRAVENOUS | Status: AC | PRN
  Administered 2021-08-10: 7.5 mL via INTRAVENOUS

## 2021-08-10 MED ORDER — GADOBUTROL 1 MMOL/ML IV SOLN: 7.0000 mL | Freq: Once | INTRAVENOUS | Status: DC | PRN

## 2021-08-12 ENCOUNTER — Inpatient Hospital Stay: Payer: Medicare Other | Admitting: Oncology

## 2021-08-12 ENCOUNTER — Inpatient Hospital Stay: Payer: Medicare Other

## 2021-08-13 ENCOUNTER — Inpatient Hospital Stay: Payer: Medicare Other | Attending: Oncology

## 2021-08-13 ENCOUNTER — Inpatient Hospital Stay (HOSPITAL_BASED_OUTPATIENT_CLINIC_OR_DEPARTMENT_OTHER): Payer: Medicare Other | Admitting: Oncology

## 2021-08-13 ENCOUNTER — Encounter: Payer: Self-pay | Admitting: Oncology

## 2021-08-13 VITALS — BP 133/48 | HR 46 | Temp 97.1°F | Resp 18 | Wt 164.5 lb

## 2021-08-13 DIAGNOSIS — K219 Gastro-esophageal reflux disease without esophagitis: Secondary | ICD-10-CM | POA: Diagnosis not present

## 2021-08-13 DIAGNOSIS — C7800 Secondary malignant neoplasm of unspecified lung: Secondary | ICD-10-CM | POA: Insufficient documentation

## 2021-08-13 DIAGNOSIS — C439 Malignant melanoma of skin, unspecified: Secondary | ICD-10-CM

## 2021-08-13 DIAGNOSIS — Z79899 Other long term (current) drug therapy: Secondary | ICD-10-CM | POA: Diagnosis not present

## 2021-08-13 DIAGNOSIS — C787 Secondary malignant neoplasm of liver and intrahepatic bile duct: Secondary | ICD-10-CM | POA: Insufficient documentation

## 2021-08-13 DIAGNOSIS — Z85828 Personal history of other malignant neoplasm of skin: Secondary | ICD-10-CM | POA: Insufficient documentation

## 2021-08-13 DIAGNOSIS — E785 Hyperlipidemia, unspecified: Secondary | ICD-10-CM | POA: Insufficient documentation

## 2021-08-13 LAB — CBC WITH DIFFERENTIAL/PLATELET
Abs Immature Granulocytes: 0.01 10*3/uL (ref 0.00–0.07)
Basophils Absolute: 0 10*3/uL (ref 0.0–0.1)
Basophils Relative: 1 %
Eosinophils Absolute: 0.6 10*3/uL — ABNORMAL HIGH (ref 0.0–0.5)
Eosinophils Relative: 8 %
HCT: 41.6 % (ref 39.0–52.0)
Hemoglobin: 13.3 g/dL (ref 13.0–17.0)
Immature Granulocytes: 0 %
Lymphocytes Relative: 23 %
Lymphs Abs: 1.7 10*3/uL (ref 0.7–4.0)
MCH: 30.9 pg (ref 26.0–34.0)
MCHC: 32 g/dL (ref 30.0–36.0)
MCV: 96.7 fL (ref 80.0–100.0)
Monocytes Absolute: 0.4 10*3/uL (ref 0.1–1.0)
Monocytes Relative: 6 %
Neutro Abs: 4.6 10*3/uL (ref 1.7–7.7)
Neutrophils Relative %: 62 %
Platelets: 174 10*3/uL (ref 150–400)
RBC: 4.3 MIL/uL (ref 4.22–5.81)
RDW: 12.3 % (ref 11.5–15.5)
WBC: 7.3 10*3/uL (ref 4.0–10.5)
nRBC: 0 % (ref 0.0–0.2)

## 2021-08-13 LAB — COMPREHENSIVE METABOLIC PANEL
ALT: 12 U/L (ref 0–44)
AST: 23 U/L (ref 15–41)
Albumin: 3.7 g/dL (ref 3.5–5.0)
Alkaline Phosphatase: 105 U/L (ref 38–126)
Anion gap: 7 (ref 5–15)
BUN: 21 mg/dL (ref 8–23)
CO2: 26 mmol/L (ref 22–32)
Calcium: 8.6 mg/dL — ABNORMAL LOW (ref 8.9–10.3)
Chloride: 102 mmol/L (ref 98–111)
Creatinine, Ser: 1.42 mg/dL — ABNORMAL HIGH (ref 0.61–1.24)
GFR, Estimated: 46 mL/min — ABNORMAL LOW (ref >60)
Glucose, Bld: 153 mg/dL — ABNORMAL HIGH (ref 70–99)
Potassium: 3.9 mmol/L (ref 3.5–5.1)
Sodium: 135 mmol/L (ref 135–145)
Total Bilirubin: 0.4 mg/dL (ref 0.3–1.2)
Total Protein: 6.8 g/dL (ref 6.5–8.1)

## 2021-08-13 NOTE — Progress Notes (Signed)
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  He has no significant comorbidities other than hyperlipidemia and GERD.  He has a history of superficial skin cancer.  He presented to the ER with symptoms of right-sided flank pain and had a CT renal stone study which showed a 2.1 x 1.1 cm right lower lobe pulmonary nodule.  Also noted to have multiple new ill-defined lesions throughout the right lobe of the liver largest measuring 3.4 x 2.8 cm.  Spleen and pancreas was unremarkable.  Low-attenuation lesion in the right kidney 2.6 x 1.2 cm likely representing a cyst.  No intra-abdominal adenopathy.  Prostate gland is enlarged measuring 6.2 x 6.5 x 5.3 cm.  Patient has some baseline CKD and his creatinine is around 1.3.   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  NGS testing Showed following genomic variants: EPHA3 R136, NF 1R 440, PB RM1 K61, PBR M1 K702 fs, PTPRT H631, RAD54L Y335A.  PD-L1 60%.  Negative for BRAF.  MRI brain was negative for metastases.   Single agent keytruda started in June 2021.  Treatment on hold since December 2022 due to development of significant skin rash  Interval history-patient is doing well presently.  Skin rash has resolved.  Denies any new complaints at this time.  He is doing well for his age and  remains independent of his ADLs.  No recent falls  ECOG PS- 1 Pain scale- 0   Review of systems- Review of Systems  Constitutional:  Negative for chills, fever, malaise/fatigue and weight loss.  HENT:  Negative for congestion, ear discharge and nosebleeds.   Eyes:  Negative for blurred vision.  Respiratory:  Negative for cough, hemoptysis, sputum production, shortness of breath and wheezing.   Cardiovascular:  Negative for chest pain, palpitations, orthopnea and claudication.  Gastrointestinal:  Negative for abdominal pain, blood in stool, constipation, diarrhea, heartburn, melena, nausea and vomiting.  Genitourinary:  Negative for dysuria, flank pain, frequency, hematuria and urgency.  Musculoskeletal:  Negative for back pain, joint pain and myalgias.  Skin:  Negative for rash.  Neurological:  Negative for dizziness, tingling, focal weakness, seizures, weakness and headaches.  Endo/Heme/Allergies:  Does not bruise/bleed easily.  Psychiatric/Behavioral:  Negative for depression and suicidal ideas. The patient does not have insomnia.       No Known Allergies   Past Medical History:  Diagnosis Date   Melanoma ([place])      Past Surgical History:  Procedure Laterality Date   CATARACT EXTRACTION Bilateral 2000   HERNIA REPAIR     MELANOMA EXCISION     PORTA CATH INSERTION N/A 06/28/2019   Procedure: PORTA CATH INSERTION;  Surgeon: Algernon Huxley, MD;  Location: [facility];  Service: Cardiovascular;  Laterality: N/A;    Social History   Socioeconomic History   Marital status: Widowed    Spouse name: Not  on file   Number of children: Not on file   Years of education: Not on file   Highest education level: Not on file  Occupational History   Not on file  Tobacco Use   Smoking status: Never   Smokeless tobacco: Never  Vaping Use   Vaping Use: Never used  Substance and Sexual Activity   Alcohol use: Never   Drug use: Not Currently   Sexual activity: Not Currently   Other Topics Concern   Not on file  Social History Narrative   Lives at home by himself    Social Determinants of Health   Financial Resource Strain: Not on file  Food Insecurity: Not on file  Transportation Needs: Not on file  Physical Activity: Not on file  Stress: Not on file  Social Connections: Not on file  Intimate Partner Violence: Not on file    History reviewed. No pertinent family history.   Current Outpatient Medications:    acetaminophen (TYLENOL) 325 MG tablet, Take 650 mg by mouth every 6 (six) hours as needed. , Disp: , Rfl:    Loratadine 10 MG CAPS, Take 1 capsule by mouth daily as needed. , Disp: , Rfl:    triamcinolone cream (KENALOG) 0.5 %, Apply 1 application  topically 2 (two) times daily., Disp: 454 g, Rfl: 0   ondansetron (ZOFRAN) 8 MG tablet, Take 1 tablet (8 mg total) by mouth 2 (two) times daily as needed (Nausea or vomiting). (Patient not taking: Reported on 11/26/2019), Disp: 30 tablet, Rfl: 1   prochlorperazine (COMPAZINE) 10 MG tablet, Take 1 tablet (10 mg total) by mouth every 6 (six) hours as needed (Nausea or vomiting). (Patient not taking: Reported on 06/28/2019), Disp: 30 tablet, Rfl: 1   traMADol (ULTRAM) 50 MG tablet, TAKE 1 TABLET (50 MG TOTAL) BY MOUTH EVERY 6 (SIX) HOURS AS NEEDED. (Patient not taking: Reported on 05/12/2020), Disp: 60 tablet, Rfl: 0  Physical exam:  Vitals:   08/13/21 1305  BP: (!) 133/48  Pulse: (!) 46  Resp: 18  Temp: (!) 97.1 F (36.2 C)  SpO2: 97%  Weight: 164 lb 8 oz (74.6 kg)   Physical Exam Constitutional:      General: He is not in acute distress. Cardiovascular:     Rate and Rhythm: Normal rate and regular rhythm.     Heart sounds: Normal heart sounds.  Pulmonary:     Effort: Pulmonary effort is normal.     Breath sounds: Normal breath sounds.  Abdominal:     General: Bowel sounds are normal.     Palpations: Abdomen is soft.  Skin:    General: Skin is warm and dry.  Neurological:     Mental Status:  He is alert and oriented to person, place, and time.         Latest Ref Rng & Units 08/13/2021   12:40 PM  CMP  Glucose 70 - 99 mg/dL 153   BUN 8 - 23 mg/dL 21   Creatinine 0.61 - 1.24 mg/dL 1.42   Sodium 135 - 145 mmol/L 135   Potassium 3.5 - 5.1 mmol/L 3.9   Chloride 98 - 111 mmol/L 102   CO2 22 - 32 mmol/L 26   Calcium 8.9 - 10.3 mg/dL 8.6   Total Protein 6.5 - 8.1 g/dL 6.8   Total Bilirubin 0.3 - 1.2 mg/dL 0.4   Alkaline Phos 38 - 126 U/L 105   AST 15 - 41 U/L 23   ALT 0 - 44  U/L 12       Latest Ref Rng & Units 08/13/2021   12:40 PM  CBC  WBC 4.0 - 10.5 K/uL 7.3   Hemoglobin 13.0 - 17.0 g/dL 13.3   Hematocrit 39.0 - 52.0 % 41.6   Platelets 150 - 400 K/uL 174     No images are attached to the encounter.  MR Brain W Wo Contrast  Result Date: 08/10/2021 CLINICAL DATA:  Metastatic melanoma.  Follow-up. EXAM: MRI HEAD WITHOUT AND WITH CONTRAST TECHNIQUE: Multiplanar, multiecho pulse sequences of the brain and surrounding structures were obtained without and with intravenous contrast. CONTRAST:  7.65m GADAVIST GADOBUTROL 1 MMOL/ML IV SOLN COMPARISON:  06/16/2019 FINDINGS: Brain: Diffusion imaging does not show any acute or subacute infarction or other cause of restricted diffusion. Chronic small-vessel ischemic change affects the pons. 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 No focal cerebellar finding. Cerebral hemispheres show mild chronic small-vessel ischemic change of the white matter considering age. No cortical or large vessel territory infarction. No sign of primary or metastatic mass lesion, hydrocephalus or extra-axial collection. After contrast administration, no abnormal enhancement occurs. Vascular: Major vessels at the base of the brain show flow. Skull and upper cervical spine: Negative Sinuses/Orbits: Clear/normal Other: None IMPRESSION: No evidence of metastatic disease. Chronic small-vessel  ischemic change of the pons and cerebral hemispheric white matter, less than often seen at this age. 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 Electronically Signed   By: MNelson ChimesM.D.   On: 08/10/2021 17:34   CT CHEST ABDOMEN PELVIS WO CONTRAST  Result Date: 08/10/2021 CLINICAL DATA:  Restaging metastatic melanoma, * Tracking Code: BO *. EXAM: CT CHEST, ABDOMEN AND PELVIS WITHOUT CONTRAST TECHNIQUE: Multidetector CT imaging of the chest, abdomen and pelvis was performed following the standard protocol without IV contrast. RADIATION DOSE REDUCTION: This exam was performed according to the departmental dose-optimization program which includes automated exposure control, adjustment of the mA and/or kV according to patient size and/or use of iterative reconstruction technique. COMPARISON:  Multiple priors including most recent CT April 08, 2021 FINDINGS: CT CHEST FINDINGS Cardiovascular: Right chest Port-A-Cath with tip in the SVC just above the superior cavoatrial junction. Aortic atherosclerosis. Normal size heart. No significant pericardial effusion/thickening. Mediastinum/Nodes: No suspicious thyroid nodule. No pathologically enlarged mediastinal, hilar or axillary lymph nodes, noting limited sensitivity for the detection of hilar adenopathy on this noncontrast study. The esophagus is grossly unremarkable. Lungs/Pleura: Elevation of the right hemidiaphragm. No suspicious pulmonary nodules or masses. Similar biapical pleuroparenchymal scarring. No pleural effusion. No pneumothorax. Musculoskeletal: Multilevel thoracic vertebral body compression deformities appear unchanged again most notably at T12 and T9. CT ABDOMEN PELVIS FINDINGS Hepatobiliary: Similar atrophy of the right and medial left hepatic lobes. 16 mm cyst in the left hepatic lobe on image 51/2. Subtle 5 mm lesion in the right lobe of the liver seen on prior study is not  confidently identified on this noncontrast examination. Gallbladder is unremarkable. No biliary ductal dilation. Pancreas: No pancreatic ductal dilation or evidence of acute inflammation. Spleen: No splenomegaly. Adrenals/Urinary Tract: Bilateral adrenal glands are within normal limits. No hydronephrosis. Exophytic 16 mm right renal lesion measures fluid density consistent with a cyst and considered benign requiring no independent imaging follow-up. Irregularity along the anterior bladder wall is again seen and likely related to a chronic outflow impedance. Stomach/Bowel: No radiopaque enteric contrast material was administered.  Stomach is nondistended limiting evaluation. No pathologic dilation of small or large bowel. No evidence of acute bowel inflammation. Sigmoid colonic diverticulosis without findings of acute diverticulitis. Vascular/Lymphatic: Aortic atherosclerosis. No pathologically enlarged abdominal or pelvic lymph nodes. Reproductive: Moderate prostatomegaly. Other: No significant abdominopelvic free fluid. No discrete peritoneal or omental nodularity. Musculoskeletal: Chronic bilateral L5 pars defects with grade 1 L5 on S1 anterolisthesis. Diffuse demineralization of bone. Similar appearance of the multilevel compression deformities. No aggressive lytic or blastic lesion of bone. IMPRESSION: 1. No evidence of new or progressive metastatic disease in the chest, abdomen or pelvis on this non contrast enhanced CT. 2. Subtle lesion in the right hepatic lobe seen on prior examinations is not confidently identified on this noncontrast examination. 3. Prostatomegaly with sequela of chronic outflow impedance along the anterior urinary bladder wall. 4.  Aortic Atherosclerosis (ICD10-I70.0). Electronically Signed   By: Dahlia Bailiff M.D.   On: 08/10/2021 10:36     Assessment and plan- Patient is a 88 y.o. male with metastatic melanoma BRAF negative with lung and liver metastases who was on maintenance  Keytruda currently off treatment here for routine follow-up  I have reviewed CT chestAbdomen pelvis images independently and discussed findings with the patient which does not show any evidence of recurrent and progressive disease.  Pulmonary nodules have resolved and so have the liver mets.  There is no active disease noted on his CT scan.  MRI brain was negative for malignancy as well.  And therefore inclined to observe him off Keytruda at this time given that he had significant skin rash with [facility] and also received immunotherapy for 1.5 years before we held it.  If he were to develop any progressive disease in the future I will rechallenge him with Keytruda at that time.  I will see him back in 5 months with repeat scans  Patient has baseline CKD which is overall stable   Visit Diagnosis 1. Metastatic malignant melanoma ([place])      Dr. Randa Evens, MD, MPH [facility] at [facility] [phone] 08/13/2021 2:05 PM

## 2021-09-25 ENCOUNTER — Inpatient Hospital Stay: Payer: Medicare Other

## 2021-10-22 ENCOUNTER — Inpatient Hospital Stay: Payer: Medicare Other | Attending: Oncology

## 2021-11-09 ENCOUNTER — Encounter (INDEPENDENT_AMBULATORY_CARE_PROVIDER_SITE_OTHER): Payer: Self-pay

## 2021-11-18 ENCOUNTER — Emergency Department
Admission: EM | Admit: 2021-11-18 | Discharge: 2021-11-18 | Disposition: A | Discharge status: Home / Self Care | Payer: Medicare Other | Attending: Emergency Medicine | Admitting: Emergency Medicine

## 2021-11-18 ENCOUNTER — Encounter: Payer: Self-pay | Admitting: Emergency Medicine

## 2021-11-18 ENCOUNTER — Other Ambulatory Visit: Payer: Self-pay

## 2021-11-18 DIAGNOSIS — U071 COVID-19: Secondary | ICD-10-CM | POA: Diagnosis not present

## 2021-11-18 DIAGNOSIS — Z85820 Personal history of malignant melanoma of skin: Secondary | ICD-10-CM | POA: Diagnosis not present

## 2021-11-18 DIAGNOSIS — J029 Acute pharyngitis, unspecified: Secondary | ICD-10-CM | POA: Diagnosis present

## 2021-11-18 LAB — RESP PANEL BY RT-PCR (FLU A&B, COVID) ARPGX2
Influenza A by PCR: NEGATIVE
Influenza B by PCR: NEGATIVE
SARS Coronavirus 2 by RT PCR: POSITIVE — AB

## 2021-11-18 LAB — GROUP A STREP BY PCR: Group A Strep by PCR: NOT DETECTED

## 2021-11-18 MED ORDER — IBUPROFEN 400 MG PO TABS
400.0000 mg | ORAL_TABLET | Freq: Once | ORAL | Status: AC
Start: 2021-11-18 — End: 2021-11-18
  Administered 2021-11-18: 400 mg via ORAL
  Filled 2021-11-18: qty 1

## 2021-11-18 NOTE — Discharge Instructions (Signed)
Your strep test was negative.  You likely have a virus causing your sore throat and runny nose.  You can take Motrin for the pain.  You can also use something like DayQuil or NyQuil for congestion and throat soreness.

## 2021-11-18 NOTE — ED Triage Notes (Signed)
Patient to ED via Pov for a sore throat. Patient states it has been hurt for the past few days. Ambulatory to triage.

## 2021-11-18 NOTE — ED Provider Notes (Signed)
[facility] Provider Note    Event Date/Time   First MD Initiated Contact with Patient 11/18/21 1454     (approximate)   History   Sore Throat   HPI  Omar Schroeder is a 88 y.o. male  with pmh HLD, GERD, metastatic melanoma who p/w sore throat. Symptoms started several days ago. He endorses a scratchy, painful throat.  Pain is midline not worse on one side.  Thinks his voice is somewhat scratcher than normal.  He is still eating and drinking has had runny nose and cough no shortness of breath or chest pain no fevers or chills.  No nausea vomiting or diarrhea.  No sick contacts.  Has been taking Motrin for it which helps the pain.     Past Medical History:  Diagnosis Date  . Melanoma [facility])     Patient Active Problem List   Diagnosis Date Noted  . Metastatic melanoma ([place]) 06/18/2019  . Goals of care, counseling/discussion 06/01/2019  . Gastro-esophageal reflux disease without esophagitis 11/21/2014  . Hyperlipidemia 11/21/2014  . History of nonmelanoma skin cancer 09/30/2014     Physical Exam  Triage Vital Signs: ED Triage Vitals  Enc Vitals Group     BP 11/18/21 1427 118/65     Pulse Rate 11/18/21 1427 76     Resp 11/18/21 1427 18     Temp 11/18/21 1427 97.7 F (36.5 C)     Temp Source 11/18/21 1427 Oral     SpO2 11/18/21 1427 97 %     Weight 11/18/21 1425 161 lb (73 kg)     Height 11/18/21 1425 6' (1.829 m)     Head Circumference --      Peak Flow --      Pain Score 11/18/21 1425 4     Pain Loc --      Pain Edu? --      Excl. in [place]? --     Most recent vital signs: Vitals:   11/18/21 1427  BP: 118/65  Pulse: 76  Resp: 18  Temp: 97.7 F (36.5 C)  SpO2: 97%     General: Awake, no distress.  CV:  Good peripheral perfusion.  Resp:  Normal effort.  Abd:  No distention.  Neuro:             Awake, Alert, Oriented x 3  Other:  Posterior oropharynx with mild erythema but uvula is midline no exudate or tonsillar swelling No  stridor or hoarseness noted [place] anterior cervical lymphadenopathy bilaterally   ED Results / Procedures / Treatments  Labs (all labs ordered are listed, but only abnormal results are displayed) Labs Reviewed  GROUP A STREP BY PCR  RESP PANEL BY RT-PCR (FLU A&B, COVID) [id number]     EKG     RADIOLOGY    PROCEDURES:  Critical Care performed: No  Procedures   MEDICATIONS ORDERED IN ED: Medications  ibuprofen (ADVIL) tablet 400 mg (has no administration in time range)     IMPRESSION / MDM / ASSESSMENT AND PLAN / ED COURSE  I reviewed the triage vital signs and the nursing notes.                              Patient's presentation is most consistent with acute, uncomplicated illness.  Differential diagnosis includes, but is not limited to, viral pharyngitis, bacterial pharyngitis, viral URI, postnasal drip, GERD  The patient is a 88 year old male  presenting with several days of sore throat cough and runny nose.  He has not had fevers he is not short of breath.  Still tolerating p.o.  His vitals are reassuring he is afebrile.  Patient looks quite well for his age.  He is ambulatory alert and oriented.  He has no stridor and no respiratory distress.  Does have some erythema the posterior oropharynx but there is no exudate uvula is midline's findings do not suggest PTA.  He has no neck pain to suggest retropharyngeal abscess.  Constellation of symptoms is most consistent with a viral URI and viral pharyngitis.  Strep test was sent from triage.  Will send COVID and influenza test.  Recommended Tylenol Motrin and discussed return precautions.   Clinical Course as of 11/18/21 1509  Wed Nov 18, 2021  1508 Group A Strep by PCR: NOT DETECTED [KM]    Clinical Course User Index [KM] Rada Hay, MD     FINAL CLINICAL IMPRESSION(S) / ED DIAGNOSES   Final diagnoses:  Viral pharyngitis     Rx / DC Orders   ED Discharge Orders     None        Note:  This  document was prepared using Dragon voice recognition software and may include unintentional dictation errors.   Rada Hay, MD 11/18/21 [phone]

## 2021-11-18 NOTE — ED Notes (Signed)
Pt to ED via POV for sore throat for the past few days.

## 2021-11-20 ENCOUNTER — Inpatient Hospital Stay: Payer: Medicare Other

## 2021-12-04 IMAGING — CT CT RENAL STONE PROTOCOL
2 of 6 series · 16 of 46 positions shown, 18 images · non-contrast
Comparison: CT 12/26/2018, 11/07/2010

CLINICAL DATA: RIGHT flank pain.

EXAM:
CT ABDOMEN AND PELVIS WITHOUT CONTRAST
TECHNIQUE: Multidetector CT imaging of the abdomen and pelvis was performed
following the standard protocol without IV contrast.

[Series 2: stone full standard · axial · 0.81mm/px · z∈[-712,-242]mm · 13 of 106 slices shown, 15 images]
[im 6/106  soft-tissue]
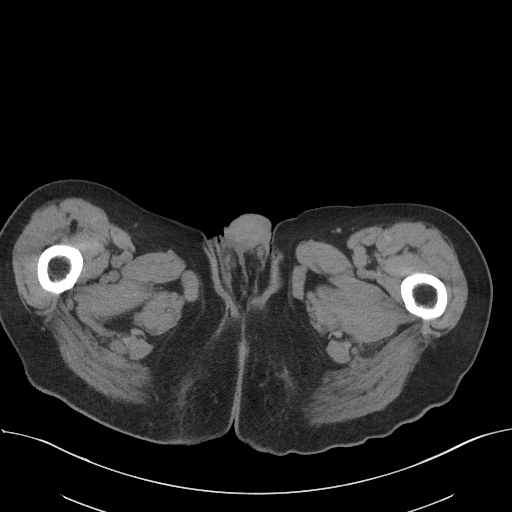
[im 6/106  bone]
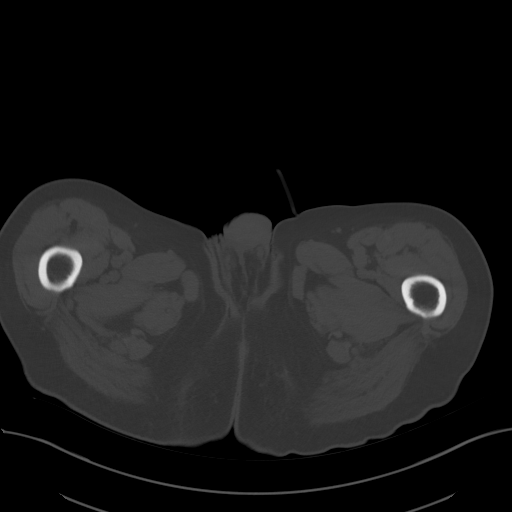
[im 12/106  soft-tissue]
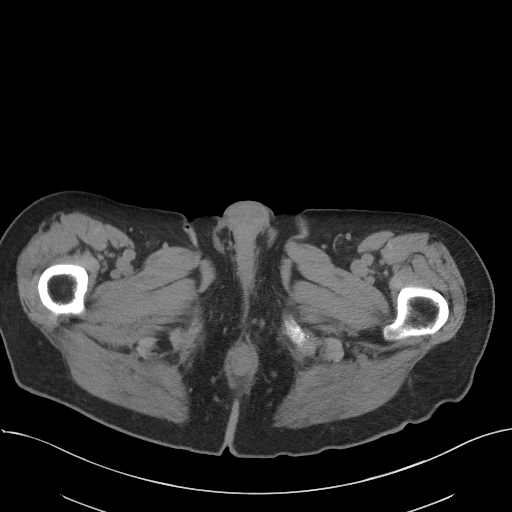
[im 24/106  soft-tissue]
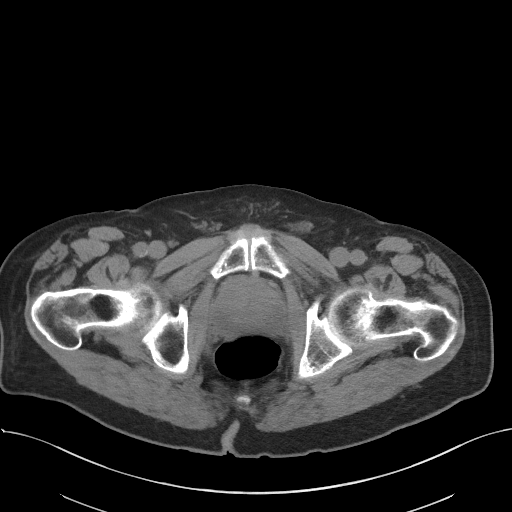
[im 30/106  soft-tissue]
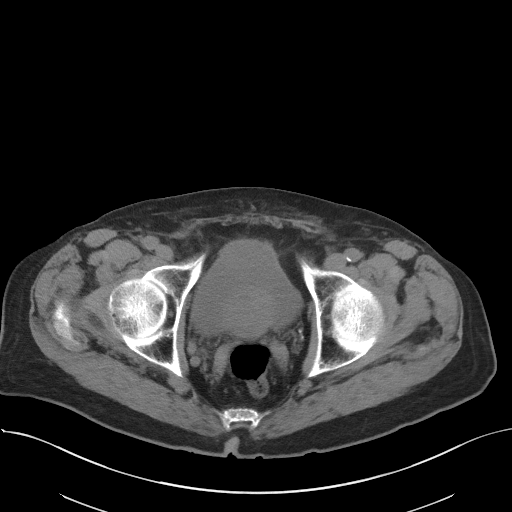
[im 36/106  soft-tissue]
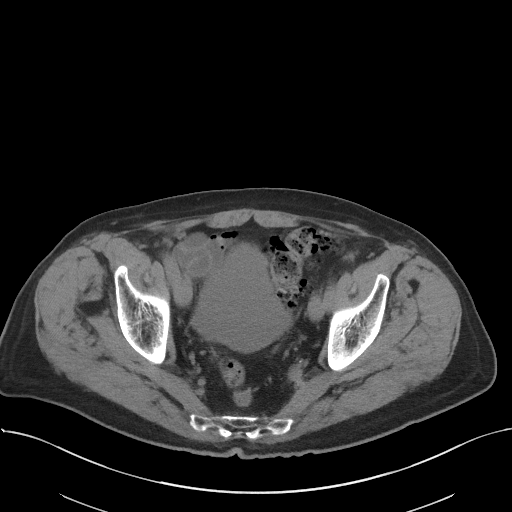
[im 47/106  soft-tissue]
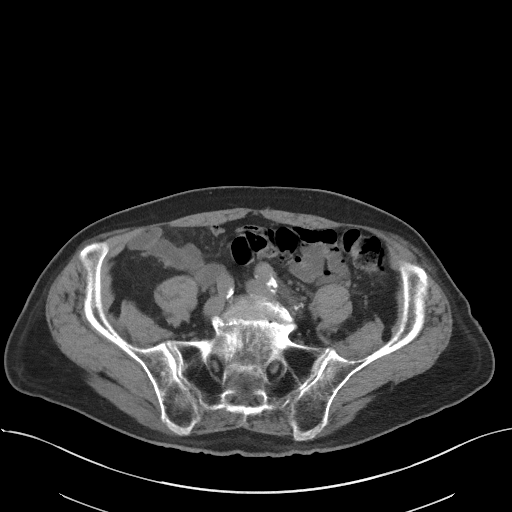
[im 53/106  soft-tissue]
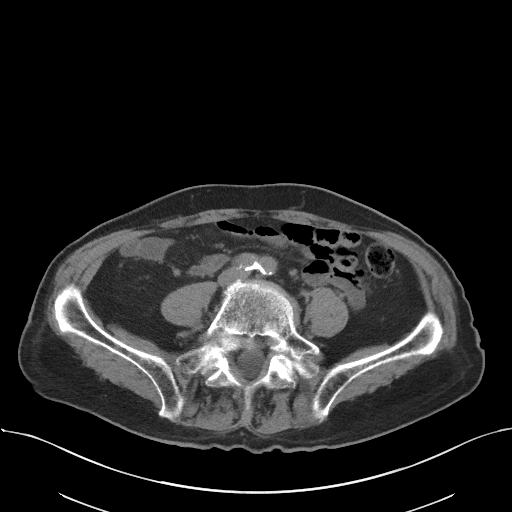
[im 59/106  soft-tissue]
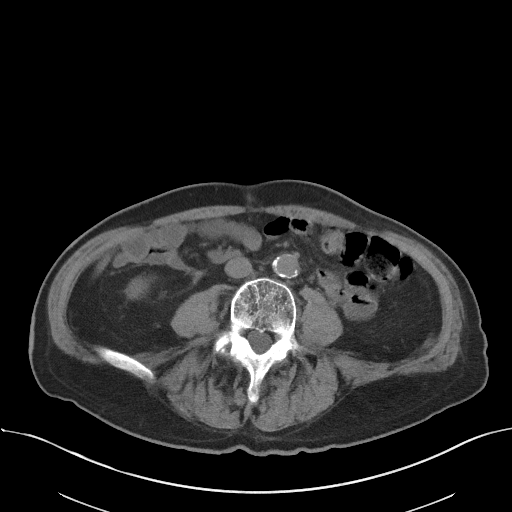
[im 71/106  soft-tissue]
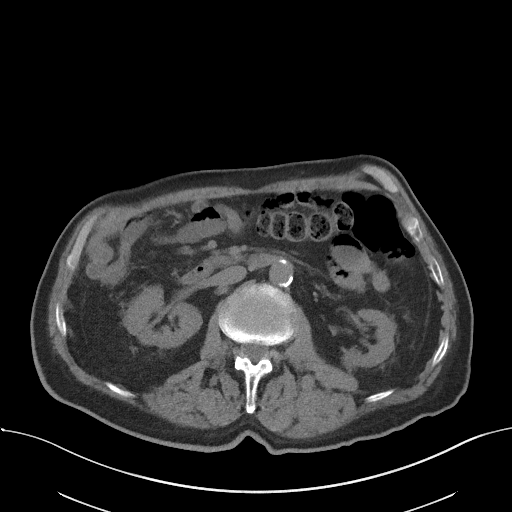
[im 71/106  bone]
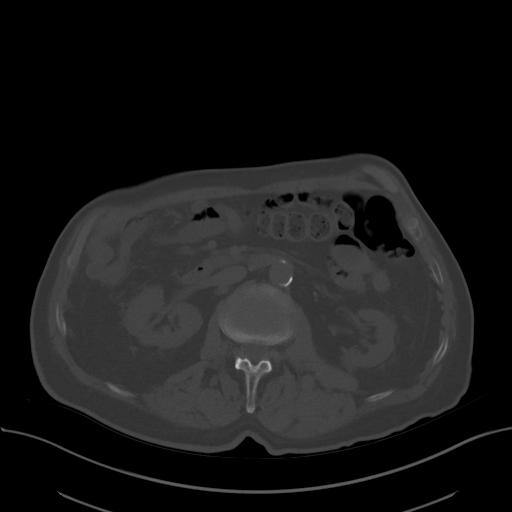
[im 76/106  soft-tissue]
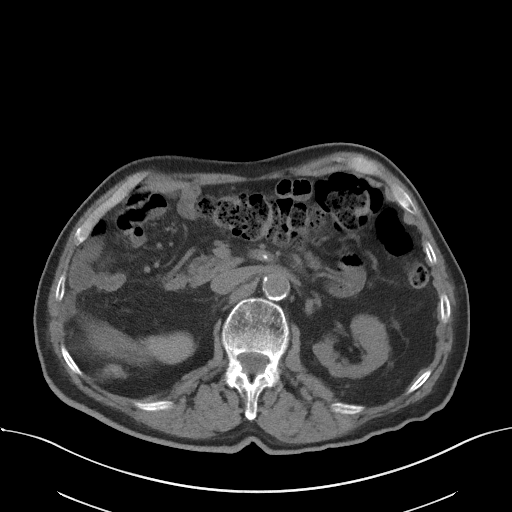
[im 82/106  soft-tissue]
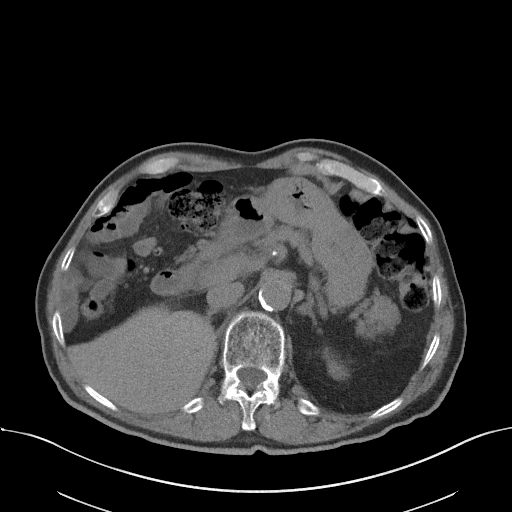
[im 94/106  soft-tissue]
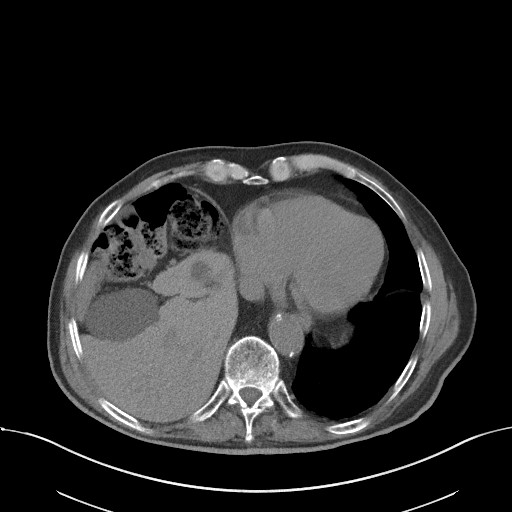
[im 100/106  soft-tissue]
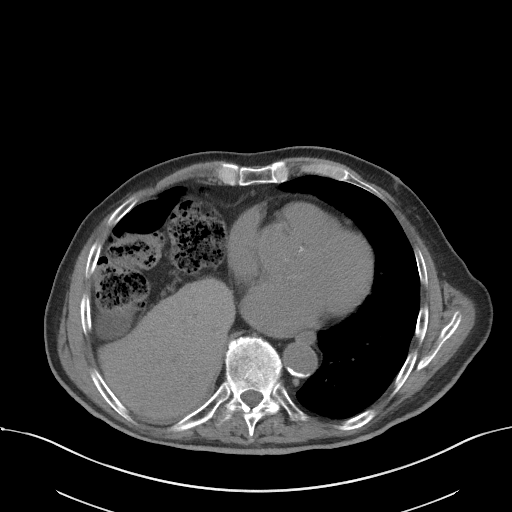

[Series 6: coronal · coronal · 0.80mm/px · 3 of 123 slices shown]
[im 41/123  soft-tissue]
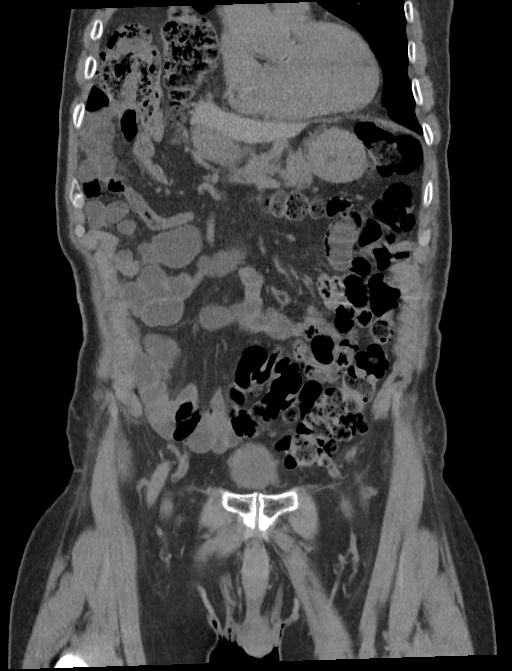
[im 55/123  soft-tissue]
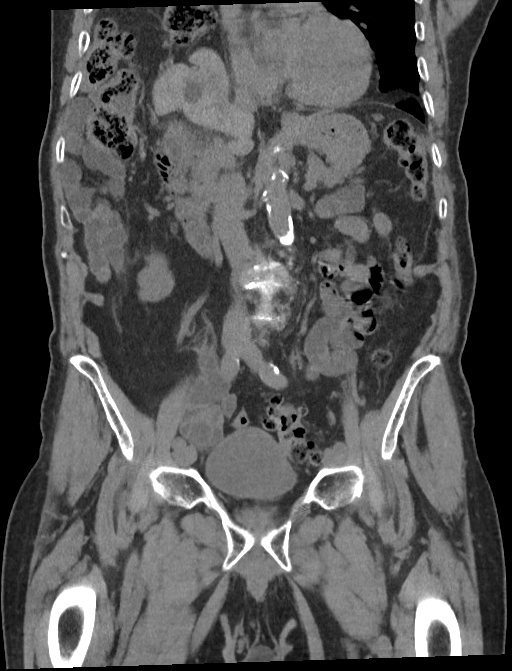
[im 68/123  soft-tissue]
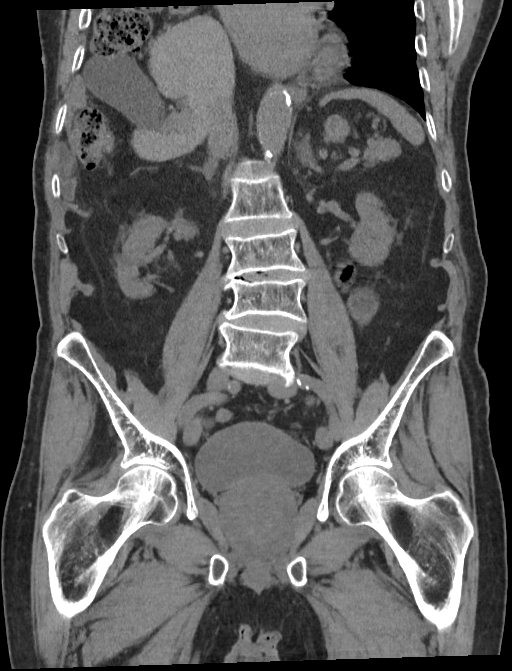

[16 of 46 positions shown; findings below may reference images not displayed]

FINDINGS: Lower chest: Chronic elevation of the RIGHT hemidiaphragm. Mild
atelectasis at the RIGHT lung base associated with the diaphragm
elevation.

Hepatobiliary: hepatic lesion consistent benign cyst. The colon
hepatic flexure extends anterior to the liver beneath the elevated
diaphragm. No biliary duct dilatation

Pancreas: Pancreas is normal. No ductal dilatation. No pancreatic
inflammation.

Spleen: Normal spleen

Adrenals/urinary tract: Adrenal glands normal. Low-density cortical
lesion of the RIGHT kidney is likely benign cysts. No
ureterolithiasis or obstructive uropathy.

Stomach/Bowel: Stomach, small bowel normal. The cecum is at the
level of the gallbladder. Appendix normal. Hepatic flexure extends
beneath the RIGHT hemidiaphragm. The transverse colon descending
colon normal. Diverticula of the sigmoid colon. No acute
inflammation.

Vascular/Lymphatic: Abdominal aorta is normal caliber with
atherosclerotic calcification. There is no retroperitoneal or
periportal lymphadenopathy. No pelvic lymphadenopathy.

Reproductive: Prostate gland is markedly enlarged to 16 mm.

Other: No free fluid.

Musculoskeletal: Bilateral pars defects at L5. Grade 1
anterolisthesis of L5 on S1.
IMPRESSION: 1. Chronic elevation of the RIGHT hemidiaphragm. The hepatic flexure
of the colon extends anterior to the liver. The cecum is at the
level of the gallbladder. Chronic findings.
2. No nephrolithiasis, ureterolithiasis, or obstructive uropathy.
3. Sigmoid colon diverticulosis without evidence acute
diverticulitis.
4.  Aortic Atherosclerosis ([id number]).
5. Prostate enlargement

## 2021-12-28 ENCOUNTER — Ambulatory Visit: Payer: Medicare Other | Admitting: Dermatology

## 2022-01-15 ENCOUNTER — Ambulatory Visit
Admission: RE | Admit: 2022-01-15 | Discharge: 2022-01-15 | Disposition: A | Discharge status: Home / Self Care | Payer: Medicare Other | Source: Ambulatory Visit | Attending: Oncology | Admitting: Oncology

## 2022-01-15 ENCOUNTER — Inpatient Hospital Stay: Payer: Medicare Other

## 2022-01-15 ENCOUNTER — Other Ambulatory Visit: Payer: Self-pay | Admitting: *Deleted

## 2022-01-15 DIAGNOSIS — C439 Malignant melanoma of skin, unspecified: Secondary | ICD-10-CM

## 2022-01-18 ENCOUNTER — Encounter: Payer: Self-pay | Admitting: Oncology

## 2022-01-18 ENCOUNTER — Inpatient Hospital Stay: Payer: Medicare Other

## 2022-01-18 ENCOUNTER — Inpatient Hospital Stay: Payer: Medicare Other | Attending: Oncology | Admitting: Oncology

## 2022-01-18 VITALS — BP 142/67 | HR 36 | Temp 95.0°F | Resp 17 | Wt 168.0 lb

## 2022-01-18 DIAGNOSIS — C439 Malignant melanoma of skin, unspecified: Secondary | ICD-10-CM

## 2022-01-18 DIAGNOSIS — C7800 Secondary malignant neoplasm of unspecified lung: Secondary | ICD-10-CM | POA: Insufficient documentation

## 2022-01-18 DIAGNOSIS — N400 Enlarged prostate without lower urinary tract symptoms: Secondary | ICD-10-CM | POA: Diagnosis not present

## 2022-01-18 DIAGNOSIS — Z7189 Other specified counseling: Secondary | ICD-10-CM

## 2022-01-18 DIAGNOSIS — Z95828 Presence of other vascular implants and grafts: Secondary | ICD-10-CM

## 2022-01-18 DIAGNOSIS — C787 Secondary malignant neoplasm of liver and intrahepatic bile duct: Secondary | ICD-10-CM | POA: Insufficient documentation

## 2022-01-18 LAB — CBC WITH DIFFERENTIAL/PLATELET
Abs Immature Granulocytes: 0.02 10*3/uL (ref 0.00–0.07)
Basophils Absolute: 0 10*3/uL (ref 0.0–0.1)
Basophils Relative: 0 %
Eosinophils Absolute: 0.3 10*3/uL (ref 0.0–0.5)
Eosinophils Relative: 4 %
HCT: 38.6 % — ABNORMAL LOW (ref 39.0–52.0)
Hemoglobin: 12.6 g/dL — ABNORMAL LOW (ref 13.0–17.0)
Immature Granulocytes: 0 %
Lymphocytes Relative: 30 %
Lymphs Abs: 2.1 10*3/uL (ref 0.7–4.0)
MCH: 30.9 pg (ref 26.0–34.0)
MCHC: 32.6 g/dL (ref 30.0–36.0)
MCV: 94.6 fL (ref 80.0–100.0)
Monocytes Absolute: 0.6 10*3/uL (ref 0.1–1.0)
Monocytes Relative: 9 %
Neutro Abs: 4 10*3/uL (ref 1.7–7.7)
Neutrophils Relative %: 57 %
Platelets: 180 10*3/uL (ref 150–400)
RBC: 4.08 MIL/uL — ABNORMAL LOW (ref 4.22–5.81)
RDW: 13.3 % (ref 11.5–15.5)
WBC: 7 10*3/uL (ref 4.0–10.5)
nRBC: 0 % (ref 0.0–0.2)

## 2022-01-18 LAB — COMPREHENSIVE METABOLIC PANEL
ALT: 17 U/L (ref 0–44)
AST: 25 U/L (ref 15–41)
Albumin: 3.7 g/dL (ref 3.5–5.0)
Alkaline Phosphatase: 106 U/L (ref 38–126)
Anion gap: 4 — ABNORMAL LOW (ref 5–15)
BUN: 26 mg/dL — ABNORMAL HIGH (ref 8–23)
CO2: 25 mmol/L (ref 22–32)
Calcium: 8.3 mg/dL — ABNORMAL LOW (ref 8.9–10.3)
Chloride: 107 mmol/L (ref 98–111)
Creatinine, Ser: 1.24 mg/dL (ref 0.61–1.24)
GFR, Estimated: 54 mL/min — ABNORMAL LOW (ref >60)
Glucose, Bld: 78 mg/dL (ref 70–99)
Potassium: 4.4 mmol/L (ref 3.5–5.1)
Sodium: 136 mmol/L (ref 135–145)
Total Bilirubin: 0.6 mg/dL (ref 0.3–1.2)
Total Protein: 6.3 g/dL — ABNORMAL LOW (ref 6.5–8.1)

## 2022-01-18 MED ORDER — SODIUM CHLORIDE 0.9% FLUSH
10.0000 mL | Freq: Once | INTRAVENOUS | Status: AC
  Administered 2022-01-18: 10 mL via INTRAVENOUS
  Filled 2022-01-18: qty 10

## 2022-01-18 MED ORDER — HEPARIN SOD (PORK) LOCK FLUSH 100 UNIT/ML IV SOLN
500.0000 [IU] | Freq: Once | INTRAVENOUS | Status: AC
  Administered 2022-01-18: 500 [IU] via INTRAVENOUS
  Filled 2022-01-18: qty 5

## 2022-01-18 NOTE — Progress Notes (Signed)
Patient here for oncology follow-up appointment, expresses no complaints or concerns at this time.    

## 2022-01-18 NOTE — Progress Notes (Signed)
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  He has no significant comorbidities other than hyperlipidemia and GERD.  He has a history of superficial skin cancer.  He presented to the ER with symptoms of right-sided flank pain and had a CT renal stone study which showed a 2.1 x 1.1 cm right lower lobe pulmonary nodule.  Also noted to have multiple new ill-defined lesions throughout the right lobe of the liver largest measuring 3.4 x 2.8 cm.  Spleen and pancreas was unremarkable.  Low-attenuation lesion in the right kidney 2.6 x 1.2 cm likely representing a cyst.  No intra-abdominal adenopathy.  Prostate gland is enlarged measuring 6.2 x 6.5 x 5.3 cm.  Patient has some baseline CKD and his creatinine is around 1.3.   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  NGS testing Showed following genomic variants: EPHA3 R136, NF 1R 440, PB RM1 K61, PBR M1 K702 fs, PTPRT E174, RAD54L Y335A.  PD-L1 60%.  Negative for BRAF.  MRI brain was negative for metastases.   Single agent keytruda started in June 2021.  Treatment on hold since December 2022 due to development of significant skin rash  Interval history-patient is doing well for his age.  Reports that his appetite and weight are normal.  Denies any skin rash.  Denies  any headache or changes in his vision.  ECOG PS- 1 Pain scale- 0   Review of systems- Review of Systems  Constitutional:  Negative for chills, fever, malaise/fatigue and weight loss.  HENT:  Negative for congestion, ear discharge and nosebleeds.   Eyes:  Negative for blurred vision.  Respiratory:  Negative for cough, hemoptysis, sputum production, shortness of breath and wheezing.   Cardiovascular:  Negative for chest pain, palpitations, orthopnea and claudication.  Gastrointestinal:  Negative for abdominal pain, blood in stool, constipation, diarrhea, heartburn, melena, nausea and vomiting.  Genitourinary:  Negative for dysuria, flank pain, frequency, hematuria and urgency.  Musculoskeletal:  Negative for back pain, joint pain and myalgias.  Skin:  Negative for rash.  Neurological:  Negative for dizziness, tingling, focal weakness, seizures, weakness and headaches.  Endo/Heme/Allergies:  Does not bruise/bleed easily.  Psychiatric/Behavioral:  Negative for depression and suicidal ideas. The patient does not have insomnia.       No Known Allergies   Past Medical History:  Diagnosis Date   Melanoma ([place])      Past Surgical History:  Procedure Laterality Date   CATARACT EXTRACTION Bilateral 2000   HERNIA REPAIR     MELANOMA EXCISION     PORTA CATH INSERTION N/A 06/28/2019   Procedure: PORTA CATH INSERTION;  Surgeon: Algernon Huxley, MD;  Location: [facility];  Service: Cardiovascular;  Laterality: N/A;    Social History   Socioeconomic History   Marital status: Widowed    Spouse name: Not on file  Number of children: Not on file   Years of education: Not on file   Highest education level: Not on file  Occupational History   Not on file  Tobacco Use   Smoking status: Never   Smokeless tobacco: Never  Vaping Use   Vaping Use: Never used  Substance and Sexual Activity   Alcohol use: Never   Drug use: Not Currently   Sexual activity: Not Currently  Other  Topics Concern   Not on file  Social History Narrative   Lives at home by himself    Social Determinants of Health   Financial Resource Strain: Not on file  Food Insecurity: Not on file  Transportation Needs: Not on file  Physical Activity: Not on file  Stress: Not on file  Social Connections: Not on file  Intimate Partner Violence: Not on file    History reviewed. No pertinent family history.   Current Outpatient Medications:    acetaminophen (TYLENOL) 325 MG tablet, Take 650 mg by mouth every 6 (six) hours as needed. , Disp: , Rfl:    Loratadine 10 MG CAPS, Take 1 capsule by mouth daily as needed. , Disp: , Rfl:    ondansetron (ZOFRAN) 8 MG tablet, Take 1 tablet (8 mg total) by mouth 2 (two) times daily as needed (Nausea or vomiting). (Patient not taking: Reported on 11/26/2019), Disp: 30 tablet, Rfl: 1   prochlorperazine (COMPAZINE) 10 MG tablet, Take 1 tablet (10 mg total) by mouth every 6 (six) hours as needed (Nausea or vomiting). (Patient not taking: Reported on 06/28/2019), Disp: 30 tablet, Rfl: 1   traMADol (ULTRAM) 50 MG tablet, TAKE 1 TABLET (50 MG TOTAL) BY MOUTH EVERY 6 (SIX) HOURS AS NEEDED. (Patient not taking: Reported on 05/12/2020), Disp: 60 tablet, Rfl: 0   triamcinolone cream (KENALOG) 0.5 %, Apply 1 application  topically 2 (two) times daily. (Patient not taking: Reported on 01/18/2022), Disp: 454 g, Rfl: 0  Physical exam:  Vitals:   01/18/22 1058  BP: (!) 142/67  Pulse: (!) 36  Resp: 17  Temp: (!) 95 F (35 C)  TempSrc: Tympanic  SpO2: 98%  Weight: 168 lb (76.2 kg)   Physical Exam Cardiovascular:     Rate and Rhythm: Normal rate and regular rhythm.     Heart sounds: Normal heart sounds.  Pulmonary:     Effort: Pulmonary effort is normal.     Breath sounds: Normal breath sounds.  Abdominal:     General: Bowel sounds are normal.     Palpations: Abdomen is soft.  Lymphadenopathy:     Comments: No palpable cervical, supraclavicular, axillary or inguinal  adenopathy    Skin:    General: Skin is warm and dry.  Neurological:     Mental Status: He is alert and oriented to person, place, and time.         Latest Ref Rng & Units 01/18/2022   10:37 AM  CMP  Glucose 70 - 99 mg/dL 78   BUN 8 - 23 mg/dL 26   Creatinine 0.61 - 1.24 mg/dL 1.24   Sodium 135 - 145 mmol/L 136   Potassium 3.5 - 5.1 mmol/L 4.4   Chloride 98 - 111 mmol/L 107   CO2 22 - 32 mmol/L 25   Calcium 8.9 - 10.3 mg/dL 8.3   Total Protein 6.5 - 8.1 g/dL 6.3   Total Bilirubin 0.3 - 1.2 mg/dL 0.6   Alkaline Phos 38 - 126 U/L 106   AST 15 - 41 U/L  25   ALT 0 - 44 U/L 17       Latest Ref Rng & Units 01/18/2022   10:37 AM  CBC  WBC 4.0 - 10.5 K/uL 7.0   Hemoglobin 13.0 - 17.0 g/dL 12.6   Hematocrit 39.0 - 52.0 % 38.6   Platelets 150 - 400 K/uL 180     No images are attached to the encounter.  CT CHEST ABDOMEN PELVIS WO CONTRAST  Result Date: 01/15/2022 CLINICAL DATA:  Restaging metastatic melanoma. * Tracking Code: BO * EXAM: CT CHEST, ABDOMEN AND PELVIS WITHOUT CONTRAST TECHNIQUE: Multidetector CT imaging of the chest, abdomen and pelvis was performed following the standard protocol without IV contrast. RADIATION DOSE REDUCTION: This exam was performed according to the departmental dose-optimization program which includes automated exposure control, adjustment of the mA and/or kV according to patient size and/or use of iterative reconstruction technique. COMPARISON:  Multiple priors including most recent CT chest abdomen and pelvis dated August 10, 2021 FINDINGS: CT CHEST FINDINGS Cardiovascular: Aortic atherosclerosis. Right chest Port-A-Cath with tip at the superior cavoatrial junction. Coronary artery calcifications. Calcifications of the aortic valve. Normal size heart. No significant pericardial effusion/thickening. Mediastinum/Nodes: No supraclavicular adenopathy. No suspicious thyroid nodule. No pathologically enlarged mediastinal, hilar or axillary lymph nodes, noting  limited sensitivity for the detection of hilar adenopathy on this noncontrast study. The esophagus is grossly unremarkable. Lungs/Pleura: Unchanged elevation of the right hemidiaphragm. Similar biapical pleuroparenchymal scarring. Similar scattered atelectasis/scarring. No suspicious pulmonary nodules or masses. Musculoskeletal: Multilevel thoracic vertebral body compression deformities appear unchanged again most notably at T9 and T12. No aggressive lytic or blastic lesion of bone. CT ABDOMEN PELVIS FINDINGS Hepatobiliary: Similar atrophy of the right and medial left hepatic lobes. 19 mm cyst in the left hepatic lobe on image 50/2. Stable soft tissue nodularity along linear bands of subdiaphragmatic scarring/soft tissue measuring 14 x 14 mm on image 67/2 present dating back to at least December 31, 2019 and unchanged in the interval. Gallbladder surgically absent.  No biliary ductal dilation. Pancreas: No pancreatic ductal dilation or evidence of acute inflammation. Spleen: No splenomegaly or focal splenic lesion. Adrenals/Urinary Tract: Bilateral adrenal glands appear normal. No hydronephrosis. Exophytic fluid density right renal cyst is considered benign and requires no independent imaging follow-up. Similar irregularity along the anterior bladder wall for instance on image 105/2 possibly reflecting sequela of chronic outflow impedance. Stomach/Bowel: No radiopaque enteric contrast material was administered. Stomach is unremarkable for degree of distension. No pathologic dilation of small or large bowel. Moderate volume of formed stool throughout the colon suggestive of constipation. Sigmoid colonic diverticulosis without findings of acute diverticulitis. Vascular/Lymphatic: Aortic atherosclerosis. No pathologically enlarged abdominal or pelvic lymph nodes. Increased size of a prominent right inguinal lymph node which now measures 15 mm in short axis on image 112/2 previously 12 mm. Reproductive: Prostatomegaly.  Other: Bilateral adrenal glands appear normal. Musculoskeletal: No aggressive lytic or blastic lesion of bone. Chronic bilateral L5 pars defects with grade 1 L5 on S1 anterolisthesis. Similar appearance of the multilevel compression deformities. IMPRESSION: 1. Increased size of a minimally enlarged right inguinal lymph node which now measures 15 mm in short axis, previously 12 mm. While this may be reactive, metastatic disease is not excluded. Consider further evaluation with dedicated ultrasound and possible FNA. 2. Otherwise, stable examination without new or progressive findings in the chest, abdomen or pelvis. 3. Prostatomegaly with similar irregularity along the anterior bladder wall, again possibly reflecting sequela of chronic outflow impedance. However if not previously performed  consider urology referral for possible further evaluation with cystoscopy. 4. Moderate volume of formed stool throughout the colon suggestive of constipation. 5. Sigmoid colonic diverticulosis without findings of acute diverticulitis. 6.  Aortic Atherosclerosis (ICD10-I70.0). Electronically Signed   By: Dahlia Bailiff M.D.   On: 01/15/2022 16:36     Assessment and plan- Patient is a 89 y.o. male with metastatic melanoma BRAF negative with lung and liver metastases who was on maintenance Keytruda currently off treatment .  He is here for routine follow-up  I have reviewed CT chest abdomen pelvis images independently and discussed findings with the patient which does not show any evidence of recurrent or progressive disease.  He was noted to have a right inguinal lymph node measuring 15 mm which was 12 mm on his prior scan in July 2023.  Clinically he does not have any significant palpable adenopathy.  I will plan to repeat CT scans again in 4 months and see him thereafter.  He has been off [place] for a little over a year now and is doing well.  I plan to continue to hold off treatment unless there is evidence of disease  progression.   Visit Diagnosis 1. Metastatic malignant melanoma ([place])   2. Goals of care, counseling/discussion      Dr. Randa Evens, MD, MPH [facility] [phone] 01/18/2022 3:17 PM

## 2022-03-12 ENCOUNTER — Inpatient Hospital Stay: Payer: Medicare Other | Attending: Oncology

## 2022-03-12 DIAGNOSIS — Z95828 Presence of other vascular implants and grafts: Secondary | ICD-10-CM

## 2022-03-12 DIAGNOSIS — C439 Malignant melanoma of skin, unspecified: Secondary | ICD-10-CM | POA: Insufficient documentation

## 2022-03-12 DIAGNOSIS — Z452 Encounter for adjustment and management of vascular access device: Secondary | ICD-10-CM | POA: Insufficient documentation

## 2022-03-12 MED ORDER — HEPARIN SOD (PORK) LOCK FLUSH 100 UNIT/ML IV SOLN
500.0000 [IU] | Freq: Once | INTRAVENOUS | Status: AC
  Administered 2022-03-12: 500 [IU] via INTRAVENOUS
  Filled 2022-03-12: qty 5

## 2022-03-12 MED ORDER — SODIUM CHLORIDE 0.9% FLUSH
10.0000 mL | Freq: Once | INTRAVENOUS | Status: AC
  Administered 2022-03-12: 10 mL via INTRAVENOUS
  Filled 2022-03-12: qty 10

## 2022-05-02 IMAGING — US US BIOPSY CORE LIVER
1 series · 13 of 22 positions shown · non-contrast
Comparison: none

INDICATION: [AGE] male with multiple pulmonary nodules and liver lesions
concerning for metastatic disease. He presents for ultrasound-guided
core biopsy of 1 of the liver lesions.

[Series 1: us biopsy core liver · 0.23mm/px · 13 of 22 slices shown]
[im 1/22]
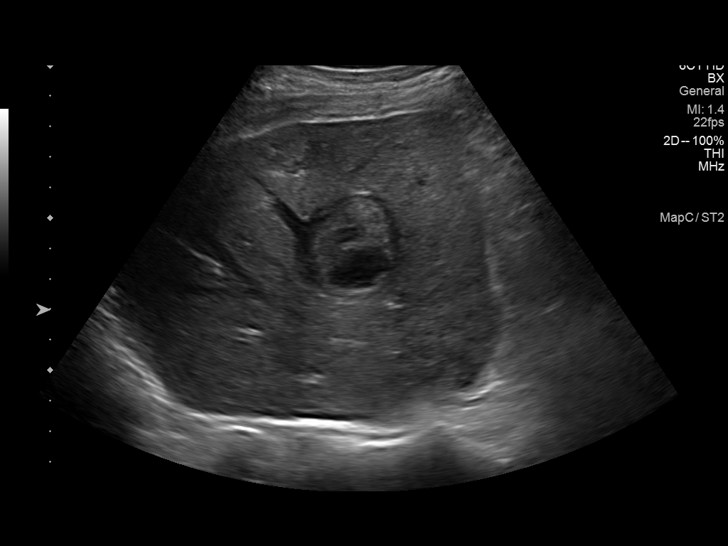
[im 3/22]
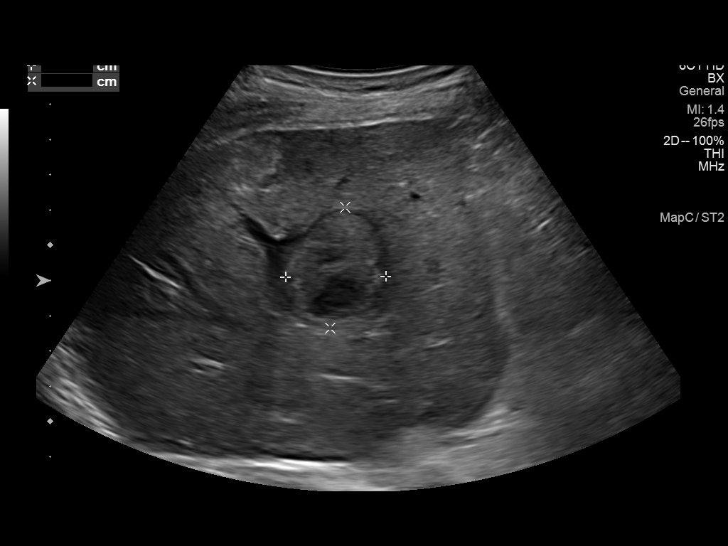
[im 5/22]
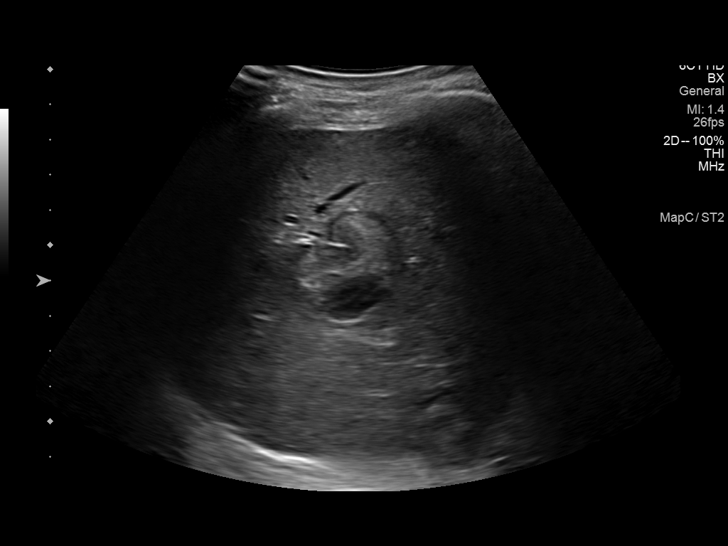
[im 6/22]
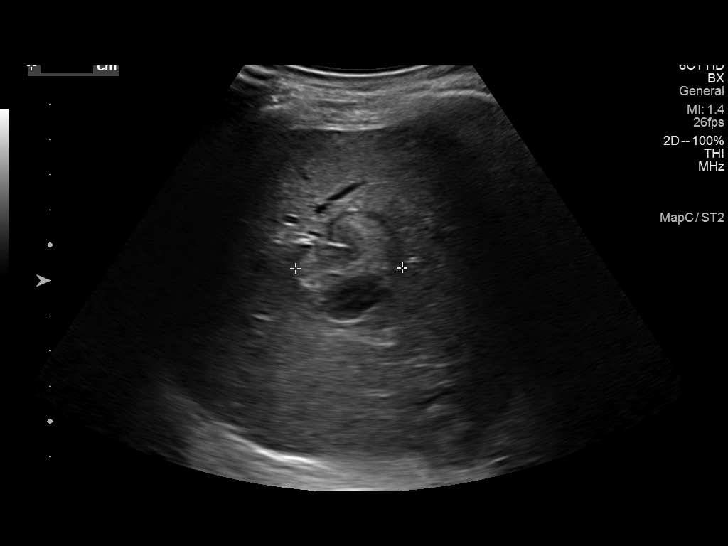
[im 8/22]
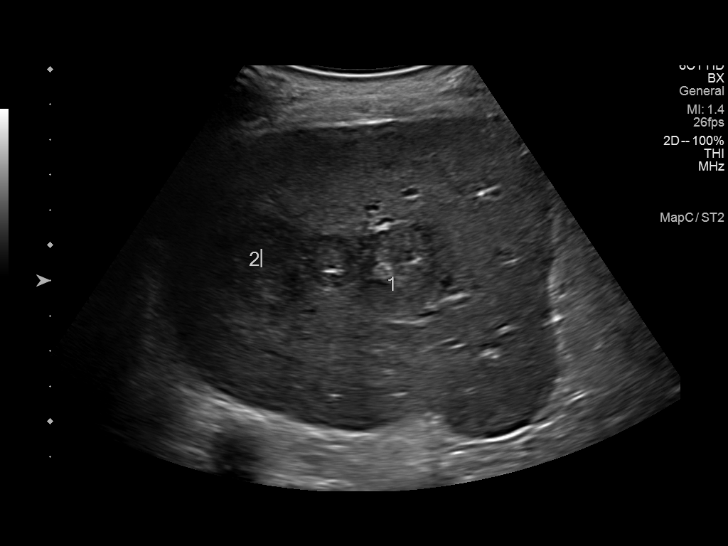
[im 10/22]
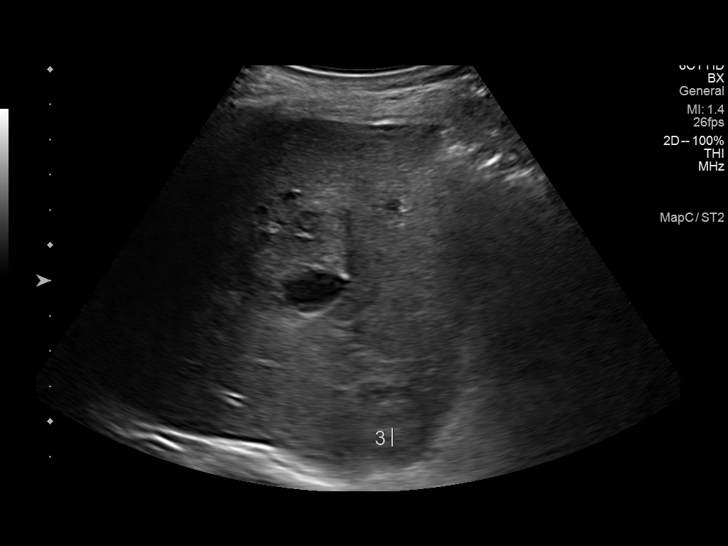
[im 12/22]
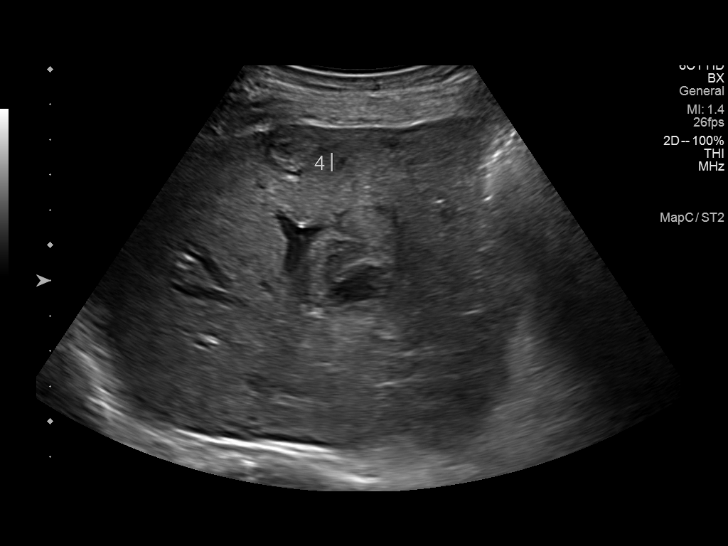
[im 13/22]
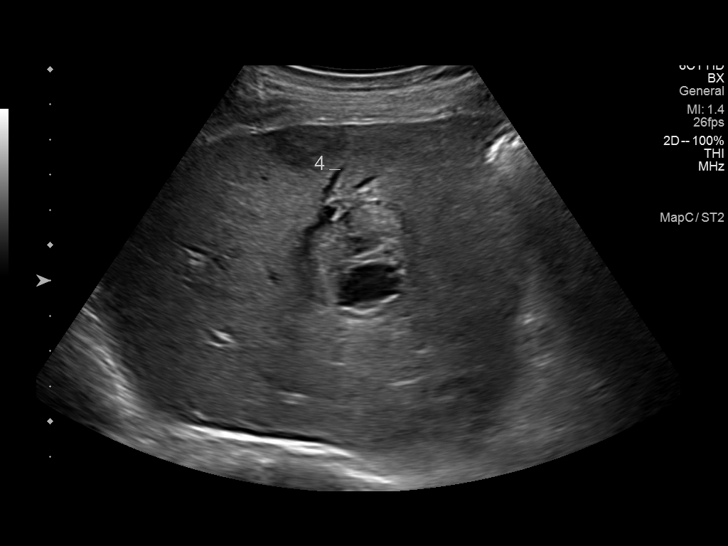
[im 15/22]
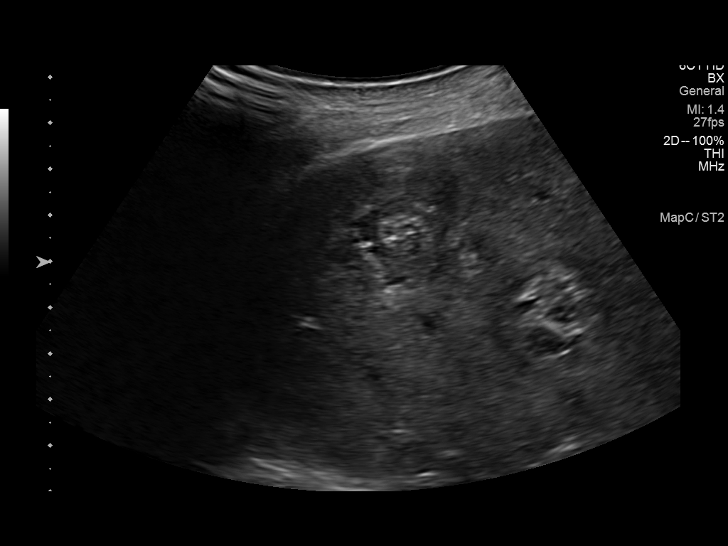
[im 17/22]
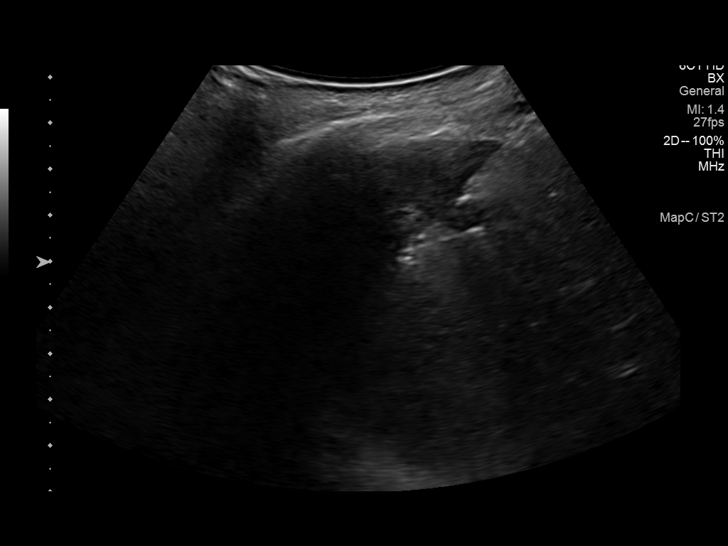
[im 18/22]
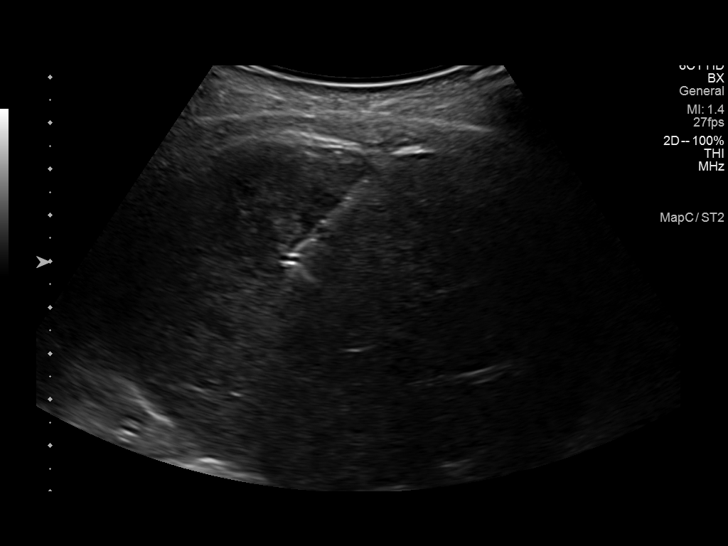
[im 20/22]
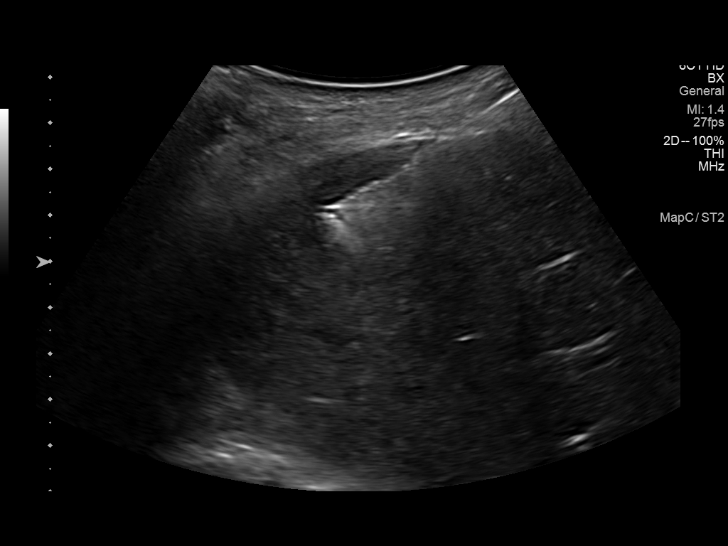
[im 22/22]
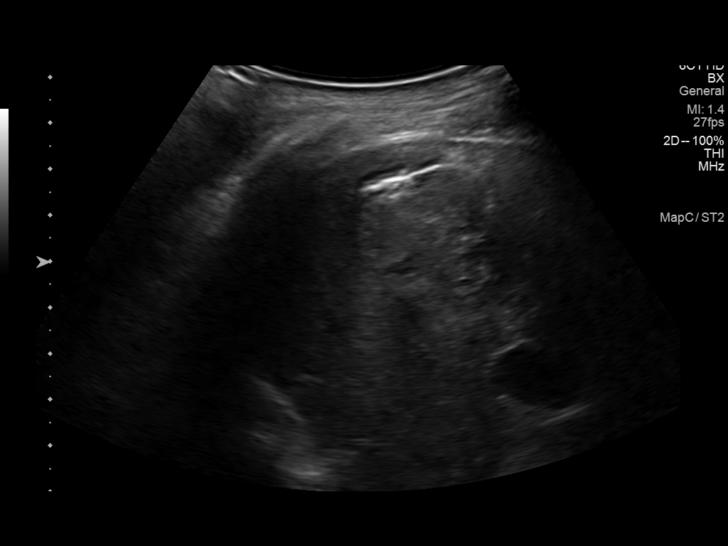

[13 of 22 positions shown; findings below may reference images not displayed]

EXAM:
ULTRASOUND BIOPSY CORE LIVER

MEDICATIONS:
None.

ANESTHESIA/SEDATION:
Moderate (conscious) sedation was employed during this procedure. A
total of Versed 0.5 mg and Fentanyl 50 mcg was administered
intravenously.

Moderate Sedation Time: 10 minutes. The patient's level of
consciousness and vital signs were monitored continuously by
radiology nursing throughout the procedure under my direct
supervision.

FLUOROSCOPY TIME:  None.

COMPLICATIONS:
None immediate.

PROCEDURE:
Informed written consent was obtained from the patient after a
thorough discussion of the procedural risks, benefits and
alternatives. All questions were addressed. A timeout was performed
prior to the initiation of the procedure.

Ultrasound was used to interrogate the liver. There are numerous
hypoechoic solid masses with areas of internal cystic degeneration
scattered throughout the liver. A suitable lesion in the posterior
right hepatic lobe was identified. A skin entry site was selected
and marked. The overlying skin was sterilely prepped and draped in
the standard fashion using chlorhexidine skin prep. Local anesthesia
was attained by infiltration with 1% lidocaine. A small dermatotomy
was made. Under real-time ultrasound guidance, a 17 gauge introducer
needle was advanced into the margin of the mass. Multiple 18 gauge
core biopsies were then coaxially obtained using the Indrani Parsley
automated biopsy device. Biopsy specimens were placed in formalin
and delivered to pathology for further analysis.

As the introducer needle was removed, the biopsy tract was embolized
with a Gel-Foam slurry. No evidence of postprocedural hemorrhage or
complication. The patient tolerated the procedure well.
IMPRESSION: Ultrasound-guided core biopsy of liver lesion.

## 2022-05-17 ENCOUNTER — Ambulatory Visit
Admission: RE | Admit: 2022-05-17 | Discharge: 2022-05-17 | Disposition: A | Discharge status: Home / Self Care | Payer: Medicare Other | Source: Ambulatory Visit | Attending: Oncology | Admitting: Oncology

## 2022-05-17 DIAGNOSIS — C439 Malignant melanoma of skin, unspecified: Secondary | ICD-10-CM | POA: Insufficient documentation

## 2022-05-17 MED ORDER — IOHEXOL 300 MG/ML  SOLN
85.0000 mL | Freq: Once | INTRAMUSCULAR | Status: AC | PRN
  Administered 2022-05-17: 85 mL via INTRAVENOUS

## 2022-05-21 ENCOUNTER — Inpatient Hospital Stay: Payer: Medicare Other

## 2022-05-21 ENCOUNTER — Other Ambulatory Visit: Payer: Self-pay | Admitting: *Deleted

## 2022-05-21 ENCOUNTER — Encounter: Payer: Self-pay | Admitting: Oncology

## 2022-05-21 ENCOUNTER — Inpatient Hospital Stay: Payer: Medicare Other | Attending: Oncology | Admitting: Oncology

## 2022-05-21 VITALS — BP 135/65 | HR 82 | Temp 94.9°F | Resp 18 | Ht 69.0 in | Wt 166.0 lb

## 2022-05-21 DIAGNOSIS — I700 Atherosclerosis of aorta: Secondary | ICD-10-CM | POA: Insufficient documentation

## 2022-05-21 DIAGNOSIS — K219 Gastro-esophageal reflux disease without esophagitis: Secondary | ICD-10-CM | POA: Diagnosis not present

## 2022-05-21 DIAGNOSIS — C7800 Secondary malignant neoplasm of unspecified lung: Secondary | ICD-10-CM | POA: Insufficient documentation

## 2022-05-21 DIAGNOSIS — Z85828 Personal history of other malignant neoplasm of skin: Secondary | ICD-10-CM | POA: Insufficient documentation

## 2022-05-21 DIAGNOSIS — C439 Malignant melanoma of skin, unspecified: Secondary | ICD-10-CM

## 2022-05-21 DIAGNOSIS — C787 Secondary malignant neoplasm of liver and intrahepatic bile duct: Secondary | ICD-10-CM | POA: Diagnosis not present

## 2022-05-21 DIAGNOSIS — N400 Enlarged prostate without lower urinary tract symptoms: Secondary | ICD-10-CM | POA: Diagnosis not present

## 2022-05-21 DIAGNOSIS — N189 Chronic kidney disease, unspecified: Secondary | ICD-10-CM | POA: Insufficient documentation

## 2022-05-21 DIAGNOSIS — Z7189 Other specified counseling: Secondary | ICD-10-CM | POA: Diagnosis not present

## 2022-05-21 DIAGNOSIS — E785 Hyperlipidemia, unspecified: Secondary | ICD-10-CM | POA: Diagnosis not present

## 2022-05-21 DIAGNOSIS — R21 Rash and other nonspecific skin eruption: Secondary | ICD-10-CM | POA: Diagnosis not present

## 2022-05-21 LAB — COMPREHENSIVE METABOLIC PANEL
ALT: 14 U/L (ref 0–44)
AST: 24 U/L (ref 15–41)
Albumin: 4 g/dL (ref 3.5–5.0)
Alkaline Phosphatase: 88 U/L (ref 38–126)
Anion gap: 10 (ref 5–15)
BUN: 20 mg/dL (ref 8–23)
CO2: 24 mmol/L (ref 22–32)
Calcium: 8.6 mg/dL — ABNORMAL LOW (ref 8.9–10.3)
Chloride: 104 mmol/L (ref 98–111)
Creatinine, Ser: 1.33 mg/dL — ABNORMAL HIGH (ref 0.61–1.24)
GFR, Estimated: 49 mL/min — ABNORMAL LOW (ref >60)
Glucose, Bld: 93 mg/dL (ref 70–99)
Potassium: 4.1 mmol/L (ref 3.5–5.1)
Sodium: 138 mmol/L (ref 135–145)
Total Bilirubin: 0.7 mg/dL (ref 0.3–1.2)
Total Protein: 6.9 g/dL (ref 6.5–8.1)

## 2022-05-21 LAB — CBC WITH DIFFERENTIAL/PLATELET
Abs Immature Granulocytes: 0.02 10*3/uL (ref 0.00–0.07)
Basophils Absolute: 0 10*3/uL (ref 0.0–0.1)
Basophils Relative: 0 %
Eosinophils Absolute: 0.1 10*3/uL (ref 0.0–0.5)
Eosinophils Relative: 2 %
HCT: 41.7 % (ref 39.0–52.0)
Hemoglobin: 13.4 g/dL (ref 13.0–17.0)
Immature Granulocytes: 0 %
Lymphocytes Relative: 24 %
Lymphs Abs: 1.6 10*3/uL (ref 0.7–4.0)
MCH: 30.1 pg (ref 26.0–34.0)
MCHC: 32.1 g/dL (ref 30.0–36.0)
MCV: 93.7 fL (ref 80.0–100.0)
Monocytes Absolute: 0.5 10*3/uL (ref 0.1–1.0)
Monocytes Relative: 8 %
Neutro Abs: 4.3 10*3/uL (ref 1.7–7.7)
Neutrophils Relative %: 66 %
Platelets: 186 10*3/uL (ref 150–400)
RBC: 4.45 MIL/uL (ref 4.22–5.81)
RDW: 12.7 % (ref 11.5–15.5)
WBC: 6.5 10*3/uL (ref 4.0–10.5)
nRBC: 0 % (ref 0.0–0.2)

## 2022-05-21 MED ORDER — SODIUM CHLORIDE 0.9% FLUSH
10.0000 mL | Freq: Once | INTRAVENOUS | Status: AC
  Administered 2022-05-21: 10 mL via INTRAVENOUS
  Filled 2022-05-21: qty 10

## 2022-05-21 MED ORDER — HEPARIN SOD (PORK) LOCK FLUSH 100 UNIT/ML IV SOLN
500.0000 [IU] | Freq: Once | INTRAVENOUS | Status: AC
  Administered 2022-05-21: 500 [IU] via INTRAVENOUS
  Filled 2022-05-21: qty 5

## 2022-05-21 NOTE — Progress Notes (Unsigned)
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  He has no significant comorbidities other than hyperlipidemia and GERD.  He has a history of superficial skin cancer.  He presented to the ER with symptoms of right-sided flank pain and had a CT renal stone study which showed a 2.1 x 1.1 cm right lower lobe pulmonary nodule.  Also noted to have multiple new ill-defined lesions throughout the right lobe of the liver largest measuring 3.4 x 2.8 cm.  Spleen and pancreas was unremarkable.  Low-attenuation lesion in the right kidney 2.6 x 1.2 cm likely representing a cyst.  No intra-abdominal adenopathy.  Prostate gland is enlarged measuring 6.2 x 6.5 x 5.3 cm.  Patient has some baseline CKD and his creatinine is around 1.3.   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  NGS testing Showed following genomic variants: EPHA3 R136, NF 1R 440, PB RM1 K61, PBR M1 K702 fs, PTPRT Q469, RAD54L Y335A.  PD-L1 60%.  Negative for BRAF.  MRI brain was negative for metastases.   Single agent keytruda started in June 2021.  Treatment on hold since December 2022 due to development of significant skin rash  Interval history-patient denies any complaints at this time.  He lives alone and sometimes gets lost when he drives.  He was not sure  where to park today  ECOG PS- 1 Pain scale- 0   Review of systems- Review of Systems  Constitutional:  Negative for chills, fever, malaise/fatigue and weight loss.  HENT:  Negative for congestion, ear discharge and nosebleeds.   Eyes:  Negative for blurred vision.  Respiratory:  Negative for cough, hemoptysis, sputum production, shortness of breath and wheezing.   Cardiovascular:  Negative for chest pain, palpitations, orthopnea and claudication.  Gastrointestinal:  Negative for abdominal pain, blood in stool, constipation, diarrhea, heartburn, melena, nausea and vomiting.  Genitourinary:  Negative for dysuria, flank pain, frequency, hematuria and urgency.  Musculoskeletal:  Negative for back pain, joint pain and myalgias.  Skin:  Negative for rash.  Neurological:  Negative for dizziness, tingling, focal weakness, seizures, weakness and headaches.  Endo/Heme/Allergies:  Does not bruise/bleed easily.  Psychiatric/Behavioral:  Negative for depression and suicidal ideas. The patient does not have insomnia.       No Known Allergies   Past Medical History:  Diagnosis Date   Melanoma ([facility])      Past Surgical History:  Procedure Laterality Date   CATARACT EXTRACTION Bilateral 2000   HERNIA REPAIR     MELANOMA EXCISION     PORTA CATH INSERTION N/A 06/28/2019   Procedure: PORTA CATH INSERTION;  Surgeon: Annice Needy, MD;  Location: [facility];  Service: Cardiovascular;  Laterality: N/A;    Social History   Socioeconomic History   Marital status: Widowed    Spouse name: Not on file  Number of children: Not on file   Years of education: Not on file   Highest education level: Not on file  Occupational History   Not on file  Tobacco Use   Smoking status: Never   Smokeless tobacco: Never  Vaping Use   Vaping Use: Never used  Substance and Sexual Activity   Alcohol use: Never   Drug use: Not Currently   Sexual activity: Not Currently  Other Topics Concern   Not  on file  Social History Narrative   Lives at home by himself    Social Determinants of Health   Financial Resource Strain: Not on file  Food Insecurity: Not on file  Transportation Needs: Not on file  Physical Activity: Not on file  Stress: Not on file  Social Connections: Not on file  Intimate Partner Violence: Not on file    History reviewed. No pertinent family history.   Current Outpatient Medications:    acetaminophen (TYLENOL) 325 MG tablet, Take 650 mg by mouth every 6 (six) hours as needed. , Disp: , Rfl:    Loratadine 10 MG CAPS, Take 1 capsule by mouth daily as needed. , Disp: , Rfl:   Physical exam:  Vitals:   05/21/22 1136 05/21/22 1146  BP: (!) 144/65 135/65  Pulse: 64 82  Resp: 18   Temp: (!) 94.9 F (34.9 C)   TempSrc: Tympanic   SpO2: 100% 97%  Weight: 166 lb (75.3 kg)   Height: 5\' 9"  (1.753 m)    Physical Exam Cardiovascular:     Rate and Rhythm: Normal rate and regular rhythm.     Heart sounds: Normal heart sounds.  Pulmonary:     Effort: Pulmonary effort is normal.     Breath sounds: Normal breath sounds.  Abdominal:     General: Bowel sounds are normal.     Palpations: Abdomen is soft.  Skin:    General: Skin is warm and dry.  Neurological:     Mental Status: He is alert and oriented to person, place, and time.         Latest Ref Rng & Units 05/21/2022   12:12 PM  CMP  Glucose 70 - 99 mg/dL 93   BUN 8 - 23 mg/dL 20   Creatinine 1.61 - 1.24 mg/dL 0.96   Sodium 045 - 409 mmol/L 138   Potassium 3.5 - 5.1 mmol/L 4.1   Chloride 98 - 111 mmol/L 104   CO2 22 - 32 mmol/L 24   Calcium 8.9 - 10.3 mg/dL 8.6   Total Protein 6.5 - 8.1 g/dL 6.9   Total Bilirubin 0.3 - 1.2 mg/dL 0.7   Alkaline Phos 38 - 126 U/L 88   AST 15 - 41 U/L 24   ALT 0 - 44 U/L 14       Latest Ref Rng & Units 05/21/2022   12:12 PM  CBC  WBC 4.0 - 10.5 K/uL 6.5   Hemoglobin 13.0 - 17.0 g/dL 81.1   Hematocrit 91.4 - 52.0 % 41.7   Platelets 150 - 400 K/uL 186      No images are attached to the encounter.  CT CHEST ABDOMEN PELVIS W CONTRAST  Result Date: 05/19/2022 CLINICAL DATA:  Metastatic melanoma. Assess treatment response. Mets to lung and liver diagnosis 2021. * Tracking Code: BO * EXAM: CT CHEST, ABDOMEN, AND PELVIS WITH CONTRAST TECHNIQUE: Multidetector CT imaging of the chest, abdomen and pelvis was performed following the standard protocol during bolus administration of intravenous contrast. RADIATION  DOSE REDUCTION: This exam was performed according to the departmental dose-optimization program which includes automated exposure control, adjustment of the mA and/or kV according to patient size and/or use of iterative reconstruction technique. CONTRAST:  85mL OMNIPAQUE IOHEXOL 300 MG/ML  SOLN COMPARISON:  CT 08/10/2021 FINDINGS: CT CHEST FINDINGS Cardiovascular: No significant vascular findings. Normal heart size. No pericardial effusion. Insert clear port Mediastinum/Nodes: No axillary or supraclavicular adenopathy. No mediastinal or hilar adenopathy. No pericardial fluid. Esophagus normal. Lungs/Pleura: Marked elevation of the RIGHT hemidiaphragm. Mild atelectasis at the RIGHT lung base similar to prior. No suspicious pulmonary nodules. Musculoskeletal: No aggressive osseous lesion. CT ABDOMEN AND PELVIS FINDINGS Hepatobiliary: Benign cyst in the central LEFT hepatic lobe unchanged. Normal gallbladder. Pancreas: Pancreas is normal. No ductal dilatation. No pancreatic inflammation. Spleen: Normal spleen Adrenals/urinary tract: Adrenal glands normal. Elongated cystic lesion of the RIGHT kidney measures 2.2 cm has low attenuation (HU equal 23) lesion not changed from CT 2022 which time it has simple fluid attenuation. Favor benign cystic lesion (Bosniak 2). No specific follow-up recommended. LEFT kidney normal.  Ureters bladder Stomach/Bowel: Stomach, small bowel, appendix, and cecum are normal. The cecum is in the RIGHT upper quadrant related to the elevated  RIGHT hemidiaphragm. Multiple diverticula of the descending colon and sigmoid colon without acute inflammation. Vascular/Lymphatic: Abdominal aorta is normal caliber with atherosclerotic calcification. There is no retroperitoneal or periportal lymphadenopathy. No pelvic lymphadenopathy. Reproductive: Prostate is enlarged. Other: No peritoneal retroperitoneal nodularity. Musculoskeletal: No aggressive osseous lesion. Multiple chronic endplate compression fractures. IMPRESSION: CHEST IMPRESSION: 1. No evidence of melanoma recurrence or metastasis in the thorax. 2. Chronic elevation of the RIGHT hemidiaphragm. PELVIS IMPRESSION: 1. No evidence of melanoma recurrence or metastasis in the abdomen pelvis. 2. No skeletal metastasis. 3.  Aortic Atherosclerosis (ICD10-I70.0). 4. No evidence of immunotherapy toxicity. Electronically Signed   By: Genevive Bi M.D.   On: 05/19/2022 11:30     Assessment and plan- Patient is a 89 y.o. male with metastatic melanoma BRAF negative with lung and liver metastases who was on maintenance Keytruda currently off treatment .  He is here to discuss CT scan results and further management  I have reviewed CT chest abdomen and pelvis images independently and discussed findings with the patient which does not show any evidence of recurrent or progressive disease.  He has been off [place] since December 2022 without any evidence of recurrent disease.  He will also be getting repeat MRI brain with and without contrast on 06/02/2022.  Patient does have some problems with executive functioning and memory likely secondary to age-related dementia and we had to get somebody from his friend circle to take him back home since we were concerned about his safety and getting back home alone.  We have informed his niece about this as well.   Visit Diagnosis 1. Metastatic malignant melanoma (HCC)   2. Goals of care, counseling/discussion      Dr. Owens Shark, MD, MPH [facility] [phone] 05/21/2022 4:38 PM

## 2022-06-02 ENCOUNTER — Ambulatory Visit
Admission: RE | Admit: 2022-06-02 | Discharge: 2022-06-02 | Disposition: A | Discharge status: Home / Self Care | Payer: Medicare Other | Source: Ambulatory Visit | Attending: Oncology | Admitting: Oncology

## 2022-06-02 DIAGNOSIS — C439 Malignant melanoma of skin, unspecified: Secondary | ICD-10-CM | POA: Insufficient documentation

## 2022-06-02 MED ORDER — GADOBUTROL 1 MMOL/ML IV SOLN
7.0000 mL | Freq: Once | INTRAVENOUS | Status: AC | PRN
  Administered 2022-06-02: 7 mL via INTRAVENOUS

## 2022-06-04 ENCOUNTER — Inpatient Hospital Stay: Payer: Medicare Other

## 2022-06-14 ENCOUNTER — Encounter: Payer: Self-pay | Admitting: Oncology

## 2022-06-14 ENCOUNTER — Inpatient Hospital Stay: Payer: Medicare Other | Attending: Oncology | Admitting: Oncology

## 2022-06-14 VITALS — BP 119/50 | HR 71 | Temp 94.8°F | Resp 18 | Ht 69.0 in | Wt 164.0 lb

## 2022-06-14 DIAGNOSIS — C787 Secondary malignant neoplasm of liver and intrahepatic bile duct: Secondary | ICD-10-CM | POA: Diagnosis not present

## 2022-06-14 DIAGNOSIS — C439 Malignant melanoma of skin, unspecified: Secondary | ICD-10-CM | POA: Diagnosis not present

## 2022-06-14 DIAGNOSIS — C7800 Secondary malignant neoplasm of unspecified lung: Secondary | ICD-10-CM | POA: Insufficient documentation

## 2022-06-14 NOTE — Progress Notes (Signed)
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  He has no significant comorbidities other than hyperlipidemia and GERD.  He has a history of superficial skin cancer.  He presented to the ER with symptoms of right-sided flank pain and had a CT renal stone study which showed a 2.1 x 1.1 cm right lower lobe pulmonary nodule.  Also noted to have multiple new ill-defined lesions throughout the right lobe of the liver largest measuring 3.4 x 2.8 cm.  Spleen and pancreas was unremarkable.  Low-attenuation lesion in the right kidney 2.6 x 1.2 cm likely representing a cyst.  No intra-abdominal adenopathy.  Prostate gland is enlarged measuring 6.2 x 6.5 x 5.3 cm.  Patient has some baseline CKD and his creatinine is around 1.3.   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  NGS testing Showed following genomic variants: EPHA3 R136, NF 1R 440, PB RM1 K61, PBR M1 K702 fs, PTPRT Q469, RAD54L Y335A.  PD-L1 60%.  Negative for BRAF.  MRI brain was negative for metastases.   Single agent keytruda started in June 2021.  Treatment on hold since December 2022 due to development of significant skin rash    Interval history-patient is doing well for his age.  No acute issues since last visit  ECOG PS- 1 Pain scale- 0   Review  of systems- Review of Systems  Constitutional:  Negative for chills, fever, malaise/fatigue and weight loss.  HENT:  Negative for congestion, ear discharge and nosebleeds.   Eyes:  Negative for blurred vision.  Respiratory:  Negative for cough, hemoptysis, sputum production, shortness of breath and wheezing.   Cardiovascular:  Negative for chest pain, palpitations, orthopnea and claudication.  Gastrointestinal:  Negative for abdominal pain, blood in stool, constipation, diarrhea, heartburn, melena, nausea and vomiting.  Genitourinary:  Negative for dysuria, flank pain, frequency, hematuria and urgency.  Musculoskeletal:  Negative for back pain, joint pain and myalgias.  Skin:  Negative for rash.  Neurological:  Negative for dizziness, tingling, focal weakness, seizures, weakness and headaches.  Endo/Heme/Allergies:  Does not bruise/bleed easily.  Psychiatric/Behavioral:  Negative for depression and suicidal ideas. The patient does not have insomnia.       No Known Allergies   Past Medical History:  Diagnosis Date   Melanoma (HCC)      Past Surgical History:  Procedure Laterality Date   CATARACT EXTRACTION Bilateral 2000   HERNIA REPAIR     MELANOMA EXCISION     PORTA CATH INSERTION N/A 06/28/2019   Procedure: PORTA CATH INSERTION;  Surgeon: Annice Needy, MD;  Location: [facility] INVASIVE CV LAB;  Service: Cardiovascular;  Laterality: N/A;    Social History   Socioeconomic History   Marital status: Widowed    Spouse name: Not on file   Number of children: Not on file   Years of  education: Not on file   Highest education level: Not on file  Occupational History   Not on file  Tobacco Use   Smoking status: Never   Smokeless tobacco: Never  Vaping Use   Vaping Use: Never used  Substance and Sexual Activity   Alcohol use: Never   Drug use: Not Currently   Sexual activity: Not Currently  Other Topics Concern   Not on file  Social History Narrative   Lives at home by  himself    Social Determinants of Health   Financial Resource Strain: Not on file  Food Insecurity: Not on file  Transportation Needs: Not on file  Physical Activity: Not on file  Stress: Not on file  Social Connections: Not on file  Intimate Partner Violence: Not on file    History reviewed. No pertinent family history.   Current Outpatient Medications:    acetaminophen (TYLENOL) 325 MG tablet, Take 650 mg by mouth every 6 (six) hours as needed. , Disp: , Rfl:    Loratadine 10 MG CAPS, Take 1 capsule by mouth daily as needed. , Disp: , Rfl:   Physical exam:  Vitals:   06/14/22 0945  BP: (!) 119/50  Pulse: 71  Resp: 18  Temp: (!) 94.8 F (34.9 C)  TempSrc: Tympanic  SpO2: 99%  Weight: 164 lb (74.4 kg)  Height: 5\' 9"  (1.753 m)   Physical Exam Cardiovascular:     Rate and Rhythm: Normal rate and regular rhythm.     Heart sounds: Normal heart sounds.  Pulmonary:     Effort: Pulmonary effort is normal.     Breath sounds: Normal breath sounds.  Skin:    General: Skin is warm and dry.  Neurological:     Mental Status: He is alert and oriented to person, place, and time.         Latest Ref Rng & Units 05/21/2022   12:12 PM  CMP  Glucose 70 - 99 mg/dL 93   BUN 8 - 23 mg/dL 20   Creatinine 4.09 - 1.24 mg/dL 8.11   Sodium 914 - 782 mmol/L 138   Potassium 3.5 - 5.1 mmol/L 4.1   Chloride 98 - 111 mmol/L 104   CO2 22 - 32 mmol/L 24   Calcium 8.9 - 10.3 mg/dL 8.6   Total Protein 6.5 - 8.1 g/dL 6.9   Total Bilirubin 0.3 - 1.2 mg/dL 0.7   Alkaline Phos 38 - 126 U/L 88   AST 15 - 41 U/L 24   ALT 0 - 44 U/L 14       Latest Ref Rng & Units 05/21/2022   12:12 PM  CBC  WBC 4.0 - 10.5 K/uL 6.5   Hemoglobin 13.0 - 17.0 g/dL 95.6   Hematocrit 21.3 - 52.0 % 41.7   Platelets 150 - 400 K/uL 186     No images are attached to the encounter.  MR Brain W Wo Contrast  Result Date: 06/10/2022 CLINICAL DATA:  Metastatic melanoma.  Memory disturbance. EXAM: MRI HEAD WITHOUT  AND WITH CONTRAST TECHNIQUE: Multiplanar, multiecho pulse sequences of the brain and surrounding structures were obtained without and with intravenous contrast. CONTRAST:  7mL GADAVIST GADOBUTROL 1 MMOL/ML IV SOLN COMPARISON:  Head MRI 08/10/2021 FINDINGS: Brain: There is no evidence of an acute infarct, mass, midline shift, or extra-axial fluid collection. 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 No suspicious intracranial enhancement is  seen to suggest metastatic disease. T2 hyperintensities in the cerebral white matter and pons are unchanged and nonspecific but compatible with mild chronic small vessel ischemic disease. A chronic lacunar infarct is again seen in the left thalamus. Mild generalized cerebral atrophy is unchanged. Vascular: Major intracranial vascular flow voids are preserved. Skull and upper cervical spine: Unremarkable bone marrow signal. Sinuses/Orbits: Bilateral cataract extraction Paranasal sinuses and mastoid air cells are clear. Other: None. IMPRESSION: 1. No evidence of intracranial metastases or acute abnormality. 2. Mild chronic small vessel ischemic disease. Electronically Signed   By: Sebastian Ache M.D.   On: 06/10/2022 10:40   CT CHEST ABDOMEN PELVIS W CONTRAST  Result Date: 05/19/2022 CLINICAL DATA:  Metastatic melanoma. Assess treatment response. Mets to lung and liver diagnosis 2021. * Tracking Code: BO * EXAM: CT CHEST, ABDOMEN, AND PELVIS WITH CONTRAST TECHNIQUE: Multidetector CT imaging of the chest, abdomen and pelvis was performed following the standard protocol during bolus administration of intravenous contrast. RADIATION DOSE REDUCTION: This exam was performed according to the departmental dose-optimization program which includes automated exposure control, adjustment of the mA and/or kV according to patient size and/or use of iterative reconstruction  technique. CONTRAST:  85mL OMNIPAQUE IOHEXOL 300 MG/ML  SOLN COMPARISON:  CT 08/10/2021 FINDINGS: CT CHEST FINDINGS Cardiovascular: No significant vascular findings. Normal heart size. No pericardial effusion. Insert clear port Mediastinum/Nodes: No axillary or supraclavicular adenopathy. No mediastinal or hilar adenopathy. No pericardial fluid. Esophagus normal. Lungs/Pleura: Marked elevation of the RIGHT hemidiaphragm. Mild atelectasis at the RIGHT lung base similar to prior. No suspicious pulmonary nodules. Musculoskeletal: No aggressive osseous lesion. CT ABDOMEN AND PELVIS FINDINGS Hepatobiliary: Benign cyst in the central LEFT hepatic lobe unchanged. Normal gallbladder. Pancreas: Pancreas is normal. No ductal dilatation. No pancreatic inflammation. Spleen: Normal spleen Adrenals/urinary tract: Adrenal glands normal. Elongated cystic lesion of the RIGHT kidney measures 2.2 cm has low attenuation (HU equal 23) lesion not changed from CT 2022 which time it has simple fluid attenuation. Favor benign cystic lesion (Bosniak 2). No specific follow-up recommended. LEFT kidney normal.  Ureters bladder Stomach/Bowel: Stomach, small bowel, appendix, and cecum are normal. The cecum is in the RIGHT upper quadrant related to the elevated RIGHT hemidiaphragm. Multiple diverticula of the descending colon and sigmoid colon without acute inflammation. Vascular/Lymphatic: Abdominal aorta is normal caliber with atherosclerotic calcification. There is no retroperitoneal or periportal lymphadenopathy. No pelvic lymphadenopathy. Reproductive: Prostate is enlarged. Other: No peritoneal retroperitoneal nodularity. Musculoskeletal: No aggressive osseous lesion. Multiple chronic endplate compression fractures. IMPRESSION: CHEST IMPRESSION: 1. No evidence of melanoma recurrence or metastasis in the thorax. 2. Chronic elevation of the RIGHT hemidiaphragm. PELVIS IMPRESSION: 1. No evidence of melanoma recurrence or metastasis in the  abdomen pelvis. 2. No skeletal metastasis. 3.  Aortic Atherosclerosis (ICD10-I70.0). 4. No evidence of immunotherapy toxicity. Electronically Signed   By: Genevive Bi M.D.   On: 05/19/2022 11:30     Assessment and plan- Patient is a 89 y.o. male with metastatic melanoma BRAF negative with lung and liver metastases who was on maintenance Keytruda currently off treatment .  He is here to discuss MRI brain results and further management  MRI brain did not show any evidence of metastatic disease.  He has not had any known metastatic disease to his brain in the past as well.  His systemic scans are also clear and therefore he has remained off treatment since December 2022.  I will see him back in 6 months with CT chest abdomen and pelvis with contrast  Visit Diagnosis 1. Metastatic malignant melanoma (HCC)      Dr. Owens Shark, MD, MPH [facility] at [facility] [phone] 06/14/2022 1:02 PM

## 2022-08-07 ENCOUNTER — Inpatient Hospital Stay
Admission: EM | Admit: 2022-08-07 | Discharge: 2022-08-12 | DRG: 871 | Disposition: E | Payer: Medicare Other | Attending: Internal Medicine | Admitting: Internal Medicine

## 2022-08-07 ENCOUNTER — Emergency Department: Payer: Medicare Other

## 2022-08-07 ENCOUNTER — Other Ambulatory Visit: Payer: Self-pay

## 2022-08-07 DIAGNOSIS — Z85820 Personal history of malignant melanoma of skin: Secondary | ICD-10-CM

## 2022-08-07 DIAGNOSIS — Z66 Do not resuscitate: Secondary | ICD-10-CM | POA: Diagnosis present

## 2022-08-07 DIAGNOSIS — R188 Other ascites: Secondary | ICD-10-CM | POA: Diagnosis present

## 2022-08-07 DIAGNOSIS — C787 Secondary malignant neoplasm of liver and intrahepatic bile duct: Secondary | ICD-10-CM | POA: Diagnosis present

## 2022-08-07 DIAGNOSIS — J9601 Acute respiratory failure with hypoxia: Secondary | ICD-10-CM | POA: Diagnosis present

## 2022-08-07 DIAGNOSIS — K7200 Acute and subacute hepatic failure without coma: Secondary | ICD-10-CM | POA: Diagnosis present

## 2022-08-07 DIAGNOSIS — Z602 Problems related to living alone: Secondary | ICD-10-CM | POA: Diagnosis present

## 2022-08-07 DIAGNOSIS — C7800 Secondary malignant neoplasm of unspecified lung: Secondary | ICD-10-CM | POA: Diagnosis present

## 2022-08-07 DIAGNOSIS — A419 Sepsis, unspecified organism: Secondary | ICD-10-CM

## 2022-08-07 DIAGNOSIS — Z1152 Encounter for screening for COVID-19: Secondary | ICD-10-CM

## 2022-08-07 DIAGNOSIS — Z515 Encounter for palliative care: Secondary | ICD-10-CM

## 2022-08-07 DIAGNOSIS — A4151 Sepsis due to Escherichia coli [E. coli]: Principal | ICD-10-CM | POA: Diagnosis present

## 2022-08-07 DIAGNOSIS — S2241XA Multiple fractures of ribs, right side, initial encounter for closed fracture: Secondary | ICD-10-CM | POA: Diagnosis present

## 2022-08-07 DIAGNOSIS — N400 Enlarged prostate without lower urinary tract symptoms: Secondary | ICD-10-CM | POA: Diagnosis present

## 2022-08-07 DIAGNOSIS — E785 Hyperlipidemia, unspecified: Secondary | ICD-10-CM | POA: Diagnosis present

## 2022-08-07 DIAGNOSIS — E872 Acidosis, unspecified: Secondary | ICD-10-CM | POA: Diagnosis present

## 2022-08-07 DIAGNOSIS — I2510 Atherosclerotic heart disease of native coronary artery without angina pectoris: Secondary | ICD-10-CM | POA: Diagnosis present

## 2022-08-07 DIAGNOSIS — M8580 Other specified disorders of bone density and structure, unspecified site: Secondary | ICD-10-CM | POA: Diagnosis present

## 2022-08-07 DIAGNOSIS — N170 Acute kidney failure with tubular necrosis: Secondary | ICD-10-CM | POA: Diagnosis present

## 2022-08-07 DIAGNOSIS — Z9842 Cataract extraction status, left eye: Secondary | ICD-10-CM | POA: Diagnosis not present

## 2022-08-07 DIAGNOSIS — R55 Syncope and collapse: Secondary | ICD-10-CM | POA: Diagnosis present

## 2022-08-07 DIAGNOSIS — Z9841 Cataract extraction status, right eye: Secondary | ICD-10-CM | POA: Diagnosis not present

## 2022-08-07 DIAGNOSIS — R6521 Severe sepsis with septic shock: Secondary | ICD-10-CM | POA: Diagnosis present

## 2022-08-07 DIAGNOSIS — Y92410 Unspecified street and highway as the place of occurrence of the external cause: Secondary | ICD-10-CM

## 2022-08-07 DIAGNOSIS — I471 Supraventricular tachycardia, unspecified: Secondary | ICD-10-CM | POA: Diagnosis present

## 2022-08-07 LAB — CBC WITH DIFFERENTIAL/PLATELET
Abs Immature Granulocytes: 0.16 10*3/uL — ABNORMAL HIGH (ref 0.00–0.07)
Basophils Absolute: 0.1 10*3/uL (ref 0.0–0.1)
Basophils Relative: 0 %
Eosinophils Absolute: 0 10*3/uL (ref 0.0–0.5)
Eosinophils Relative: 0 %
HCT: 43.2 % (ref 39.0–52.0)
Hemoglobin: 13.8 g/dL (ref 13.0–17.0)
Immature Granulocytes: 1 %
Lymphocytes Relative: 6 %
Lymphs Abs: 1.2 10*3/uL (ref 0.7–4.0)
MCH: 30.4 pg (ref 26.0–34.0)
MCHC: 31.9 g/dL (ref 30.0–36.0)
MCV: 95.2 fL (ref 80.0–100.0)
Monocytes Absolute: 0.9 10*3/uL (ref 0.1–1.0)
Monocytes Relative: 4 %
Neutro Abs: 18.4 10*3/uL — ABNORMAL HIGH (ref 1.7–7.7)
Neutrophils Relative %: 89 %
Platelets: 349 10*3/uL (ref 150–400)
RBC: 4.54 MIL/uL (ref 4.22–5.81)
RDW: 12.4 % (ref 11.5–15.5)
WBC: 20.7 10*3/uL — ABNORMAL HIGH (ref 4.0–10.5)
nRBC: 0 % (ref 0.0–0.2)

## 2022-08-07 LAB — PROTIME-INR
INR: 1.5 — ABNORMAL HIGH (ref 0.8–1.2)
Prothrombin Time: 17.9 seconds — ABNORMAL HIGH (ref 11.4–15.2)

## 2022-08-07 LAB — BASIC METABOLIC PANEL
Anion gap: 10 (ref 5–15)
BUN: 27 mg/dL — ABNORMAL HIGH (ref 8–23)
CO2: 23 mmol/L (ref 22–32)
Calcium: 8.8 mg/dL — ABNORMAL LOW (ref 8.9–10.3)
Chloride: 104 mmol/L (ref 98–111)
Creatinine, Ser: 1.81 mg/dL — ABNORMAL HIGH (ref 0.61–1.24)
GFR, Estimated: 34 mL/min — ABNORMAL LOW (ref >60)
Glucose, Bld: 144 mg/dL — ABNORMAL HIGH (ref 70–99)
Potassium: 4.9 mmol/L (ref 3.5–5.1)
Sodium: 137 mmol/L (ref 135–145)

## 2022-08-07 LAB — GLUCOSE, CAPILLARY: Glucose-Capillary: 124 mg/dL — ABNORMAL HIGH (ref 70–99)

## 2022-08-07 LAB — RESP PANEL BY RT-PCR (RSV, FLU A&B, COVID)  RVPGX2
Influenza A by PCR: NEGATIVE
Influenza B by PCR: NEGATIVE
Resp Syncytial Virus by PCR: NEGATIVE
SARS Coronavirus 2 by RT PCR: NEGATIVE

## 2022-08-07 LAB — LACTIC ACID, PLASMA: Lactic Acid, Venous: 4 mmol/L (ref 0.5–1.9)

## 2022-08-07 LAB — APTT: aPTT: 38 seconds — ABNORMAL HIGH (ref 24–36)

## 2022-08-07 LAB — TROPONIN I (HIGH SENSITIVITY)
Troponin I (High Sensitivity): 9 ng/L (ref <18)
Troponin I (High Sensitivity): 14 ng/L (ref <18)

## 2022-08-07 LAB — CBG MONITORING, ED: Glucose-Capillary: 113 mg/dL — ABNORMAL HIGH (ref 70–99)

## 2022-08-07 LAB — PROCALCITONIN: Procalcitonin: 0.44 ng/mL

## 2022-08-07 MED ORDER — DILTIAZEM HCL 25 MG/5ML IV SOLN
10.0000 mg | Freq: Once | INTRAVENOUS | Status: AC
  Administered 2022-08-07: 10 mg via INTRAVENOUS
  Filled 2022-08-07: qty 5

## 2022-08-07 MED ORDER — IOHEXOL 350 MG/ML SOLN
75.0000 mL | Freq: Once | INTRAVENOUS | Status: AC | PRN
  Administered 2022-08-07: 75 mL via INTRAVENOUS

## 2022-08-07 MED ORDER — INSULIN ASPART 100 UNIT/ML IJ SOLN
0.0000 [IU] | INTRAMUSCULAR | Status: DC
  Administered 2022-08-08: 1 [IU] via SUBCUTANEOUS
  Filled 2022-08-07: qty 1

## 2022-08-07 MED ORDER — LACTATED RINGERS IV BOLUS (SEPSIS)
500.0000 mL | Freq: Once | INTRAVENOUS | Status: AC
  Administered 2022-08-07: 500 mL via INTRAVENOUS

## 2022-08-07 MED ORDER — POLYETHYLENE GLYCOL 3350 17 G PO PACK: 17.0000 g | PACK | Freq: Every day | ORAL | Status: DC | PRN

## 2022-08-07 MED ORDER — SODIUM CHLORIDE 0.9 % IV SOLN
2.0000 g | Freq: Once | INTRAVENOUS | Status: AC
  Administered 2022-08-07: 2 g via INTRAVENOUS
  Filled 2022-08-07: qty 12.5

## 2022-08-07 MED ORDER — DOCUSATE SODIUM 100 MG PO CAPS: 100.0000 mg | ORAL_CAPSULE | Freq: Two times a day (BID) | ORAL | Status: DC | PRN

## 2022-08-07 MED ORDER — LACTATED RINGERS IV SOLN: INTRAVENOUS | Status: DC

## 2022-08-07 MED ORDER — LACTATED RINGERS IV BOLUS (SEPSIS)
1000.0000 mL | Freq: Once | INTRAVENOUS | Status: AC
  Administered 2022-08-07: 1000 mL via INTRAVENOUS

## 2022-08-07 MED ORDER — ACETAMINOPHEN 325 MG RE SUPP
650.0000 mg | Freq: Once | RECTAL | Status: AC
  Administered 2022-08-07: 650 mg via RECTAL
  Filled 2022-08-07: qty 2

## 2022-08-07 MED ORDER — VANCOMYCIN HCL IN DEXTROSE 1-5 GM/200ML-% IV SOLN: 1000.0000 mg | Freq: Once | INTRAVENOUS | Status: DC

## 2022-08-07 MED ORDER — SODIUM CHLORIDE 0.9 % IV SOLN
250.0000 mL | INTRAVENOUS | Status: DC
  Administered 2022-08-08: 250 mL via INTRAVENOUS

## 2022-08-07 MED ORDER — METRONIDAZOLE 500 MG/100ML IV SOLN
500.0000 mg | Freq: Once | INTRAVENOUS | Status: AC
  Administered 2022-08-07: 500 mg via INTRAVENOUS
  Filled 2022-08-07: qty 100

## 2022-08-07 MED ORDER — NOREPINEPHRINE 4 MG/250ML-% IV SOLN
2.0000 ug/min | INTRAVENOUS | Status: DC
  Administered 2022-08-07: 2 ug/min via INTRAVENOUS
  Filled 2022-08-07: qty 250

## 2022-08-07 MED ORDER — VANCOMYCIN HCL 1500 MG/300ML IV SOLN
1500.0000 mg | Freq: Once | INTRAVENOUS | Status: AC
  Administered 2022-08-07: 1500 mg via INTRAVENOUS
  Filled 2022-08-07: qty 300
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
## 2022-08-07 NOTE — Consult Note (Signed)
PHARMACY -  BRIEF ANTIBIOTIC NOTE   Pharmacy has received consult(s) for vancomycin, cefepime from an ED provider.  The patient's profile has been reviewed for ht/wt/allergies/indication/available labs.    One time order(s) placed for cefepime 2g, vancomycin 1500 mg   Further antibiotics/pharmacy consults should be ordered by admitting physician if indicated.                       Thank you,  Celene Squibb, PharmD Clinical Pharmacist 08/07/2022 9:37 PM
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
## 2022-08-07 NOTE — Sepsis Progress Note (Signed)
Elink following code sepsis °

## 2022-08-07 NOTE — Consult Note (Signed)
CODE SEPSIS - PHARMACY COMMUNICATION  **Broad Spectrum Antibiotics should be administered within 1 hour of Sepsis diagnosis**  Time Code Sepsis Called/Page Received: 2119  Antibiotics Ordered: vancomycin, cefepime, metronidazole  Time of 1st antibiotic administration: 2144  Additional action taken by pharmacy: N/A  Celene Squibb, PharmD Clinical Pharmacist 07/19/2022 9:35 PM

## 2022-08-08 DIAGNOSIS — A419 Sepsis, unspecified organism: Secondary | ICD-10-CM | POA: Diagnosis not present

## 2022-08-08 LAB — BASIC METABOLIC PANEL WITH GFR
Anion gap: 9 (ref 5–15)
BUN: 34 mg/dL — ABNORMAL HIGH (ref 8–23)
CO2: 23 mmol/L (ref 22–32)
Calcium: 7.7 mg/dL — ABNORMAL LOW (ref 8.9–10.3)
Chloride: 105 mmol/L (ref 98–111)
Creatinine, Ser: 2.53 mg/dL — ABNORMAL HIGH (ref 0.61–1.24)
GFR, Estimated: 23 mL/min — ABNORMAL LOW (ref >60)
Glucose, Bld: 122 mg/dL — ABNORMAL HIGH (ref 70–99)
Potassium: 4.1 mmol/L (ref 3.5–5.1)
Sodium: 137 mmol/L (ref 135–145)

## 2022-08-08 LAB — BLOOD CULTURE ID PANEL (REFLEXED) - BCID2

## 2022-08-08 LAB — URINALYSIS, W/ REFLEX TO CULTURE (INFECTION SUSPECTED)
Bilirubin Urine: NEGATIVE
Glucose, UA: NEGATIVE mg/dL
Ketones, ur: NEGATIVE mg/dL
Nitrite: NEGATIVE
Protein, ur: 100 mg/dL — AB
RBC / HPF: >50 RBC/hpf (ref 0–5)
Specific Gravity, Urine: 1.016 (ref 1.005–1.030)
Squamous Epithelial / HPF: NONE SEEN /HPF (ref 0–5)
WBC, UA: >50 WBC/hpf (ref 0–5)
pH: 6 (ref 5.0–8.0)

## 2022-08-08 LAB — CBC
HCT: 28.7 % — ABNORMAL LOW (ref 39.0–52.0)
Hemoglobin: 9.6 g/dL — ABNORMAL LOW (ref 13.0–17.0)
MCH: 31.3 pg (ref 26.0–34.0)
MCHC: 33.4 g/dL (ref 30.0–36.0)
MCV: 93.5 fL (ref 80.0–100.0)
Platelets: 135 10*3/uL — ABNORMAL LOW (ref 150–400)
RBC: 3.07 MIL/uL — ABNORMAL LOW (ref 4.22–5.81)
RDW: 12.9 % (ref 11.5–15.5)
WBC: 37.1 10*3/uL — ABNORMAL HIGH (ref 4.0–10.5)
nRBC: 0 % (ref 0.0–0.2)

## 2022-08-08 LAB — HEPATIC FUNCTION PANEL
ALT: 58 U/L — ABNORMAL HIGH (ref 0–44)
AST: 101 U/L — ABNORMAL HIGH (ref 15–41)
Albumin: 2 g/dL — ABNORMAL LOW (ref 3.5–5.0)
Alkaline Phosphatase: 64 U/L (ref 38–126)
Bilirubin, Direct: 0.1 mg/dL (ref 0.0–0.2)
Indirect Bilirubin: 0.6 mg/dL (ref 0.3–0.9)
Total Bilirubin: 0.7 mg/dL (ref 0.3–1.2)
Total Protein: 4.9 g/dL — ABNORMAL LOW (ref 6.5–8.1)

## 2022-08-08 LAB — MAGNESIUM: Magnesium: 1.9 mg/dL (ref 1.7–2.4)

## 2022-08-08 LAB — GLUCOSE, CAPILLARY
Glucose-Capillary: 102 mg/dL — ABNORMAL HIGH (ref 70–99)
Glucose-Capillary: 94 mg/dL (ref 70–99)

## 2022-08-08 LAB — LACTIC ACID, PLASMA
Lactic Acid, Venous: 5.2 mmol/L (ref 0.5–1.9)
Lactic Acid, Venous: 8.6 mmol/L (ref 0.5–1.9)

## 2022-08-08 LAB — PHOSPHORUS: Phosphorus: 1.4 mg/dL — ABNORMAL LOW (ref 2.5–4.6)

## 2022-08-08 LAB — MRSA NEXT GEN BY PCR, NASAL: MRSA by PCR Next Gen: NOT DETECTED

## 2022-08-08 MED ORDER — HYDROMORPHONE HCL 1 MG/ML IJ SOLN
1.0000 mg | INTRAMUSCULAR | Status: DC | PRN
  Administered 2022-08-08 (×3): 1 mg via INTRAVENOUS
  Filled 2022-08-08 (×3): qty 1

## 2022-08-08 MED ORDER — MORPHINE SULFATE (PF) 2 MG/ML IV SOLN
2.0000 mg | INTRAVENOUS | Status: DC | PRN
  Administered 2022-08-08: 2 mg via INTRAVENOUS
  Filled 2022-08-08: qty 1

## 2022-08-08 MED ORDER — ACETAMINOPHEN 325 MG PO TABS: 650.0000 mg | ORAL_TABLET | Freq: Four times a day (QID) | ORAL | Status: DC | PRN

## 2022-08-08 MED ORDER — SODIUM PHOSPHATES 45 MMOLE/15ML IV SOLN
15.0000 mmol | Freq: Once | INTRAVENOUS | Status: DC
  Filled 2022-08-08: qty 5

## 2022-08-08 MED ORDER — SODIUM BICARBONATE 8.4 % IV SOLN
50.0000 meq | Freq: Once | INTRAVENOUS | Status: AC
  Administered 2022-08-08: 50 meq via INTRAVENOUS
  Filled 2022-08-08: qty 50

## 2022-08-08 MED ORDER — POLYVINYL ALCOHOL 1.4 % OP SOLN: 1.0000 [drp] | Freq: Four times a day (QID) | OPHTHALMIC | Status: DC | PRN

## 2022-08-08 MED ORDER — GLYCOPYRROLATE 1 MG PO TABS: 1.0000 mg | ORAL_TABLET | ORAL | Status: DC | PRN

## 2022-08-08 MED ORDER — MIDAZOLAM HCL 2 MG/2ML IJ SOLN: 2.0000 mg | INTRAMUSCULAR | Status: DC | PRN

## 2022-08-08 MED ORDER — HYDROCORTISONE SOD SUC (PF) 100 MG IJ SOLR
100.0000 mg | Freq: Two times a day (BID) | INTRAMUSCULAR | Status: DC
  Filled 2022-08-08: qty 2

## 2022-08-08 MED ORDER — ACETAMINOPHEN 650 MG RE SUPP: 650.0000 mg | Freq: Four times a day (QID) | RECTAL | Status: DC | PRN

## 2022-08-08 MED ORDER — LACTATED RINGERS IV BOLUS
1000.0000 mL | Freq: Once | INTRAVENOUS | Status: AC
  Administered 2022-08-08: 1000 mL via INTRAVENOUS

## 2022-08-08 MED ORDER — MORPHINE SULFATE (PF) 2 MG/ML IV SOLN
2.0000 mg | INTRAVENOUS | Status: DC | PRN
  Administered 2022-08-08 (×2): 2 mg via INTRAVENOUS
  Filled 2022-08-08 (×2): qty 1

## 2022-08-08 MED ORDER — SODIUM CHLORIDE 0.9 % IV SOLN: 2.0000 g | INTRAVENOUS | Status: DC

## 2022-08-08 MED ORDER — NOREPINEPHRINE 4 MG/250ML-% IV SOLN
0.0000 ug/min | INTRAVENOUS | Status: DC
  Administered 2022-08-08 (×2): 15 ug/min via INTRAVENOUS
  Filled 2022-08-08 (×2): qty 250

## 2022-08-08 MED ORDER — SODIUM CHLORIDE 0.9 % IV SOLN: INTRAVENOUS | Status: DC

## 2022-08-08 MED ORDER — GLYCOPYRROLATE 0.2 MG/ML IJ SOLN: 0.2000 mg | INTRAMUSCULAR | Status: DC | PRN

## 2022-08-08 MED ORDER — HYDROMORPHONE BOLUS VIA INFUSION: 1.0000 mg | INTRAVENOUS | Status: DC | PRN

## 2022-08-08 MED ORDER — MORPHINE SULFATE (PF) 2 MG/ML IV SOLN: 2.0000 mg | INTRAVENOUS | Status: DC | PRN

## 2022-08-08 MED ORDER — GLYCOPYRROLATE 0.2 MG/ML IJ SOLN
0.2000 mg | INTRAMUSCULAR | Status: DC | PRN
  Administered 2022-08-08: 0.2 mg via INTRAVENOUS
  Filled 2022-08-08: qty 1

## 2022-08-10 LAB — CULTURE, BLOOD (ROUTINE X 2)

## 2022-08-10 LAB — URINE CULTURE: Culture: >=100000 — AB

## 2022-08-11 LAB — CULTURE, BLOOD (ROUTINE X 2): Special Requests: ADEQUATE
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
## 2022-08-12 NOTE — IPAL (Signed)
  Interdisciplinary Goals of Care Family Meeting   Date carried out: 07/30/2022  Location of the meeting: Bedside  Member's involved: Physician, Bedside Registered Nurse, and Family Member or next of kin      GOALS OF CARE DISCUSSION  The Clinical status was relayed to family in detail- GrandDaughter POA Judeth Cornfield  Updated and notified of patients medical condition- Patient with increased WOB and using accessory muscles to breathe Explained to family course of therapy and the modalities   Patient with Progressive multiorgan failure with a very high probablity of a very minimal chance of meaningful recovery despite all aggressive and optimal medical therapy.  PATIENT REMAINS DNR/DNI  She understands the situation.  She has  consented and agreed to DNR/DNI and would like to proceed with Comfort care measures when all family arrives  Will initiate end of life visitation  Family are satisfied with Plan of action and management. All questions answered  Additional CC time 37 mins   Raymel Cull Santiago Glad, M.D.  [facility]  Medical Director [facility] Medical Director [facility] Cardio-Pulmonary Department

## 2022-08-12 NOTE — Sepsis Progress Note (Signed)
Contacted bedside RN to ask current provider about additional fluids or repeat lactic acid. There was an increase in lactic acid to 8.6. Will continue to follow code sepsis.
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
## 2022-08-12 NOTE — Progress Notes (Signed)
Was called back as patient had passed. Family asked for prayer. Offered prayer and compassionate presence to family. Assisted in retrieving funeral home information from family for nurse.
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
## 2022-08-12 NOTE — Plan of Care (Signed)
Met with family, they have confirmed and would like to proceed to comfort care measures

## 2022-08-12 NOTE — Progress Notes (Signed)
Visited with family, introduced myself and chaplain services. Family just agreed to move to comfort care, offered my support and let them know I was available when and if they needed me. I will round back later this evening.
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
## 2022-08-12 DEATH — deceased

## 2022-08-27 ENCOUNTER — Inpatient Hospital Stay: Payer: Medicare Other

## 2022-11-19 ENCOUNTER — Inpatient Hospital Stay: Payer: Medicare Other

## 2022-11-26 ENCOUNTER — Other Ambulatory Visit: Payer: Medicare Other

## 2022-11-26 IMAGING — CT CT CHEST-ABD-PELV W/ CM
3 of 5 series · 14 of 36 positions shown, 15 images · IV contrast (omnipaque)
Comparison: 09/21/2019

CLINICAL DATA: Restaging of metastatic melanoma. Receiving
Keytruda, currently on cycle 9.

EXAM:
CT CHEST, ABDOMEN, AND PELVIS WITH CONTRAST
TECHNIQUE: Multidetector CT imaging of the chest, abdomen and pelvis was
performed following the standard protocol during bolus
administration of intravenous contrast.
CONTRAST:  75mL OMNIPAQUE IOHEXOL 300 MG/ML  SOLN

[Series 2: axials cap 5.00 · axial · 0.75mm/px · z∈[-1479,-954]mm · 8 of 137 slices shown, 9 images]
[im 16/137  mediastinal]
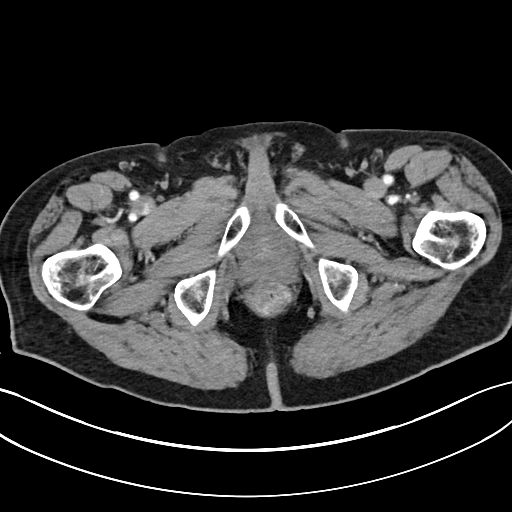
[im 16/137  bone]
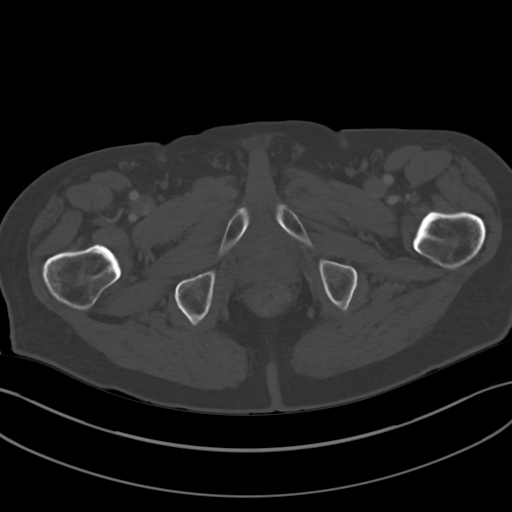
[im 31/137  mediastinal]
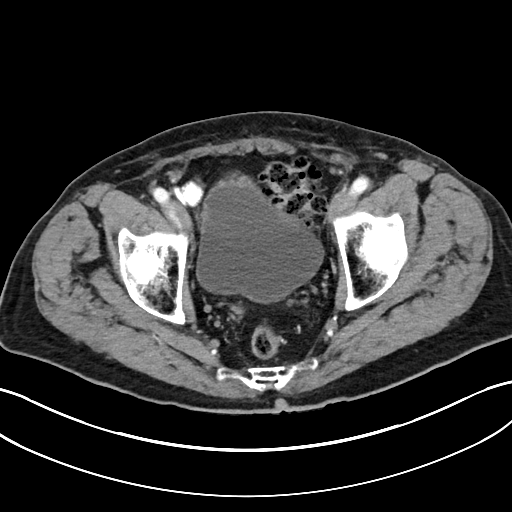
[im 46/137  mediastinal]
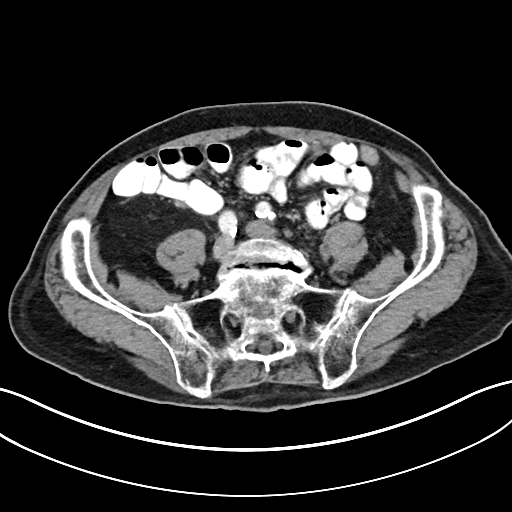
[im 61/137  mediastinal]
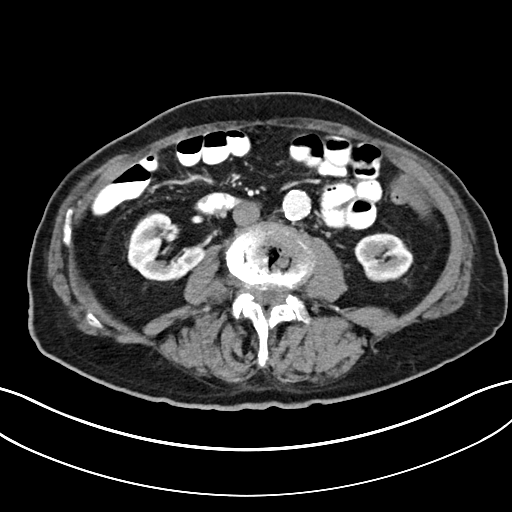
[im 76/137  mediastinal]
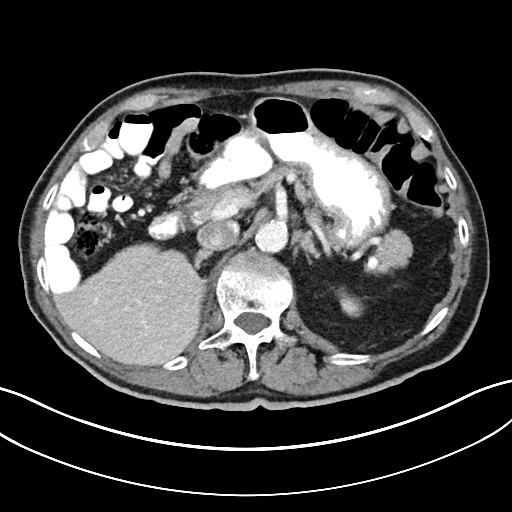
[im 91/137  mediastinal]
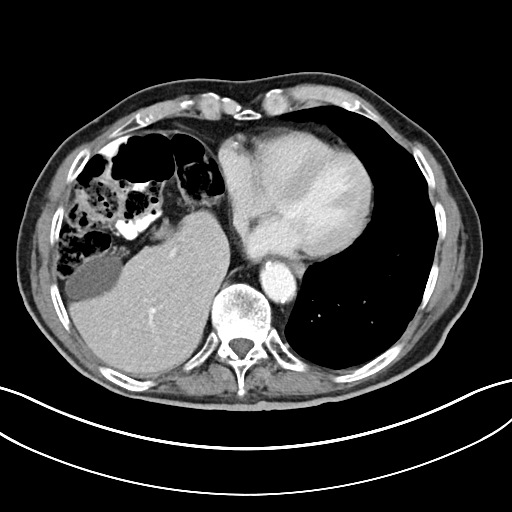
[im 106/137  mediastinal]
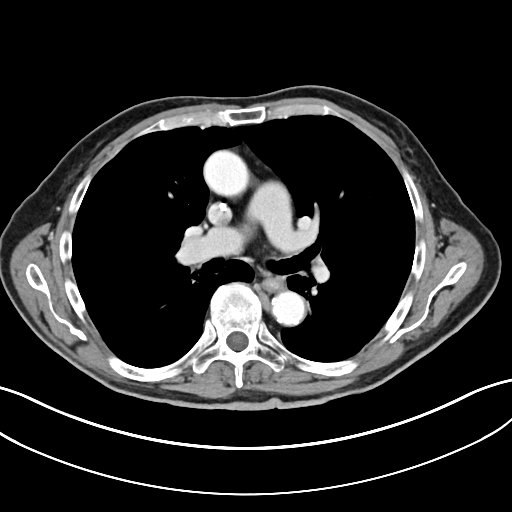
[im 121/137  mediastinal]
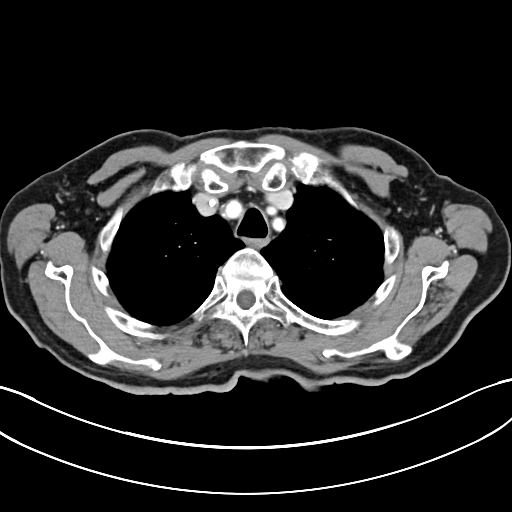

[Series 3: lungs cap 2.00 · axial · 0.74mm/px · z∈[-1162,-1084]mm · 3 of 159 slices shown]
[im 14/159  mediastinal]
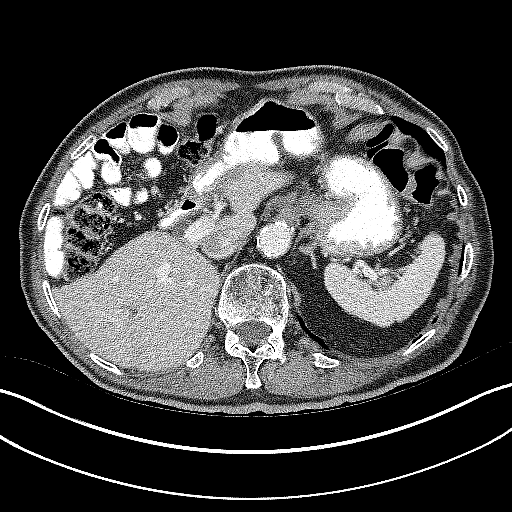
[im 40/159  mediastinal]
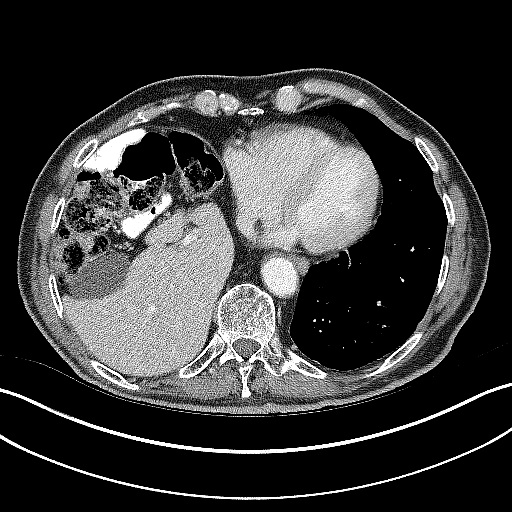
[im 53/159  mediastinal]
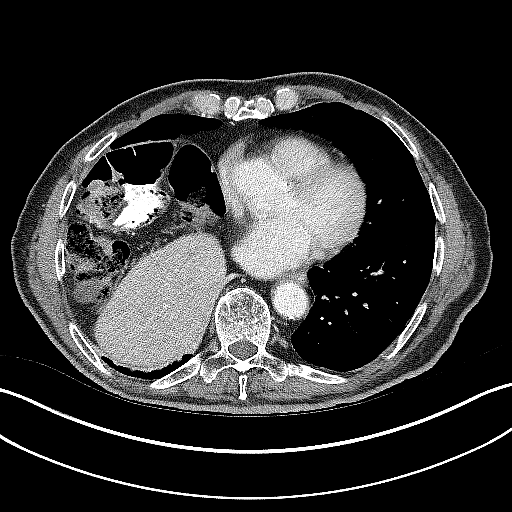

[Series 4: coronals cap 2.00 cor · coronal · 0.75mm/px · 3 of 133 slices shown]
[im 27/133  mediastinal]
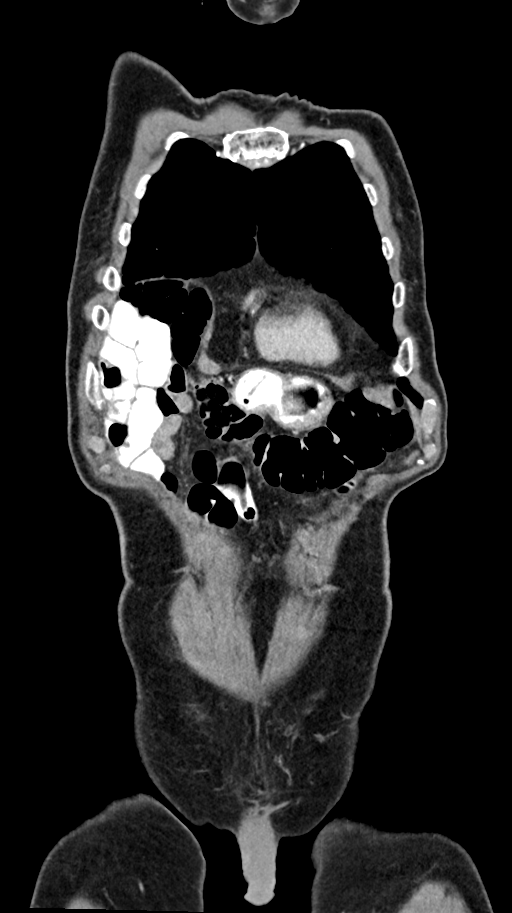
[im 53/133  mediastinal]
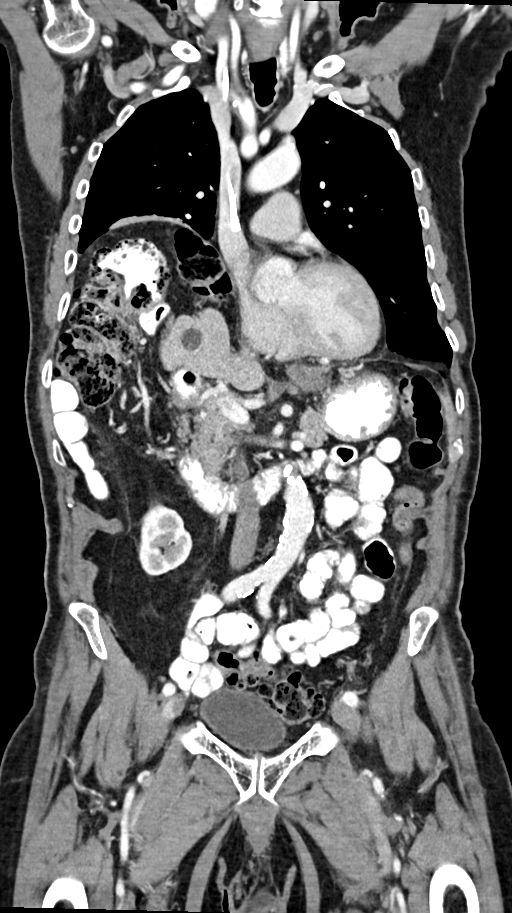
[im 80/133  mediastinal]
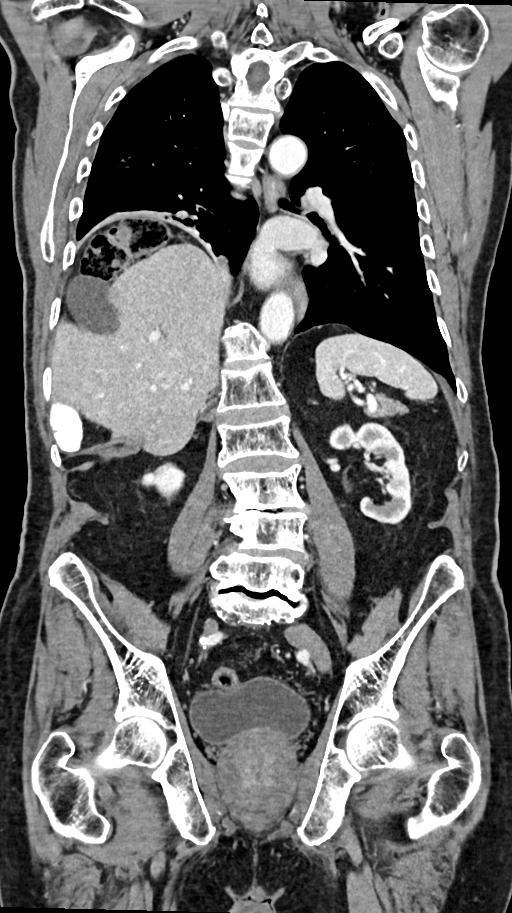

[14 of 36 positions shown; findings below may reference images not displayed]

FINDINGS: CT CHEST FINDINGS

Cardiovascular: Right Port-A-Cath tip: SVC. Coronary, aortic arch,
and branch vessel atherosclerotic vascular disease.

Mediastinum/Nodes: No pathologic adenopathy observed.

Lungs/Pleura: Elevated right hemidiaphragm with adjacent atelectasis
similar to the prior exam.

[DATE] by 0.4 cm right upper lobe nodule on image 62 series 3 was by my
measurements previously 0.9 by 0.6 cm.

Medial right lower lobe nodule along the diaphragmatic margin and
measures 1.6 by 1.1 cm on image 99 of series 3, and by my
measurements was previously 1.5 by 1.2 cm, essentially stable.

No new lung nodules are identified.

Musculoskeletal: Stable compression fractures at T9 and T12. Stable
deformity of the sternal manubrium from an old healed fracture.

CT ABDOMEN PELVIS FINDINGS

Hepatobiliary: A lesion posteriorly in the right hepatic lobe with
mild capsular retraction measures 1.8 by 0.9 cm on image 58 of
series 2 and was previously 2.0 by 1.1 cm by my measurements,
minimally improved.

A bilobed lesion in the right hepatic lobe measuring 1.1 by 1.0 cm
on image 59 of series 2 previously measured 1.8 by 1.2 cm. Other
small lesions are similar. A left hepatic lobe cyst appears similar.
Gallbladder unremarkable.

Pancreas: Unremarkable

Spleen: Unremarkable

Adrenals/Urinary Tract: Stable right kidney lower pole cyst.
Prostate gland indents the bladder base. Adrenal glands
unremarkable. Small infolding is a long the anterior margin of the
urinary bladder likely account for the slightly nodular appearance.

Stomach/Bowel: Sigmoid colon diverticulosis. Mobile cecum in the
right upper quadrant.

Vascular/Lymphatic: Aortoiliac atherosclerotic vascular disease. No
pathologic adenopathy.

Reproductive: Stable prostatomegaly associated heterogeneous
enhancement.

Other: No supplemental non-categorized findings.

Musculoskeletal: Multilevel compression fractures appear unchanged.
Chronic pars defects at L5 with grade 2 anterolisthesis and
resulting bilateral, right greater than left foraminal stenosis.
Lumbar spondylosis and degenerative disc disease.
IMPRESSION: 1. Mild size reduction in the right hepatic lobe lesions and in one
of the pulmonary nodules.
2. Other imaging findings of potential clinical significance:
Coronary atherosclerosis. Elevated right hemidiaphragm with adjacent
atelectasis. Sigmoid colon diverticulosis. Stable prostatomegaly
associated heterogeneous enhancement. Chronic pars defects at L5
with grade 2 anterolisthesis and resulting bilateral, right greater
than left foraminal stenosis. Multilevel compression fractures at T9
and T12 appear unchanged. Lumbar spondylosis and degenerative disc
disease.
3. Aortic atherosclerosis.

Aortic Atherosclerosis ([id number]).

## 2022-12-01 ENCOUNTER — Ambulatory Visit: Payer: Medicare Other | Admitting: Oncology

## 2022-12-01 ENCOUNTER — Other Ambulatory Visit: Payer: Medicare Other

## 2023-04-05 IMAGING — CT CT CHEST-ABD-PELV W/ CM
2 of 5 series · 13 of 36 positions shown, 15 images · IV contrast (omnipaque)
Comparison: 12/31/2019

CLINICAL DATA: Stage IV metastatic melanoma with lung and liver
metastasis. On immunotherapy. Asymptomatic.

EXAM:
CT CHEST, ABDOMEN, AND PELVIS WITH CONTRAST
TECHNIQUE: Multidetector CT imaging of the chest, abdomen and pelvis was
performed following the standard protocol during bolus
administration of intravenous contrast.
CONTRAST:  80mL OMNIPAQUE IOHEXOL 300 MG/ML  SOLN

[Series 2: cap with · axial · 0.86mm/px · z∈[-668,-98]mm · 10 of 140 slices shown, 12 images]
[im 13/140  mediastinal]
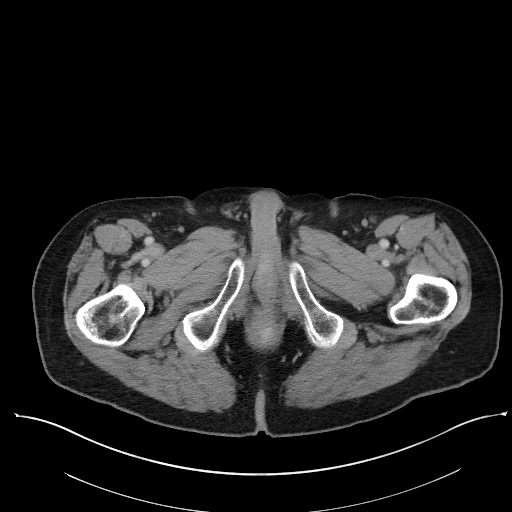
[im 13/140  bone]
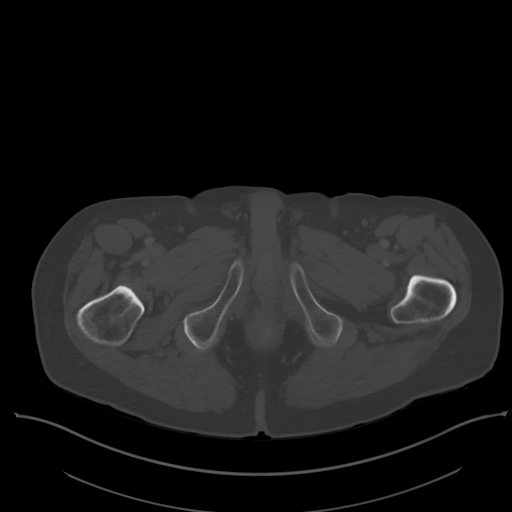
[im 26/140  mediastinal]
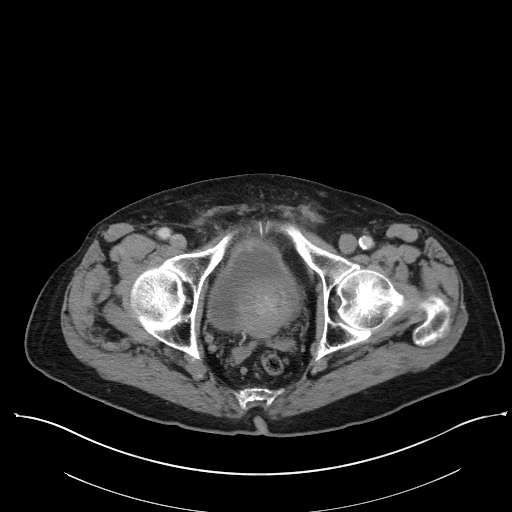
[im 38/140  mediastinal]
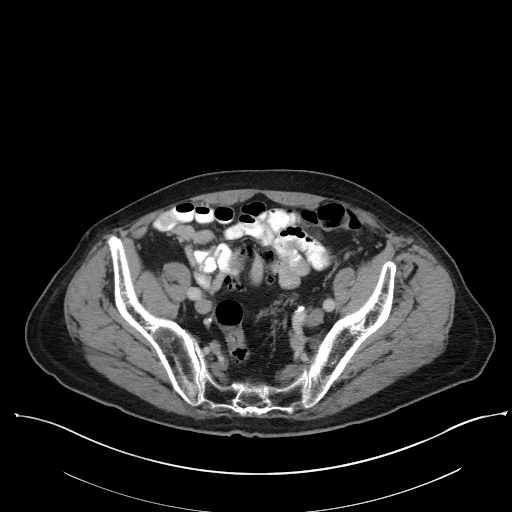
[im 51/140  mediastinal]
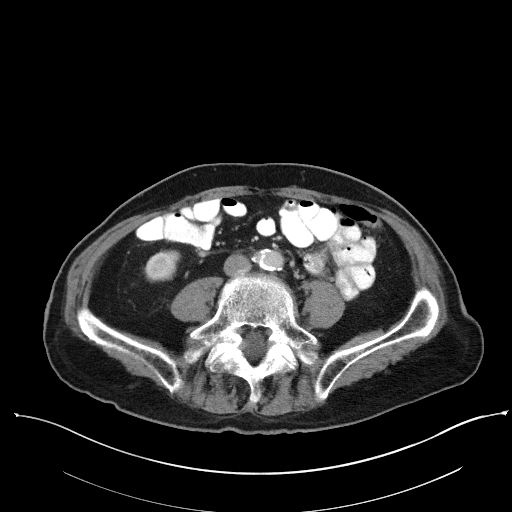
[im 64/140  mediastinal]
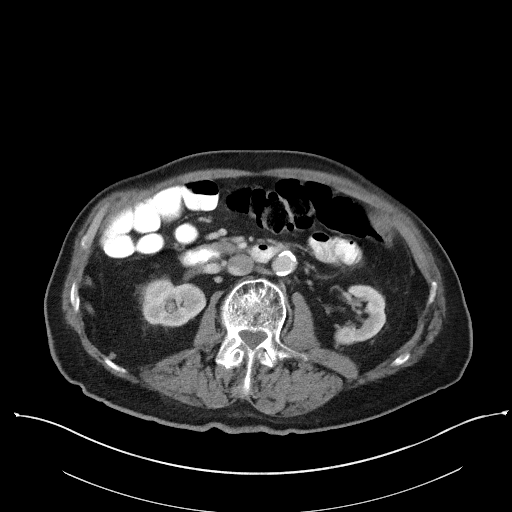
[im 76/140  mediastinal]
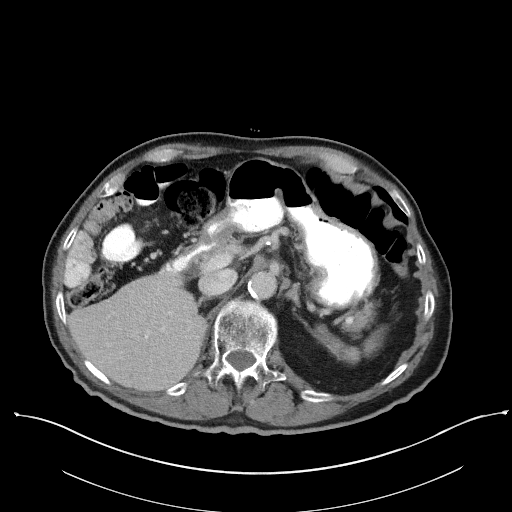
[im 89/140  mediastinal]
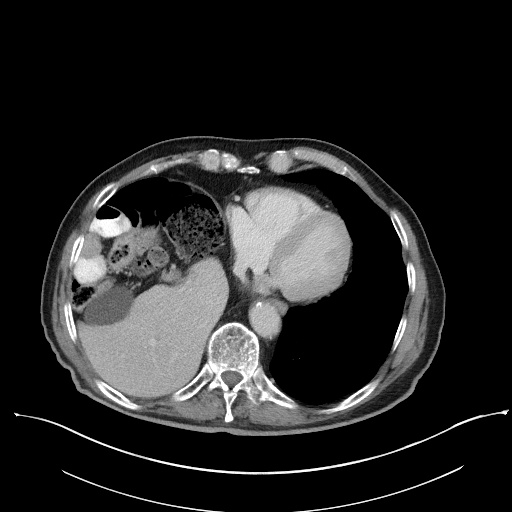
[im 102/140  mediastinal]
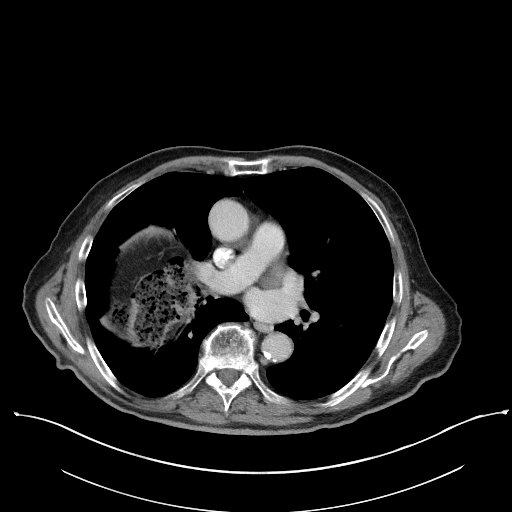
[im 114/140  mediastinal]
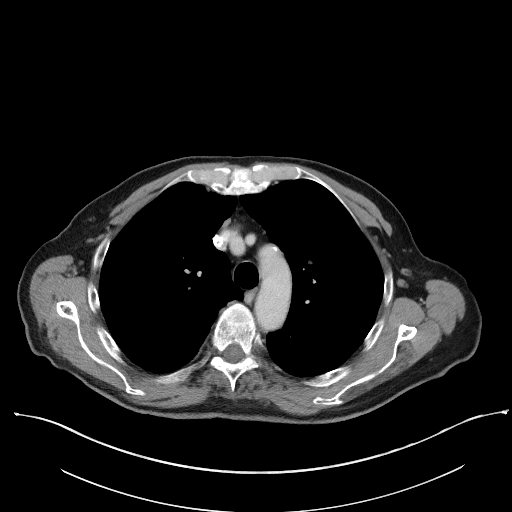
[im 114/140  bone]
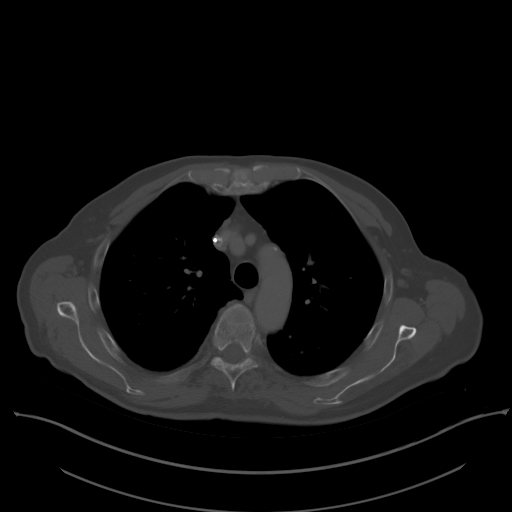
[im 127/140  mediastinal]
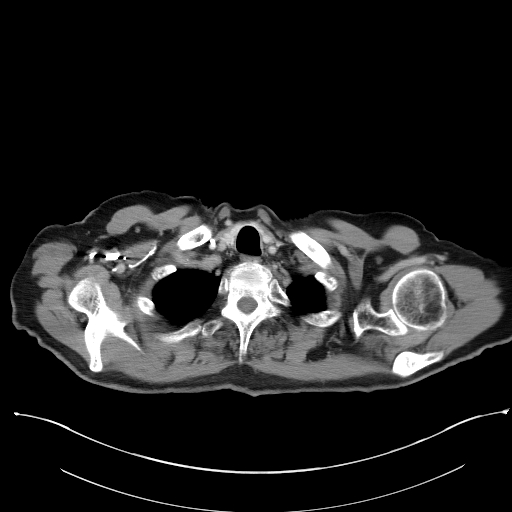

[Series 5: coronals · coronal · 0.89mm/px · 3 of 137 slices shown]
[im 28/137  mediastinal]
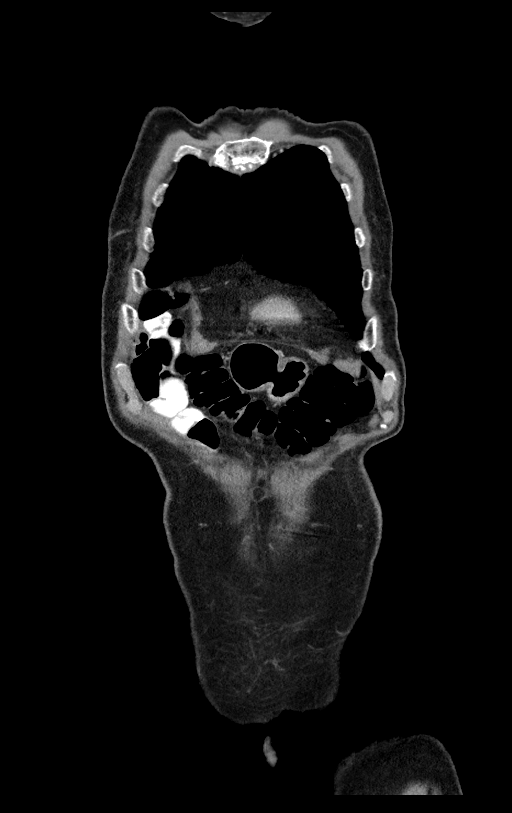
[im 55/137  mediastinal]
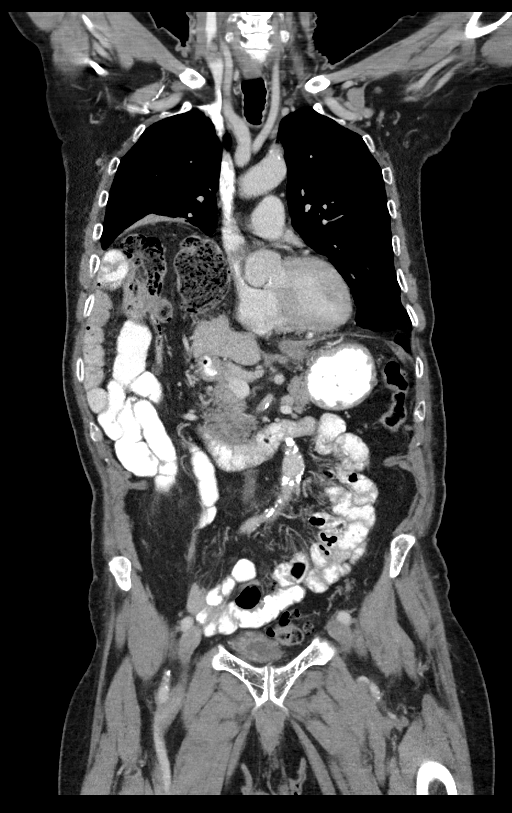
[im 82/137  mediastinal]
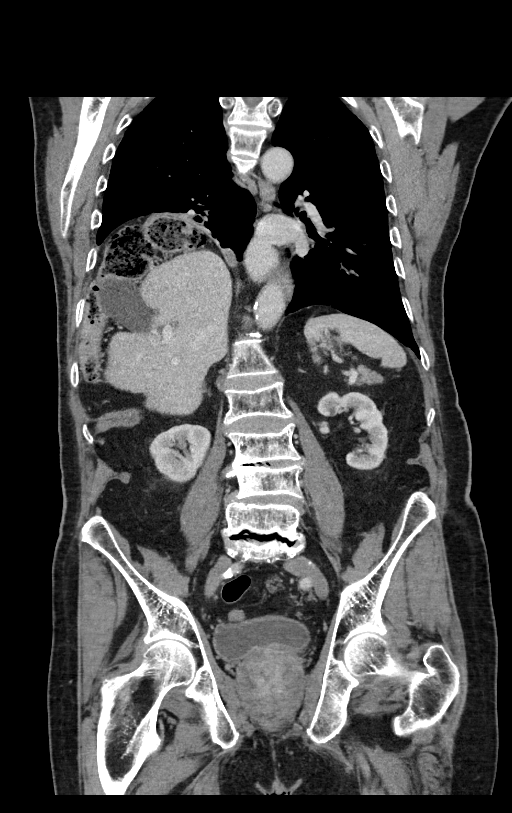

[13 of 36 positions shown; findings below may reference images not displayed]

FINDINGS: CT CHEST FINDINGS

Cardiovascular: Right Port-A-Cath tip at low SVC. Normal heart size,
without pericardial effusion. Aortic atherosclerosis. No central
pulmonary embolism, on this non-dedicated study.

Mediastinum/Nodes: No supraclavicular adenopathy. No mediastinal or
hilar adenopathy.

Lungs/Pleura: No pleural fluid. Similar marked right hemidiaphragm
elevation.

Volume loss at the right lung base.

2 mm medial right apical pleural-based nodular density is similar on
43/4.

Posterior right upper lobe pulmonary nodule is less well-defined
today. On the order of 5 mm maximally on 71/4 versus 8 x 4 mm on the
prior exam.

Medial right lower lobe pulmonary nodule measures 1.3 x 1.1 cm on
108/4 versus 1.6 x 1.1 cm on the prior exam.

No new or enlarging pulmonary nodules identified.

Musculoskeletal: Thoracic compression deformities, most significant
at T9 and T12, similar.

CT ABDOMEN PELVIS FINDINGS

Hepatobiliary: Index subcapsular right hepatic lobe hypoattenuating
lesion is slightly less well-defined today. On the order of 1.5 x
0.9 cm on 62/2 versus 1.8 x 0.9 cm on the prior.

Central and caudal to this is a right hepatic lobe lesion which
nearly has resolved. Example at 6 mm on 63/2 versus 11 x 10 mm on
the prior exam.

No new or enlarging liver lesions. A segment 4A cyst is again
identified.

Normal gallbladder, without biliary ductal dilatation.

Pancreas: Normal, without mass or ductal dilatation.

Spleen: Normal in size, without focal abnormality.

Adrenals/Urinary Tract: Normal adrenal glands. Expected renal
cortical thinning for age. An interpolar right renal 2.2 cm
low-density lesion is likely a cyst. No hydronephrosis. Bladder
saccules in the setting of prostatomegaly.

Stomach/Bowel: Normal stomach, without wall thickening. Extensive
colonic diverticulosis. Normal terminal ileum and appendix. Normal
small bowel.

Vascular/Lymphatic: Aortic atherosclerosis. No abdominopelvic
adenopathy.

Reproductive: Moderate to marked prostatomegaly.

Other: No significant free fluid. No evidence of omental or
peritoneal disease.

Musculoskeletal: Osteopenia. Bilateral L5 pars defects. Grade 1
L5-S1 anterolisthesis. Lumbar compression deformities are similar,
without gross ventral canal encroachment.
IMPRESSION: 1. Response to therapy of hepatic and liver metastasis.
2. No new or progressive disease.
3. Prostatomegaly with bladder outlet obstruction.
4. Chronic marked right hemidiaphragm elevation.
5.  Aortic Atherosclerosis ([id number]).
